# Patient Record
Sex: Male | Born: 1945 | Race: White | Hispanic: No | Smoking: Current every day smoker
Health system: Southern US, Community
[De-identification: ages and names within clinical notes are randomized; demographics above are authoritative.]

## PROBLEM LIST (undated history)

## (undated) DIAGNOSIS — J449 Chronic obstructive pulmonary disease, unspecified: Secondary | ICD-10-CM

## (undated) HISTORY — PX: HAND SURGERY: SHX662

---

## 1999-10-29 ENCOUNTER — Ambulatory Visit (HOSPITAL_BASED_OUTPATIENT_CLINIC_OR_DEPARTMENT_OTHER): Admission: RE | Admit: 1999-10-29 | Discharge: 1999-10-29 | Payer: Self-pay | Admitting: Orthopedic Surgery

## 2001-06-19 ENCOUNTER — Ambulatory Visit (HOSPITAL_COMMUNITY): Admission: RE | Admit: 2001-06-19 | Discharge: 2001-06-19 | Payer: Self-pay | Admitting: Family Medicine

## 2001-06-19 ENCOUNTER — Encounter: Payer: Self-pay | Admitting: Family Medicine

## 2011-11-18 ENCOUNTER — Institutional Professional Consult (permissible substitution): Payer: Self-pay | Admitting: Pulmonary Disease

## 2016-03-18 ENCOUNTER — Encounter (HOSPITAL_COMMUNITY): Payer: Self-pay | Admitting: *Deleted

## 2016-03-18 ENCOUNTER — Emergency Department (HOSPITAL_COMMUNITY)
Admission: EM | Admit: 2016-03-18 | Discharge: 2016-03-18 | Discharge status: Against Medical Advice | Payer: Medicare Other | Attending: Emergency Medicine | Admitting: Emergency Medicine

## 2016-03-18 ENCOUNTER — Emergency Department (HOSPITAL_COMMUNITY): Payer: Medicare Other

## 2016-03-18 DIAGNOSIS — R000 Tachycardia, unspecified: Secondary | ICD-10-CM | POA: Insufficient documentation

## 2016-03-18 DIAGNOSIS — Z5321 Procedure and treatment not carried out due to patient leaving prior to being seen by health care provider: Secondary | ICD-10-CM | POA: Diagnosis not present

## 2016-03-18 DIAGNOSIS — Z5320 Procedure and treatment not carried out because of patient's decision for unspecified reasons: Secondary | ICD-10-CM

## 2016-03-18 DIAGNOSIS — F17200 Nicotine dependence, unspecified, uncomplicated: Secondary | ICD-10-CM | POA: Diagnosis not present

## 2016-03-18 DIAGNOSIS — Z7951 Long term (current) use of inhaled steroids: Secondary | ICD-10-CM | POA: Diagnosis not present

## 2016-03-18 DIAGNOSIS — J441 Chronic obstructive pulmonary disease with (acute) exacerbation: Secondary | ICD-10-CM | POA: Diagnosis not present

## 2016-03-18 DIAGNOSIS — Z5329 Procedure and treatment not carried out because of patient's decision for other reasons: Secondary | ICD-10-CM

## 2016-03-18 DIAGNOSIS — E872 Acidosis, unspecified: Secondary | ICD-10-CM

## 2016-03-18 DIAGNOSIS — R0602 Shortness of breath: Secondary | ICD-10-CM | POA: Diagnosis present

## 2016-03-18 DIAGNOSIS — R0603 Acute respiratory distress: Secondary | ICD-10-CM

## 2016-03-18 DIAGNOSIS — Z79899 Other long term (current) drug therapy: Secondary | ICD-10-CM | POA: Insufficient documentation

## 2016-03-18 DIAGNOSIS — J705 Respiratory conditions due to smoke inhalation: Secondary | ICD-10-CM

## 2016-03-18 DIAGNOSIS — T59811A Toxic effect of smoke, accidental (unintentional), initial encounter: Secondary | ICD-10-CM

## 2016-03-18 HISTORY — DX: Chronic obstructive pulmonary disease, unspecified: J44.9

## 2016-03-18 LAB — PREPARE FRESH FROZEN PLASMA
UNIT DIVISION: 0
Unit division: 0

## 2016-03-18 LAB — COMPREHENSIVE METABOLIC PANEL
ALBUMIN: 4 g/dL (ref 3.5–5.0)
ALK PHOS: 109 U/L (ref 38–126)
ALT: 31 U/L (ref 17–63)
ANION GAP: 13 (ref 5–15)
AST: 24 U/L (ref 15–41)
BUN: 19 mg/dL (ref 6–20)
CALCIUM: 9.1 mg/dL (ref 8.9–10.3)
CO2: 23 mmol/L (ref 22–32)
Chloride: 103 mmol/L (ref 101–111)
Creatinine, Ser: 0.9 mg/dL (ref 0.61–1.24)
GFR calc Af Amer: >60 mL/min (ref >60)
GFR calc non Af Amer: >60 mL/min (ref >60)
GLUCOSE: 127 mg/dL — AB (ref 65–99)
Potassium: 4.4 mmol/L (ref 3.5–5.1)
SODIUM: 139 mmol/L (ref 135–145)
Total Bilirubin: 0.7 mg/dL (ref 0.3–1.2)
Total Protein: 7.7 g/dL (ref 6.5–8.1)

## 2016-03-18 LAB — TYPE AND SCREEN
ABO/RH(D): O NEG
ANTIBODY SCREEN: NEGATIVE
UNIT DIVISION: 0
Unit division: 0

## 2016-03-18 LAB — CBC WITH DIFFERENTIAL/PLATELET
BASOS PCT: 0 %
Basophils Absolute: 0 10*3/uL (ref 0.0–0.1)
EOS ABS: 0.7 10*3/uL (ref 0.0–0.7)
Eosinophils Relative: 7 %
HCT: 51.1 % (ref 39.0–52.0)
HEMOGLOBIN: 16.9 g/dL (ref 13.0–17.0)
Lymphocytes Relative: 24 %
Lymphs Abs: 2.4 10*3/uL (ref 0.7–4.0)
MCH: 33.7 pg (ref 26.0–34.0)
MCHC: 33.1 g/dL (ref 30.0–36.0)
MCV: 101.8 fL — ABNORMAL HIGH (ref 78.0–100.0)
Monocytes Absolute: 1 10*3/uL (ref 0.1–1.0)
Monocytes Relative: 10 %
NEUTROS PCT: 59 %
Neutro Abs: 6.2 10*3/uL (ref 1.7–7.7)
Platelets: 269 10*3/uL (ref 150–400)
RBC: 5.02 MIL/uL (ref 4.22–5.81)
RDW: 14.2 % (ref 11.5–15.5)
WBC: 10.3 10*3/uL (ref 4.0–10.5)

## 2016-03-18 LAB — CARBOXYHEMOGLOBIN
CARBOXYHEMOGLOBIN: 7.1 % — AB (ref 0.5–1.5)
Methemoglobin: 0.9 % (ref 0.0–1.5)
O2 SAT: 90.7 %
Total hemoglobin: 16.8 g/dL (ref 13.5–18.0)

## 2016-03-18 LAB — I-STAT CG4 LACTIC ACID, ED: Lactic Acid, Venous: 2.6 mmol/L (ref 0.5–2.0)

## 2016-03-18 LAB — ABO/RH: ABO/RH(D): O NEG

## 2016-03-18 MED ORDER — DOXYCYCLINE HYCLATE 100 MG PO CAPS: 100.0000 mg | ORAL_CAPSULE | Freq: Two times a day (BID) | ORAL | Status: DC

## 2016-03-18 MED ORDER — ALBUTEROL (5 MG/ML) CONTINUOUS INHALATION SOLN
10.0000 mg/h | INHALATION_SOLUTION | Freq: Once | RESPIRATORY_TRACT | Status: AC
  Administered 2016-03-18: 10 mg/h via RESPIRATORY_TRACT
  Filled 2016-03-18: qty 20

## 2016-03-18 MED ORDER — MAGNESIUM SULFATE 2 GM/50ML IV SOLN
2.0000 g | Freq: Once | INTRAVENOUS | Status: AC
  Administered 2016-03-18: 2 g via INTRAVENOUS
  Filled 2016-03-18: qty 50

## 2016-03-18 MED ORDER — SODIUM CHLORIDE 0.9 % IV BOLUS (SEPSIS)
1000.0000 mL | Freq: Once | INTRAVENOUS | Status: AC
  Administered 2016-03-18: 1000 mL via INTRAVENOUS

## 2016-03-18 MED ORDER — PREDNISONE 20 MG PO TABS: 60.0000 mg | ORAL_TABLET | Freq: Every day | ORAL | Status: DC

## 2016-03-18 MED ORDER — ALBUTEROL SULFATE HFA 108 (90 BASE) MCG/ACT IN AERS
2.0000 | INHALATION_SPRAY | Freq: Once | RESPIRATORY_TRACT | Status: AC
Start: 2016-03-18 — End: 2016-03-18
  Administered 2016-03-18: 2 via RESPIRATORY_TRACT
  Filled 2016-03-18: qty 6.7

## 2016-03-18 MED ORDER — DOXYCYCLINE HYCLATE 100 MG PO TABS
100.0000 mg | ORAL_TABLET | Freq: Once | ORAL | Status: AC
  Administered 2016-03-18: 100 mg via ORAL
  Filled 2016-03-18: qty 1

## 2016-03-18 MED ORDER — IPRATROPIUM BROMIDE 0.02 % IN SOLN
1.5000 mg | Freq: Once | RESPIRATORY_TRACT | Status: AC
  Administered 2016-03-18: 1.5 mg via RESPIRATORY_TRACT
  Filled 2016-03-18: qty 7.5

## 2016-03-18 MED ORDER — METHYLPREDNISOLONE SODIUM SUCC 125 MG IJ SOLR
125.0000 mg | Freq: Once | INTRAMUSCULAR | Status: AC
  Administered 2016-03-18: 125 mg via INTRAVENOUS
  Filled 2016-03-18: qty 2

## 2016-03-18 MED ORDER — ALBUTEROL SULFATE HFA 108 (90 BASE) MCG/ACT IN AERS: 1.0000 | INHALATION_SPRAY | Freq: Four times a day (QID) | RESPIRATORY_TRACT | Status: DC | PRN

## 2016-03-18 MED ORDER — ALBUTEROL SULFATE (2.5 MG/3ML) 0.083% IN NEBU
5.0000 mg | INHALATION_SOLUTION | Freq: Once | RESPIRATORY_TRACT | Status: AC
  Administered 2016-03-18: 5 mg via RESPIRATORY_TRACT
  Filled 2016-03-18: qty 6

## 2016-03-18 NOTE — ED Notes (Signed)
Patient stated he was ready to go.  Dr Bland Span notified and went to talk with the patient   Patient instructed this nurse to call to call D R Tamala Julian to come get him
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
## 2016-03-18 NOTE — Progress Notes (Signed)
Removed patient from BiPAP per Dr. Bland Span.
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
## 2016-03-18 NOTE — ED Notes (Signed)
Dr Ellender Hose aware of critical carboxyhgb. 7.1

## 2017-01-01 ENCOUNTER — Encounter (HOSPITAL_COMMUNITY): Payer: Self-pay | Admitting: Emergency Medicine

## 2017-01-01 ENCOUNTER — Emergency Department (HOSPITAL_COMMUNITY): Payer: Medicare Other

## 2017-01-01 ENCOUNTER — Emergency Department (HOSPITAL_COMMUNITY)
Admission: EM | Admit: 2017-01-01 | Discharge: 2017-01-01 | Disposition: A | Discharge status: Home / Self Care | Payer: Medicare Other | Attending: Emergency Medicine | Admitting: Emergency Medicine

## 2017-01-01 DIAGNOSIS — J440 Chronic obstructive pulmonary disease with acute lower respiratory infection: Secondary | ICD-10-CM | POA: Insufficient documentation

## 2017-01-01 DIAGNOSIS — Z79899 Other long term (current) drug therapy: Secondary | ICD-10-CM | POA: Insufficient documentation

## 2017-01-01 DIAGNOSIS — R0902 Hypoxemia: Secondary | ICD-10-CM | POA: Diagnosis not present

## 2017-01-01 DIAGNOSIS — F17210 Nicotine dependence, cigarettes, uncomplicated: Secondary | ICD-10-CM | POA: Diagnosis not present

## 2017-01-01 DIAGNOSIS — R0602 Shortness of breath: Secondary | ICD-10-CM

## 2017-01-01 DIAGNOSIS — J209 Acute bronchitis, unspecified: Secondary | ICD-10-CM

## 2017-01-01 LAB — BASIC METABOLIC PANEL
ANION GAP: 8 (ref 5–15)
BUN: 19 mg/dL (ref 6–20)
CALCIUM: 9.3 mg/dL (ref 8.9–10.3)
CHLORIDE: 102 mmol/L (ref 101–111)
CO2: 25 mmol/L (ref 22–32)
CREATININE: 0.77 mg/dL (ref 0.61–1.24)
GFR calc non Af Amer: >60 mL/min (ref >60)
Glucose, Bld: 115 mg/dL — ABNORMAL HIGH (ref 65–99)
Potassium: 4.6 mmol/L (ref 3.5–5.1)
SODIUM: 135 mmol/L (ref 135–145)

## 2017-01-01 LAB — CBC WITH DIFFERENTIAL/PLATELET
BASOS ABS: 0 10*3/uL (ref 0.0–0.1)
BASOS PCT: 1 %
EOS ABS: 0.5 10*3/uL (ref 0.0–0.7)
Eosinophils Relative: 7 %
HCT: 51.6 % (ref 39.0–52.0)
HEMOGLOBIN: 17.7 g/dL — AB (ref 13.0–17.0)
Lymphocytes Relative: 15 %
Lymphs Abs: 1 10*3/uL (ref 0.7–4.0)
MCH: 34 pg (ref 26.0–34.0)
MCHC: 34.3 g/dL (ref 30.0–36.0)
MCV: 99.2 fL (ref 78.0–100.0)
Monocytes Absolute: 0.8 10*3/uL (ref 0.1–1.0)
Monocytes Relative: 13 %
NEUTROS ABS: 4.2 10*3/uL (ref 1.7–7.7)
Neutrophils Relative %: 64 %
Platelets: 236 10*3/uL (ref 150–400)
RBC: 5.2 MIL/uL (ref 4.22–5.81)
RDW: 13.2 % (ref 11.5–15.5)
WBC: 6.5 10*3/uL (ref 4.0–10.5)

## 2017-01-01 LAB — BRAIN NATRIURETIC PEPTIDE: B NATRIURETIC PEPTIDE 5: 15 pg/mL (ref 0.0–100.0)

## 2017-01-01 MED ORDER — ALBUTEROL SULFATE (2.5 MG/3ML) 0.083% IN NEBU: 2.5000 mg | INHALATION_SOLUTION | RESPIRATORY_TRACT | 1 refills | Status: DC | PRN

## 2017-01-01 MED ORDER — IPRATROPIUM-ALBUTEROL 0.5-2.5 (3) MG/3ML IN SOLN
3.0000 mL | Freq: Once | RESPIRATORY_TRACT | Status: AC
  Administered 2017-01-01: 3 mL via RESPIRATORY_TRACT
  Filled 2017-01-01: qty 3

## 2017-01-01 MED ORDER — ALBUTEROL (5 MG/ML) CONTINUOUS INHALATION SOLN
10.0000 mg/h | INHALATION_SOLUTION | Freq: Once | RESPIRATORY_TRACT | Status: AC
  Administered 2017-01-01: 10 mg/h via RESPIRATORY_TRACT

## 2017-01-01 MED ORDER — PREDNISONE 20 MG PO TABS: ORAL_TABLET | ORAL | 0 refills | Status: DC

## 2017-01-01 MED ORDER — METHYLPREDNISOLONE SODIUM SUCC 125 MG IJ SOLR
80.0000 mg | Freq: Once | INTRAMUSCULAR | Status: AC
  Administered 2017-01-01: 80 mg via INTRAVENOUS
  Filled 2017-01-01: qty 2

## 2017-01-01 MED ORDER — BUDESONIDE-FORMOTEROL FUMARATE 80-4.5 MCG/ACT IN AERO: 2.0000 | INHALATION_SPRAY | Freq: Two times a day (BID) | RESPIRATORY_TRACT | 3 refills | Status: DC

## 2017-01-01 MED ORDER — IPRATROPIUM BROMIDE 0.02 % IN SOLN
0.5000 mg | Freq: Once | RESPIRATORY_TRACT | Status: AC
  Administered 2017-01-01: 0.5 mg via RESPIRATORY_TRACT
  Filled 2017-01-01: qty 2.5

## 2017-01-01 MED ORDER — ALBUTEROL SULFATE HFA 108 (90 BASE) MCG/ACT IN AERS: 1.0000 | INHALATION_SPRAY | Freq: Four times a day (QID) | RESPIRATORY_TRACT | 0 refills | Status: DC | PRN

## 2017-01-01 MED ORDER — IPRATROPIUM BROMIDE HFA 17 MCG/ACT IN AERS: 2.0000 | INHALATION_SPRAY | Freq: Four times a day (QID) | RESPIRATORY_TRACT | 2 refills | Status: DC

## 2017-01-01 NOTE — ED Provider Notes (Signed)
[facility] DEPT Provider Note   CSN: [id number] Arrival date & time: 01/01/17  [id number]  By signing my name below, I, Jaquelyn Bitter., attest that this documentation has been prepared under the direction and in the presence of Elnora Morrison, MD. Electronically signed: Jaquelyn Bitter., ED Scribe. 01/01/17. 10:50 AM.    History   Chief Complaint Chief Complaint  Patient presents with  . Shortness of Breath    HPI  Corey Griffin is a 71 y.o. male with hx of chronic bronchitis (1976) who presents to the Emergency Department complaining of moderate SOB with sudden onset x4 days. Pt states that he ran out of his bronchitis medication on Thursday and has been SOB since. He states that he has been using his wife's concentrator on 5-6L O2 until he can catch his breath and then he turns it down to 2L. Pt has used nasal saline, nebulizer and albuterol with mild relief. He denies cough, fever, chest pain, leg swelling, hx of blood clot in lungs/legs, hx of underlying heart condition. Of note, pt reports hx of smoking but is not a current smoker. He is seeking a medication refill until he can schedule an appointment with his PCP next week.   The history is provided by the patient. No language interpreter was used.    Past Medical History:  Diagnosis Date  . COPD (chronic obstructive pulmonary disease) (HCC)     There are no active problems to display for this patient.   Past Surgical History:  Procedure Laterality Date  . HAND SURGERY         Home Medications    Prior to Admission medications   Medication Sig Start Date End Date Taking? Authorizing Provider  albuterol (PROVENTIL HFA;VENTOLIN HFA) 108 (90 Base) MCG/ACT inhaler Inhale 1 puff into the lungs every 6 (six) hours as needed for wheezing or shortness of breath.   Yes Historical Provider, MD  albuterol (PROVENTIL) (2.5 MG/3ML) 0.083% nebulizer solution Take 2.5 mg by nebulization every 6 (six) hours as  needed for wheezing or shortness of breath.   Yes Historical Provider, MD  budesonide-formoterol (SYMBICORT) 80-4.5 MCG/ACT inhaler Inhale 2 puffs into the lungs 2 (two) times daily.   Yes Historical Provider, MD  ipratropium (ATROVENT HFA) 17 MCG/ACT inhaler Inhale 2 puffs into the lungs every 6 (six) hours.   Yes Historical Provider, MD  albuterol (PROVENTIL HFA;VENTOLIN HFA) 108 (90 Base) MCG/ACT inhaler Inhale 1-2 puffs into the lungs every 6 (six) hours as needed for wheezing or shortness of breath. 01/01/17   Elnora Morrison, MD  budesonide-formoterol (SYMBICORT) 80-4.5 MCG/ACT inhaler Inhale 2 puffs into the lungs 2 (two) times daily. 01/01/17   Elnora Morrison, MD  predniSONE (DELTASONE) 20 MG tablet 3 tabs po day one, then 2 po daily x 4 days 01/01/17   Elnora Morrison, MD    Family History History reviewed. No pertinent family history.  Social History Social History  Substance Use Topics  . Smoking status: Current Every Day Smoker    Packs/day: 1.00    Years: 50.00    Types: Cigarettes  . Smokeless tobacco: Never Used  . Alcohol use Yes     Allergies   Patient has no known allergies.   Review of Systems Review of Systems  Constitutional: Negative for fever.  Respiratory: Positive for cough and shortness of breath.   Cardiovascular: Negative for chest pain and leg swelling.  All other systems reviewed and are negative.    Physical Exam  Updated Vital Signs BP 139/83   Pulse 78   Temp 97.6 F (36.4 C) (Oral)   Resp 21   Ht 5\' 11"  (1.803 m)   Wt 170 lb (77.1 kg)   SpO2 95%   BMI 23.71 kg/m   Physical Exam  Constitutional: He appears well-developed and well-nourished.  HENT:  Head: Normocephalic and atraumatic.  Mouth/Throat: Mucous membranes are dry.  Eyes: Conjunctivae are normal.  Neck: Neck supple.  Cardiovascular: Normal rate and regular rhythm.   Pulmonary/Chest: No respiratory distress. He has decreased breath sounds. He has wheezes.  Decreased air movement  bilaterally, mild increased work of breathing. Nasal cannula in place on 3L O2.   Abdominal: Soft. There is no tenderness.  Musculoskeletal: He exhibits no edema.       Right lower leg: He exhibits no edema.       Left lower leg: He exhibits no edema.  No lower extremity edema.  Neurological: He is alert.  Skin: Skin is warm and dry.  Psychiatric: He has a normal mood and affect.  Nursing note and vitals reviewed.    ED Treatments / Results   DIAGNOSTIC STUDIES: Oxygen Saturation is 92% on 3L O2 via nasal cannula, inadequate by my interpretation.   COORDINATION OF CARE: 10:50 AM-Discussed next steps with pt. Pt verbalized understanding and is agreeable with the plan.    Labs (all labs ordered are listed, but only abnormal results are displayed) Labs Reviewed  CBC WITH DIFFERENTIAL/PLATELET - Abnormal; Notable for the following:       Result Value   Hemoglobin 17.7 (*)    All other components within normal limits  BASIC METABOLIC PANEL - Abnormal; Notable for the following:    Glucose, Bld 115 (*)    All other components within normal limits  BRAIN NATRIURETIC PEPTIDE    EKG  EKG Interpretation  Date/Time:  Sunday January 01 2017 07:57:05 EST Ventricular Rate:  100 PR Interval:    QRS Duration: 100 QT Interval:  334 QTC Calculation: 431 R Axis:   -82 Text Interpretation:  Sinus tachycardia Right atrial enlargement Incomplete RBBB and LAFB Artifact, no acute ST elevation Confirmed by Reather Converse MD, Amarrah Meinhart [phone]) on 01/01/2017 8:37:51 AM       Radiology Dg Chest Port 1 View  Result Date: 01/01/2017 CLINICAL DATA:  Shortness of breath. EXAM: PORTABLE CHEST 1 VIEW COMPARISON:  None. FINDINGS: Increased symmetric density at the first costochondral junctions is likely degenerative. No pneumothorax. The heart size is normal. Increased interstitial markings in the lungs without overt edema. The hila and mediastinum are normal. No other acute abnormalities. IMPRESSION: Mild  increased interstitial markings in the lungs without cardiomegaly. The findings could represent pulmonary venous congestion/ mild edema. However, the lack of cardiomegaly also raises the possibility of bronchitic change or atypical infection. Recommend clinical correlation. Electronically Signed   By: Dorise Bullion III M.D   On: 01/01/2017 08:18    Procedures Procedures (including critical care time)  Medications Ordered in ED Medications  ipratropium-albuterol (DUONEB) 0.5-2.5 (3) MG/3ML nebulizer solution 3 mL (3 mLs Nebulization Given 01/01/17 0821)  methylPREDNISolone sodium succinate (SOLU-MEDROL) 125 mg/2 mL injection 80 mg (80 mg Intravenous Given 01/01/17 0925)  ipratropium (ATROVENT) nebulizer solution 0.5 mg (0.5 mg Nebulization Given 01/01/17 1002)  albuterol (PROVENTIL,VENTOLIN) solution continuous neb (10 mg/hr Nebulization Given 01/01/17 1002)     Initial Impression / Assessment and Plan / ED Course  I have reviewed the triage vital signs and the nursing notes.  Pertinent  labs & imaging results that were available during my care of the patient were reviewed by me and considered in my medical decision making (see chart for details).   patient presents with clinical concern for acute COPD/bronchitis exacerbation. Patient is requiring 2 L nasal cannula for which he is using left over machine from his wife recently is deceased. Discussed this is likely lung pathology however patient needs further workup such as echocardiogram and evaluation. Patient is very stubborn and does not want to come the hospital his capacity make decisions.  Patient wishes to follow-up outpatient. Plan refill medications steroids and discharge after nebulizer. Patient did improve after nebulizing the ER.  Results and differential diagnosis were discussed with the patient/parent/guardian. Xrays were independently reviewed by myself.  Close follow up outpatient was discussed, comfortable with the plan.    Medications  ipratropium-albuterol (DUONEB) 0.5-2.5 (3) MG/3ML nebulizer solution 3 mL (3 mLs Nebulization Given 01/01/17 0821)  methylPREDNISolone sodium succinate (SOLU-MEDROL) 125 mg/2 mL injection 80 mg (80 mg Intravenous Given 01/01/17 0925)  ipratropium (ATROVENT) nebulizer solution 0.5 mg (0.5 mg Nebulization Given 01/01/17 1002)  albuterol (PROVENTIL,VENTOLIN) solution continuous neb (10 mg/hr Nebulization Given 01/01/17 1002)    Vitals:   01/01/17 0935 01/01/17 1000 01/01/17 1002 01/01/17 1030  BP: 126/70 145/84  139/83  Pulse: 64 86  78  Resp: 25 26  21   Temp:      TempSrc:      SpO2: 93% 95% 95% 95%  Weight:      Height:        Final diagnoses:  Acute bronchitis with chronic obstructive pulmonary disease (COPD) (HCC)  Hypoxia     Final Clinical Impressions(s) / ED Diagnoses   Final diagnoses:  Acute bronchitis with chronic obstructive pulmonary disease (COPD) (HCC)  Hypoxia    New Prescriptions New Prescriptions   ALBUTEROL (PROVENTIL HFA;VENTOLIN HFA) 108 (90 BASE) MCG/ACT INHALER    Inhale 1-2 puffs into the lungs every 6 (six) hours as needed for wheezing or shortness of breath.   BUDESONIDE-FORMOTEROL (SYMBICORT) 80-4.5 MCG/ACT INHALER    Inhale 2 puffs into the lungs 2 (two) times daily.   PREDNISONE (DELTASONE) 20 MG TABLET    3 tabs po day one, then 2 po daily x 4 days      Elnora Morrison, MD 01/01/17 1051

## 2017-01-01 NOTE — Discharge Instructions (Signed)
If you were given medicines take as directed.  If you are on coumadin or contraceptives realize their levels and effectiveness is altered by many different medicines.  If you have any reaction (rash, tongues swelling, other) to the medicines stop taking and see a physician.    If your blood pressure was elevated in the ER make sure you follow up for management with a primary doctor or return for chest pain, shortness of breath or stroke symptoms.  Please follow up as directed and return to the ER or see a physician for new or worsening symptoms.  Thank you. Vitals:   01/01/17 0900 01/01/17 0930 01/01/17 0935 01/01/17 1002  BP: 131/82 129/86 126/70   Pulse: 99 95 64   Resp: 15 26 25    Temp:      TempSrc:      SpO2: (!) 89% 93% 93% 95%  Weight:      Height:

## 2017-01-01 NOTE — ED Triage Notes (Signed)
Patient c/o shortness of breath since Wednesday. Patient states that he has bronchitis and has ran out of inhalers and neb treatments at home. Denies any coughing, fevers, or chest pain.

## 2017-02-02 ENCOUNTER — Ambulatory Visit (HOSPITAL_COMMUNITY)
Admission: RE | Admit: 2017-02-02 | Discharge: 2017-02-02 | Disposition: A | Discharge status: Home / Self Care | Payer: Medicare Other | Source: Ambulatory Visit | Attending: Family Medicine | Admitting: Family Medicine

## 2017-02-02 ENCOUNTER — Other Ambulatory Visit (HOSPITAL_COMMUNITY): Payer: Self-pay | Admitting: Family Medicine

## 2017-02-02 DIAGNOSIS — M8588 Other specified disorders of bone density and structure, other site: Secondary | ICD-10-CM | POA: Insufficient documentation

## 2017-02-02 DIAGNOSIS — M545 Low back pain: Secondary | ICD-10-CM

## 2017-02-02 DIAGNOSIS — M4316 Spondylolisthesis, lumbar region: Secondary | ICD-10-CM | POA: Diagnosis not present

## 2017-03-08 ENCOUNTER — Other Ambulatory Visit (HOSPITAL_COMMUNITY): Payer: Self-pay | Admitting: Family Medicine

## 2017-03-08 ENCOUNTER — Other Ambulatory Visit: Payer: Self-pay | Admitting: Family Medicine

## 2017-03-08 DIAGNOSIS — M545 Low back pain: Secondary | ICD-10-CM

## 2017-03-14 ENCOUNTER — Encounter (HOSPITAL_COMMUNITY): Payer: Self-pay

## 2017-03-14 ENCOUNTER — Ambulatory Visit (HOSPITAL_COMMUNITY): Payer: Medicare Other

## 2017-04-04 ENCOUNTER — Ambulatory Visit (HOSPITAL_COMMUNITY)
Admission: RE | Admit: 2017-04-04 | Discharge: 2017-04-04 | Disposition: A | Discharge status: Home / Self Care | Payer: Medicare Other | Source: Ambulatory Visit | Attending: Family Medicine | Admitting: Family Medicine

## 2017-04-04 DIAGNOSIS — M4856XA Collapsed vertebra, not elsewhere classified, lumbar region, initial encounter for fracture: Secondary | ICD-10-CM | POA: Insufficient documentation

## 2017-04-04 DIAGNOSIS — M545 Low back pain: Secondary | ICD-10-CM

## 2017-04-04 DIAGNOSIS — M5126 Other intervertebral disc displacement, lumbar region: Secondary | ICD-10-CM | POA: Insufficient documentation

## 2017-04-04 DIAGNOSIS — M4316 Spondylolisthesis, lumbar region: Secondary | ICD-10-CM | POA: Diagnosis not present

## 2017-04-20 ENCOUNTER — Ambulatory Visit: Payer: Self-pay | Admitting: General Surgery

## 2017-09-21 ENCOUNTER — Encounter: Payer: Self-pay | Admitting: General Surgery

## 2017-09-21 ENCOUNTER — Ambulatory Visit (INDEPENDENT_AMBULATORY_CARE_PROVIDER_SITE_OTHER): Payer: Medicare Other | Admitting: General Surgery

## 2017-09-21 VITALS — BP 158/87 | HR 109 | Temp 98.4°F | Resp 18 | Ht 68.0 in | Wt 152.0 lb

## 2017-09-21 DIAGNOSIS — K4090 Unilateral inguinal hernia, without obstruction or gangrene, not specified as recurrent: Secondary | ICD-10-CM

## 2017-09-21 NOTE — Progress Notes (Signed)
Corey Griffin; [id number]; 04/09/1946   HPI Patient is a 71 year old white male who was referred to my care by Dr. Lorriane Shire for evaluation treatment of a left inguinal hernia. The patient states he has had a left inguinal hernia for some time now, but is increasing in size and is causing him discomfort. It is made worse with coughing. He states he does have a chronic cough secondary to bronchitis. He also complains about an abdominal wall hernia. He currently has a pain of 2 out of 10. The inguinal hernia resolves when lying down. Past Medical History:  Diagnosis Date  . COPD (chronic obstructive pulmonary disease) ([place])     Past Surgical History:  Procedure Laterality Date  . HAND SURGERY      History reviewed. No pertinent family history.  Current Outpatient Prescriptions on File Prior to Visit  Medication Sig Dispense Refill  . albuterol (PROVENTIL HFA;VENTOLIN HFA) 108 (90 Base) MCG/ACT inhaler Inhale 1 puff into the lungs every 6 (six) hours as needed for wheezing or shortness of breath.    Marland Kitchen albuterol (PROVENTIL HFA;VENTOLIN HFA) 108 (90 Base) MCG/ACT inhaler Inhale 1-2 puffs into the lungs every 6 (six) hours as needed for wheezing or shortness of breath. 1 Inhaler 0  . albuterol (PROVENTIL) (2.5 MG/3ML) 0.083% nebulizer solution Take 3 mLs (2.5 mg total) by nebulization every 4 (four) hours as needed for wheezing or shortness of breath. 75 mL 1  . budesonide-formoterol (SYMBICORT) 80-4.5 MCG/ACT inhaler Inhale 2 puffs into the lungs 2 (two) times daily.    . budesonide-formoterol (SYMBICORT) 80-4.5 MCG/ACT inhaler Inhale 2 puffs into the lungs 2 (two) times daily. 1 Inhaler 3  . ipratropium (ATROVENT HFA) 17 MCG/ACT inhaler Inhale 2 puffs into the lungs every 6 (six) hours. 1 Inhaler 2  . predniSONE (DELTASONE) 20 MG tablet 3 tabs po day one, then 2 po daily x 4 days 11 tablet 0   No current facility-administered medications on file prior to visit.     No Known  Allergies  History  Alcohol Use  . Yes    History  Smoking Status  . Current Every Day Smoker  . Packs/day: 1.00  . Years: 50.00  . Types: Cigarettes  Smokeless Tobacco  . Never Used    Review of Systems  Constitutional: Negative.   HENT: Positive for sinus pain.   Eyes: Negative.   Respiratory: Positive for cough, shortness of breath and wheezing.   Cardiovascular: Negative.   Gastrointestinal: Positive for abdominal pain.  Genitourinary: Negative.   Musculoskeletal: Negative.   Skin: Negative.   Neurological: Negative.   Endo/Heme/Allergies: Negative.   Psychiatric/Behavioral: Negative.     Objective   Vitals:   09/21/17 1119  BP: (!) 158/87  Pulse: (!) 109  Resp: 18  Temp: 98.4 F (36.9 C)    Physical Exam  Constitutional: He is oriented to person, place, and time and well-developed, well-nourished, and in no distress.  HENT:  Head: Normocephalic and atraumatic.  Cardiovascular: Normal rate and regular rhythm.  Exam reveals no gallop and no friction rub.   No murmur heard. Pulmonary/Chest: Effort normal. No respiratory distress. He has wheezes. He has no rales.  Does have upper respiratory wheezing noted.  Abdominal: Soft. Bowel sounds are normal. He exhibits no distension. There is no tenderness. There is no rebound.  A large laxity of the upper abdominal wall is noted. I could not feel a specific hernia defect. A reducible left inguinal hernia is noted. A right inguinal  floor laxity is noted.  Neurological: He is alert and oriented to person, place, and time.  Skin: Skin is warm and dry.  Vitals reviewed.   Assessment  Left inguinal hernia Laxity of abdominal wall. I told the patient that I did not specifically feel a hernia, but he would have to be evaluated at a tertiary care center for possible reconstructive abdominal surgery should he desire. I did tell him that I thought his chronic bronchitis may be a limiting factor in repairing this. Plan    Patient would like to proceed with a left inguinal herniorrhaphy with mesh under spinal anesthesia on 11/17/2017. The risks and benefits of the procedure including bleeding, infection, pulmonary difficulties, and the possibility of recurrence of the hernia were fully explained to the patient, who gave informed consent.

## 2017-09-21 NOTE — Patient Instructions (Signed)

## 2018-05-15 ENCOUNTER — Other Ambulatory Visit (HOSPITAL_COMMUNITY): Payer: Self-pay | Admitting: Family Medicine

## 2018-05-15 DIAGNOSIS — K439 Ventral hernia without obstruction or gangrene: Secondary | ICD-10-CM

## 2018-05-23 ENCOUNTER — Ambulatory Visit (HOSPITAL_COMMUNITY)
Admission: RE | Admit: 2018-05-23 | Discharge: 2018-05-23 | Disposition: A | Discharge status: Home / Self Care | Payer: Medicare Other | Source: Ambulatory Visit | Attending: Family Medicine | Admitting: Family Medicine

## 2018-05-23 DIAGNOSIS — K439 Ventral hernia without obstruction or gangrene: Secondary | ICD-10-CM

## 2018-05-23 DIAGNOSIS — K4090 Unilateral inguinal hernia, without obstruction or gangrene, not specified as recurrent: Secondary | ICD-10-CM | POA: Insufficient documentation

## 2018-05-23 DIAGNOSIS — K8020 Calculus of gallbladder without cholecystitis without obstruction: Secondary | ICD-10-CM | POA: Insufficient documentation

## 2018-05-23 DIAGNOSIS — R109 Unspecified abdominal pain: Secondary | ICD-10-CM | POA: Insufficient documentation

## 2018-05-23 DIAGNOSIS — N400 Enlarged prostate without lower urinary tract symptoms: Secondary | ICD-10-CM | POA: Diagnosis not present

## 2018-05-23 DIAGNOSIS — N200 Calculus of kidney: Secondary | ICD-10-CM | POA: Diagnosis not present

## 2018-05-23 LAB — POCT I-STAT CREATININE: CREATININE: 0.8 mg/dL (ref 0.61–1.24)

## 2018-05-23 MED ORDER — IOPAMIDOL (ISOVUE-300) INJECTION 61%
100.0000 mL | Freq: Once | INTRAVENOUS | Status: AC | PRN
  Administered 2018-05-23: 100 mL via INTRAVENOUS

## 2018-06-08 ENCOUNTER — Encounter: Payer: Self-pay | Admitting: Internal Medicine

## 2018-07-02 ENCOUNTER — Encounter: Payer: Self-pay | Admitting: Gastroenterology

## 2018-08-27 ENCOUNTER — Ambulatory Visit (INDEPENDENT_AMBULATORY_CARE_PROVIDER_SITE_OTHER): Payer: Medicare Other | Admitting: Gastroenterology

## 2018-08-27 VITALS — BP 132/80 | HR 90 | Ht 68.0 in | Wt 164.0 lb

## 2018-08-27 DIAGNOSIS — R140 Abdominal distension (gaseous): Secondary | ICD-10-CM

## 2018-08-27 DIAGNOSIS — Z9981 Dependence on supplemental oxygen: Secondary | ICD-10-CM | POA: Diagnosis not present

## 2018-08-27 DIAGNOSIS — R194 Change in bowel habit: Secondary | ICD-10-CM | POA: Diagnosis not present

## 2018-08-27 MED ORDER — LINACLOTIDE 290 MCG PO CAPS: 290.0000 ug | ORAL_CAPSULE | Freq: Every day | ORAL | 1 refills | Status: DC

## 2018-08-27 MED ORDER — SUPREP BOWEL PREP KIT 17.5-3.13-1.6 GM/177ML PO SOLN: ORAL | 0 refills | Status: DC

## 2018-08-27 NOTE — Progress Notes (Signed)
HPI :  72 year old male with history of COPD, renal stones, and chronic back pain, referred here by Lucia Gaskins, MD for changes in bowel habits.  The patient reports over the past 12-18 months he's experienced a change in his bowel habits. He reported previously having very irregular bowel habits, however over time he has reported progressive constipation. He initially used laxatives intermittently for episodes of constipation, however since May has required high doses of laxatives to allow him to evacuate himself. He is currently taking Linzess 290 mg tablet, he states he takes this upwards of twice a day. He has also been using milk of magnesia to help stimulate stools. He denies any blood in his stools, but does endorse some distention of his abdomen where he suspects he has a hernia of the abdominal wall. He also has a left inguinal hernia which is present. He is eating well, he denies any nausea or vomiting, he denies any weight loss. He is never had a prior colonoscopy. He has an uncle who had colon cancer.  He reports he does have some baseline requirement for oxygen at home he sleeps with. He is using oxycodone 5 mg 4 times a day for low back pain over the past 1-2 years. He reports his bowel habits she does not think is related to narcotic use. He did have CT scan of his abdomen in June which showed some gallstones as well as renal stones and an enlarged prostate. PSA is normal.  CT scan 05/24/18 - sludge / stones in the gallbladder, renal stone left side 1.7cm and right sided renal stones, enlarged prostate, left inguinal hernia, marked thinning of abdominal musculature along anterior abdominal wall  Labs: 02/21/18 BUN 21, Cr 0.7, LFTs normal PSA normal 0.7  Past Medical History:  Diagnosis Date  . COPD (chronic obstructive pulmonary disease) (HCC)   low back pain osteoporosis   Past Surgical History:  Procedure Laterality Date  . HAND SURGERY     No family history on  file. Social History   Tobacco Use  . Smoking status: Current Every Day Smoker    Packs/day: 1.00    Years: 50.00    Pack years: 50.00    Types: Cigarettes  . Smokeless tobacco: Never Used  Substance Use Topics  . Alcohol use: Yes  . Drug use: No   Current Outpatient Medications  Medication Sig Dispense Refill  . albuterol (PROVENTIL HFA;VENTOLIN HFA) 108 (90 Base) MCG/ACT inhaler Inhale 1 puff into the lungs every 6 (six) hours as needed for wheezing or shortness of breath.    Marland Kitchen albuterol (PROVENTIL HFA;VENTOLIN HFA) 108 (90 Base) MCG/ACT inhaler Inhale 1-2 puffs into the lungs every 6 (six) hours as needed for wheezing or shortness of breath. 1 Inhaler 0  . albuterol (PROVENTIL) (2.5 MG/3ML) 0.083% nebulizer solution Take 3 mLs (2.5 mg total) by nebulization every 4 (four) hours as needed for wheezing or shortness of breath. 75 mL 1  . alendronate (FOSAMAX) 70 MG tablet Take 70 mg by mouth once a week. Take with a full glass of water on an empty stomach.    . budesonide-formoterol (SYMBICORT) 80-4.5 MCG/ACT inhaler Inhale 2 puffs into the lungs 2 (two) times daily.    . budesonide-formoterol (SYMBICORT) 80-4.5 MCG/ACT inhaler Inhale 2 puffs into the lungs 2 (two) times daily. 1 Inhaler 3  . ipratropium (ATROVENT HFA) 17 MCG/ACT inhaler Inhale 2 puffs into the lungs every 6 (six) hours. 1 Inhaler 2  . linaclotide (LINZESS) 290 MCG  CAPS capsule Take 290 mcg by mouth daily before breakfast.    . mometasone (NASONEX) 50 MCG/ACT nasal spray Place 2 sprays into the nose 2 (two) times daily.    . naproxen (NAPROSYN) 500 MG tablet Take 500 mg by mouth 2 (two) times daily with a meal.    . oxycodone (OXY-IR) 5 MG capsule Take 5 mg by mouth every 4 (four) hours as needed.    . tamsulosin (FLOMAX) 0.4 MG CAPS capsule Take 0.4 mg by mouth daily.     No current facility-administered medications for this visit.    No Known Allergies   Review of Systems: All systems reviewed and negative except  where noted in HPI.   Lab Results  Component Value Date   WBC 6.5 01/01/2017   HGB 17.7 (H) 01/01/2017   HCT 51.6 01/01/2017   MCV 99.2 01/01/2017   PLT 236 01/01/2017    Lab Results  Component Value Date   CREATININE 0.80 05/23/2018   BUN 19 01/01/2017   NA 135 01/01/2017   K 4.6 01/01/2017   CL 102 01/01/2017   CO2 25 01/01/2017   Lab Results  Component Value Date   ALT 31 03/18/2016   AST 24 03/18/2016   ALKPHOS 109 03/18/2016   BILITOT 0.7 03/18/2016     Physical Exam: BP 132/80   Pulse 90   Ht 5\' 8"  (1.727 m)   Wt 164 lb (74.4 kg)   BMI 24.94 kg/m  Constitutional: Pleasant,well-developed, male in no acute distress. HEENT: Normocephalic and atraumatic. Conjunctivae are normal. No scleral icterus. Neck supple.  Cardiovascular: Normal rate, regular rhythm.  Pulmonary/chest: Effort normal and breath sounds normal.  Abdominal: Soft, distension in the right mid to upper abdomen, nontender. There are no masses palpable. No hepatomegaly. Left inguinal hernia appreciated. Patient declined DRE Extremities: no edema Lymphadenopathy: No cervical adenopathy noted. Neurological: Alert and oriented to person place and time. Skin: Skin is warm and dry. No rashes noted. Psychiatric: Normal mood and affect. Behavior is normal.   ASSESSMENT AND PLAN: 72 year old male here for new patient assessment of the following:  Change in bowel habits / abdominal distention / oxygen dependant COPD - rather new and progressive symptoms over the past year or so. He is requiring a significant amount of laxatives to maintain bowel movements. He has never had a colonoscopy. I'm recommending optical colonoscopy in light of the symptoms and lack of prior screening, rule out polyps and mass lesions. I discussed risks and benefits of colonoscopy with him, given his oxygen dependence his procedure must be done at the hospital with anesthesia assistance. This was coordinated to be done for next week.  In the interim I'm recommending he take his Linzess only once daily, and supplement this with MiraLAX twice a day and can titrate as needed to produce bowel movements. I recommended a rectal exam in clinic today which he declined, preferring to wait until his colonoscopy next week.  He agreed with the plan, all questions answered.  Bloomfield Cellar, MD [facility]  CC: Lucia Gaskins, MD

## 2018-08-27 NOTE — H&P (View-Only) (Signed)
HPI :  72 year old male with history of COPD, renal stones, and chronic back pain, referred here by Lucia Gaskins, MD for changes in bowel habits.  The patient reports over the past 12-18 months he's experienced a change in his bowel habits. He reported previously having very irregular bowel habits, however over time he has reported progressive constipation. He initially used laxatives intermittently for episodes of constipation, however since May has required high doses of laxatives to allow him to evacuate himself. He is currently taking Linzess 290 mg tablet, he states he takes this upwards of twice a day. He has also been using milk of magnesia to help stimulate stools. He denies any blood in his stools, but does endorse some distention of his abdomen where he suspects he has a hernia of the abdominal wall. He also has a left inguinal hernia which is present. He is eating well, he denies any nausea or vomiting, he denies any weight loss. He is never had a prior colonoscopy. He has an uncle who had colon cancer.  He reports he does have some baseline requirement for oxygen at home he sleeps with. He is using oxycodone 5 mg 4 times a day for low back pain over the past 1-2 years. He reports his bowel habits she does not think is related to narcotic use. He did have CT scan of his abdomen in June which showed some gallstones as well as renal stones and an enlarged prostate. PSA is normal.  CT scan 05/24/18 - sludge / stones in the gallbladder, renal stone left side 1.7cm and right sided renal stones, enlarged prostate, left inguinal hernia, marked thinning of abdominal musculature along anterior abdominal wall  Labs: 02/21/18 BUN 21, Cr 0.7, LFTs normal PSA normal 0.7  Past Medical History:  Diagnosis Date  . COPD (chronic obstructive pulmonary disease) (HCC)   low back pain osteoporosis   Past Surgical History:  Procedure Laterality Date  . HAND SURGERY     No family history on  file. Social History   Tobacco Use  . Smoking status: Current Every Day Smoker    Packs/day: 1.00    Years: 50.00    Pack years: 50.00    Types: Cigarettes  . Smokeless tobacco: Never Used  Substance Use Topics  . Alcohol use: Yes  . Drug use: No   Current Outpatient Medications  Medication Sig Dispense Refill  . albuterol (PROVENTIL HFA;VENTOLIN HFA) 108 (90 Base) MCG/ACT inhaler Inhale 1 puff into the lungs every 6 (six) hours as needed for wheezing or shortness of breath.    Marland Kitchen albuterol (PROVENTIL HFA;VENTOLIN HFA) 108 (90 Base) MCG/ACT inhaler Inhale 1-2 puffs into the lungs every 6 (six) hours as needed for wheezing or shortness of breath. 1 Inhaler 0  . albuterol (PROVENTIL) (2.5 MG/3ML) 0.083% nebulizer solution Take 3 mLs (2.5 mg total) by nebulization every 4 (four) hours as needed for wheezing or shortness of breath. 75 mL 1  . alendronate (FOSAMAX) 70 MG tablet Take 70 mg by mouth once a week. Take with a full glass of water on an empty stomach.    . budesonide-formoterol (SYMBICORT) 80-4.5 MCG/ACT inhaler Inhale 2 puffs into the lungs 2 (two) times daily.    . budesonide-formoterol (SYMBICORT) 80-4.5 MCG/ACT inhaler Inhale 2 puffs into the lungs 2 (two) times daily. 1 Inhaler 3  . ipratropium (ATROVENT HFA) 17 MCG/ACT inhaler Inhale 2 puffs into the lungs every 6 (six) hours. 1 Inhaler 2  . linaclotide (LINZESS) 290 MCG  CAPS capsule Take 290 mcg by mouth daily before breakfast.    . mometasone (NASONEX) 50 MCG/ACT nasal spray Place 2 sprays into the nose 2 (two) times daily.    . naproxen (NAPROSYN) 500 MG tablet Take 500 mg by mouth 2 (two) times daily with a meal.    . oxycodone (OXY-IR) 5 MG capsule Take 5 mg by mouth every 4 (four) hours as needed.    . tamsulosin (FLOMAX) 0.4 MG CAPS capsule Take 0.4 mg by mouth daily.     No current facility-administered medications for this visit.    No Known Allergies   Review of Systems: All systems reviewed and negative except  where noted in HPI.   Lab Results  Component Value Date   WBC 6.5 01/01/2017   HGB 17.7 (H) 01/01/2017   HCT 51.6 01/01/2017   MCV 99.2 01/01/2017   PLT 236 01/01/2017    Lab Results  Component Value Date   CREATININE 0.80 05/23/2018   BUN 19 01/01/2017   NA 135 01/01/2017   K 4.6 01/01/2017   CL 102 01/01/2017   CO2 25 01/01/2017   Lab Results  Component Value Date   ALT 31 03/18/2016   AST 24 03/18/2016   ALKPHOS 109 03/18/2016   BILITOT 0.7 03/18/2016     Physical Exam: BP 132/80   Pulse 90   Ht 5\' 8"  (1.727 m)   Wt 164 lb (74.4 kg)   BMI 24.94 kg/m  Constitutional: Pleasant,well-developed, male in no acute distress. HEENT: Normocephalic and atraumatic. Conjunctivae are normal. No scleral icterus. Neck supple.  Cardiovascular: Normal rate, regular rhythm.  Pulmonary/chest: Effort normal and breath sounds normal.  Abdominal: Soft, distension in the right mid to upper abdomen, nontender. There are no masses palpable. No hepatomegaly. Left inguinal hernia appreciated. Patient declined DRE Extremities: no edema Lymphadenopathy: No cervical adenopathy noted. Neurological: Alert and oriented to person place and time. Skin: Skin is warm and dry. No rashes noted. Psychiatric: Normal mood and affect. Behavior is normal.   ASSESSMENT AND PLAN: 72 year old male here for new patient assessment of the following:  Change in bowel habits / abdominal distention / oxygen dependant COPD - rather new and progressive symptoms over the past year or so. He is requiring a significant amount of laxatives to maintain bowel movements. He has never had a colonoscopy. I'm recommending optical colonoscopy in light of the symptoms and lack of prior screening, rule out polyps and mass lesions. I discussed risks and benefits of colonoscopy with him, given his oxygen dependence his procedure must be done at the hospital with anesthesia assistance. This was coordinated to be done for next week.  In the interim I'm recommending he take his Linzess only once daily, and supplement this with MiraLAX twice a day and can titrate as needed to produce bowel movements. I recommended a rectal exam in clinic today which he declined, preferring to wait until his colonoscopy next week.  He agreed with the plan, all questions answered.  [place] Cellar, MD [facility]  CC: Lucia Gaskins, MD

## 2018-08-27 NOTE — Patient Instructions (Addendum)
If you are age 73 or older, your body mass index should be between 23-30. Your Body mass index is 24.94 kg/m. If this is out of the aforementioned range listed, please consider follow up with your Primary Care Provider.  If you are age 42 or younger, your body mass index should be between 19-25. Your Body mass index is 24.94 kg/m. If this is out of the aformentioned range listed, please consider follow up with your Primary Care Provider.   You have been scheduled for a colonoscopy. Please follow written instructions given to you at your visit today.  Please pick up your prep supplies at the pharmacy within the next 1-3 days. If you use inhalers (even only as needed), please bring them with you on the day of your procedure. Your physician has requested that you go to www.startemmi.com and enter the access code given to you at your visit today. This web site gives a general overview about your procedure. However, you should still follow specific instructions given to you by our office regarding your preparation for the procedure.  Please purchase the following medications over the counter and take as directed: Miralax: Take twice a day and adjust as needed  Continue taking Linzess 290 mcg: Take ONCE a day  Thank you for entrusting me with your care and for choosing [facility], Dr. Worthington Cellar

## 2018-08-31 ENCOUNTER — Other Ambulatory Visit: Payer: Self-pay

## 2018-09-03 ENCOUNTER — Ambulatory Visit: Payer: Medicare Other | Admitting: Gastroenterology

## 2018-09-04 ENCOUNTER — Other Ambulatory Visit: Payer: Self-pay

## 2018-09-04 ENCOUNTER — Ambulatory Visit (HOSPITAL_COMMUNITY): Payer: Medicare Other | Admitting: Anesthesiology

## 2018-09-04 ENCOUNTER — Ambulatory Visit (HOSPITAL_COMMUNITY)
Admission: RE | Admit: 2018-09-04 | Discharge: 2018-09-04 | Disposition: A | Discharge status: Home / Self Care | Payer: Medicare Other | Source: Ambulatory Visit | Attending: Gastroenterology | Admitting: Gastroenterology

## 2018-09-04 ENCOUNTER — Encounter (HOSPITAL_COMMUNITY): Payer: Self-pay | Admitting: Emergency Medicine

## 2018-09-04 ENCOUNTER — Encounter (HOSPITAL_COMMUNITY)
Admission: RE | Disposition: A | Discharge status: Home / Self Care | Payer: Self-pay | Source: Ambulatory Visit | Attending: Gastroenterology

## 2018-09-04 DIAGNOSIS — K4090 Unilateral inguinal hernia, without obstruction or gangrene, not specified as recurrent: Secondary | ICD-10-CM | POA: Insufficient documentation

## 2018-09-04 DIAGNOSIS — M549 Dorsalgia, unspecified: Secondary | ICD-10-CM | POA: Insufficient documentation

## 2018-09-04 DIAGNOSIS — K5900 Constipation, unspecified: Secondary | ICD-10-CM | POA: Diagnosis present

## 2018-09-04 DIAGNOSIS — K648 Other hemorrhoids: Secondary | ICD-10-CM | POA: Diagnosis not present

## 2018-09-04 DIAGNOSIS — D120 Benign neoplasm of cecum: Secondary | ICD-10-CM

## 2018-09-04 DIAGNOSIS — Z79899 Other long term (current) drug therapy: Secondary | ICD-10-CM | POA: Diagnosis not present

## 2018-09-04 DIAGNOSIS — D123 Benign neoplasm of transverse colon: Secondary | ICD-10-CM | POA: Diagnosis not present

## 2018-09-04 DIAGNOSIS — K621 Rectal polyp: Secondary | ICD-10-CM

## 2018-09-04 DIAGNOSIS — D122 Benign neoplasm of ascending colon: Secondary | ICD-10-CM | POA: Diagnosis not present

## 2018-09-04 DIAGNOSIS — J449 Chronic obstructive pulmonary disease, unspecified: Secondary | ICD-10-CM | POA: Diagnosis not present

## 2018-09-04 DIAGNOSIS — M810 Age-related osteoporosis without current pathological fracture: Secondary | ICD-10-CM | POA: Diagnosis not present

## 2018-09-04 DIAGNOSIS — F17210 Nicotine dependence, cigarettes, uncomplicated: Secondary | ICD-10-CM | POA: Diagnosis not present

## 2018-09-04 DIAGNOSIS — D124 Benign neoplasm of descending colon: Secondary | ICD-10-CM | POA: Diagnosis not present

## 2018-09-04 DIAGNOSIS — Z87442 Personal history of urinary calculi: Secondary | ICD-10-CM | POA: Diagnosis not present

## 2018-09-04 DIAGNOSIS — R194 Change in bowel habit: Secondary | ICD-10-CM

## 2018-09-04 DIAGNOSIS — Z79891 Long term (current) use of opiate analgesic: Secondary | ICD-10-CM | POA: Insufficient documentation

## 2018-09-04 DIAGNOSIS — Z800 Family history of malignant neoplasm of digestive organs: Secondary | ICD-10-CM | POA: Insufficient documentation

## 2018-09-04 DIAGNOSIS — Z9981 Dependence on supplemental oxygen: Secondary | ICD-10-CM

## 2018-09-04 DIAGNOSIS — D127 Benign neoplasm of rectosigmoid junction: Secondary | ICD-10-CM | POA: Insufficient documentation

## 2018-09-04 DIAGNOSIS — D126 Benign neoplasm of colon, unspecified: Secondary | ICD-10-CM

## 2018-09-04 DIAGNOSIS — R140 Abdominal distension (gaseous): Secondary | ICD-10-CM

## 2018-09-04 DIAGNOSIS — G8929 Other chronic pain: Secondary | ICD-10-CM | POA: Diagnosis not present

## 2018-09-04 HISTORY — PX: POLYPECTOMY: SHX5525

## 2018-09-04 HISTORY — PX: COLONOSCOPY WITH PROPOFOL: SHX5780

## 2018-09-04 SURGERY — COLONOSCOPY WITH PROPOFOL
Anesthesia: Monitor Anesthesia Care

## 2018-09-04 MED ORDER — SODIUM CHLORIDE 0.9 % IV SOLN: INTRAVENOUS | Status: DC

## 2018-09-04 MED ORDER — PROPOFOL 500 MG/50ML IV EMUL
INTRAVENOUS | Status: DC | PRN
  Administered 2018-09-04: 150 ug/kg/min via INTRAVENOUS

## 2018-09-04 MED ORDER — PROPOFOL 500 MG/50ML IV EMUL
INTRAVENOUS | Status: DC | PRN
  Administered 2018-09-04: 60 mg via INTRAVENOUS

## 2018-09-04 MED ORDER — ALBUTEROL SULFATE (2.5 MG/3ML) 0.083% IN NEBU
INHALATION_SOLUTION | RESPIRATORY_TRACT | Status: AC
  Filled 2018-09-04: qty 3

## 2018-09-04 MED ORDER — ALBUTEROL SULFATE (2.5 MG/3ML) 0.083% IN NEBU
2.5000 mg | INHALATION_SOLUTION | Freq: Once | RESPIRATORY_TRACT | Status: AC
  Administered 2018-09-04: 2.5 mg via RESPIRATORY_TRACT

## 2018-09-04 MED ORDER — LACTATED RINGERS IV SOLN
INTRAVENOUS | Status: DC
  Administered 2018-09-04 (×2): via INTRAVENOUS

## 2018-09-04 MED ORDER — PROPOFOL 10 MG/ML IV BOLUS
INTRAVENOUS | Status: AC
  Filled 2018-09-04: qty 40

## 2018-09-04 MED ORDER — PHENYLEPHRINE HCL 10 MG/ML IJ SOLN
INTRAMUSCULAR | Status: DC | PRN
  Administered 2018-09-04: 120 ug via INTRAVENOUS
  Administered 2018-09-04: 80 ug via INTRAVENOUS
  Administered 2018-09-04: 120 ug via INTRAVENOUS

## 2018-09-04 SURGICAL SUPPLY — 22 items

## 2018-09-04 NOTE — Anesthesia Preprocedure Evaluation (Signed)
Anesthesia Evaluation  Patient identified by MRN, date of birth, ID band Patient awake    Reviewed: Allergy & Precautions, NPO status , Patient's Chart, lab work & pertinent test results  Airway Mallampati: II  TM Distance: >3 FB Neck ROM: Full    Dental no notable dental hx.    Pulmonary COPD, Current Smoker,    Pulmonary exam normal breath sounds clear to auscultation       Cardiovascular negative cardio ROS Normal cardiovascular exam Rhythm:Regular Rate:Normal     Neuro/Psych negative neurological ROS  negative psych ROS   GI/Hepatic negative GI ROS, Neg liver ROS,   Endo/Other  negative endocrine ROS  Renal/GU negative Renal ROS  negative genitourinary   Musculoskeletal negative musculoskeletal ROS (+)   Abdominal   Peds negative pediatric ROS (+)  Hematology negative hematology ROS (+)   Anesthesia Other Findings   Reproductive/Obstetrics negative OB ROS                             Anesthesia Physical Anesthesia Plan  ASA: III  Anesthesia Plan: MAC   Post-op Pain Management:    Induction: Intravenous  PONV Risk Score and Plan:   Airway Management Planned: Simple Face Mask  Additional Equipment:   Intra-op Plan:   Post-operative Plan:   Informed Consent: I have reviewed the patients History and Physical, chart, labs and discussed the procedure including the risks, benefits and alternatives for the proposed anesthesia with the patient or authorized representative who has indicated his/her understanding and acceptance.   Dental advisory given  Plan Discussed with: CRNA  Anesthesia Plan Comments:        Anesthesia Quick Evaluation

## 2018-09-04 NOTE — Interval H&P Note (Signed)
History and Physical Interval Note:  09/04/2018 11:48 AM  Corey Griffin  has presented today for surgery, with the diagnosis of change in bowel habits/abdominal distention/O2 dependent/jmh  The various methods of treatment have been discussed with the patient and family. After consideration of risks, benefits and other options for treatment, the patient has consented to  Procedure(s): COLONOSCOPY WITH PROPOFOL (N/A) as a surgical intervention .  The patient's history has been reviewed, patient examined, no change in status, stable for surgery.  I have reviewed the patient's chart and labs.  Questions were answered to the patient's satisfaction.     [place]

## 2018-09-04 NOTE — Anesthesia Postprocedure Evaluation (Signed)
Anesthesia Post Note  Patient: Corey Griffin  Procedure(s) Performed: COLONOSCOPY WITH PROPOFOL (N/A ) POLYPECTOMY     Patient location during evaluation: Endoscopy Anesthesia Type: MAC Level of consciousness: awake and alert Pain management: pain level controlled Vital Signs Assessment: post-procedure vital signs reviewed and stable Respiratory status: spontaneous breathing, nonlabored ventilation, respiratory function stable and patient connected to nasal cannula oxygen Cardiovascular status: stable and blood pressure returned to baseline Postop Assessment: no apparent nausea or vomiting Anesthetic complications: no    Last Vitals:  Vitals:   09/04/18 1310 09/04/18 1320  BP: 113/65 139/71  Pulse: 96 95  Resp: (!) 22 (!) 25  Temp:    SpO2: 99% 99%    Last Pain:  Vitals:   09/04/18 1310  TempSrc:   PainSc: 0-No pain                 Montez Hageman

## 2018-09-04 NOTE — Discharge Instructions (Signed)

## 2018-09-04 NOTE — Transfer of Care (Signed)
Immediate Anesthesia Transfer of Care Note  Patient: Corey Griffin  Procedure(s) Performed: COLONOSCOPY WITH PROPOFOL (N/A ) POLYPECTOMY  Patient Location: PACU  Anesthesia Type:MAC  Level of Consciousness: sedated, patient cooperative and responds to stimulation  Airway & Oxygen Therapy: Patient Spontanous Breathing and Patient connected to face mask oxygen  Post-op Assessment: Report given to RN and Post -op Vital signs reviewed and stable  Post vital signs: Reviewed and stable  Last Vitals:  Vitals Value Taken Time  BP 106/51 09/04/2018 12:58 PM  Temp    Pulse 89 09/04/2018 12:59 PM  Resp 16 09/04/2018 12:59 PM  SpO2 100 % 09/04/2018 12:59 PM  Vitals shown include unvalidated device data.  Last Pain:  Vitals:   09/04/18 1258  TempSrc:   PainSc: 0-No pain         Complications: No apparent anesthesia complications

## 2018-09-04 NOTE — Op Note (Signed)
[facility] Patient Name: Corey Griffin Procedure Date: 09/04/2018 MRN: [id number] Attending MD: Carlota Raspberry. Havery Moros , MD Date of Birth: [birth year] CSN: [id number] Age: 72 Admit Type: Inpatient Procedure:                Colonoscopy Indications:              This is the patient's first colonoscopy, Change in                            bowel habits (constipation) Providers:                Carlota Raspberry. Havery Moros, MD, Elmer Ramp. Tilden Dome, RN,                            William Dalton, Technician, Herbie Drape, CRNA Referring MD:              Medicines:                Monitored Anesthesia Care Complications:            No immediate complications. Estimated blood loss:                            Minimal. Estimated Blood Loss:     Estimated blood loss was minimal. Procedure:                Pre-Anesthesia Assessment:                           - Prior to the procedure, a History and Physical                            was performed, and patient medications and                            allergies were reviewed. The patient's tolerance of                            previous anesthesia was also reviewed. The risks                            and benefits of the procedure and the sedation                            options and risks were discussed with the patient.                            All questions were answered, and informed consent                            was obtained. Prior Anticoagulants: The patient has                            taken no previous anticoagulant or antiplatelet  agents. ASA Grade Assessment: III - A patient with                            severe systemic disease. After reviewing the risks                            and benefits, the patient was deemed in                            satisfactory condition to undergo the procedure.                           After obtaining informed consent, the colonoscope   was passed under direct vision. Throughout the                            procedure, the patient's blood pressure, pulse, and                            oxygen saturations were monitored continuously. The                            [id number]-H190DL ([id number] Olympus peds colonoscope was                            introduced through the anus and advanced to the the                            cecum, identified by appendiceal orifice and                            ileocecal valve. The colonoscopy was technically                            difficult and complex due to unsatisfactory bowel                            prep. The patient tolerated the procedure well. The                            quality of the bowel preparation was                            unsatisfactory. The ileocecal valve, appendiceal                            orifice, and rectum were photographed. Scope In: 12:01:22 PM Scope Out: 12:50:45 PM Scope Withdrawal Time: 0 hours 40 minutes 3 seconds  Total Procedure Duration: 0 hours 49 minutes 23 seconds  Findings:      Hemorrhoids were found on perianal exam.      A moderate amount of semi-liquid stool was found in the entire colon,       making visualization difficult, worst affected area was cecum and right       colon. Lavage  of the area was performed using copious amounts of sterile       water, resulting in incomplete clearance with fair visualization. Small       or flat polyps may not have been appreciated in light of the bowel prep       in some areas.      A 7 to 8 mm polyp was found in the cecum. The polyp was sessile. The       polyp was removed with a cold snare. Resection and retrieval were       complete.      A 7 mm polyp was found in the ileocecal valve. The polyp was sessile.       The polyp was removed with a cold snare. Resection and retrieval were       complete.      A 7 mm polyp was found in the ascending colon. The polyp was sessile.       The polyp was  removed with a cold snare. Resection and retrieval were       complete.      Two sessile polyps were found in the transverse colon. The polyps were 3       to 4 mm in size. These polyps were removed with a cold snare. Resection       and retrieval were complete.      Four sessile polyps were found in the descending colon. The polyps were       3 to 5 mm in size. These polyps were removed with a cold snare.       Resection and retrieval were complete.      A 6 mm polyp was found in the recto-sigmoid colon. The polyp was       sessile. The polyp was removed with a cold snare. Resection and       retrieval were complete.      A 7 mm polyp was found in the recto-sigmoid colon. The polyp was       semi-pedunculated. The polyp was removed with a hot snare. Resection and       retrieval were complete.      A 8 mm polyp was found in the rectum. The polyp was pedunculated. The       polyp was removed with a hot snare. Resection and retrieval were       complete.      Internal hemorrhoids were found during retroflexion.      The exam was otherwise without abnormality - no obvious mass lesions or       luminal stenosis. Impression:               - Preparation of the colon was unsatisfactory -                            small or flat polyps may not have been appreciated.                           - Hemorrhoids found on perianal exam.                           - One 7 to 8 mm polyp in the cecum, removed with a  cold snare. Resected and retrieved.                           - One 7 mm polyp at the ileocecal valve, removed                            with a cold snare. Resected and retrieved.                           - One 7 mm polyp in the ascending colon, removed                            with a cold snare. Resected and retrieved.                           - Two 3 to 4 mm polyps in the transverse colon,                            removed with a cold snare. Resected and  retrieved.                           - Four 3 to 5 mm polyps in the descending colon,                            removed with a cold snare. Resected and retrieved.                           - One 6 mm polyp at the recto-sigmoid colon,                            removed with a cold snare. Resected and retrieved.                           - One 7 mm polyp at the recto-sigmoid colon,                            removed with a hot snare. Resected and retrieved.                           - One 8 mm polyp in the rectum, removed with a hot                            snare. Resected and retrieved.                           - Internal hemorrhoids.                           - The examination was otherwise normal. Moderate Sedation:      No moderate sedation, case performed with MAC Recommendation:           - Patient has a contact number available for  emergencies. The signs and symptoms of potential                            delayed complications were discussed with the                            patient. Return to normal activities tomorrow.                            Written discharge instructions were provided to the                            patient.                           - Resume previous diet.                           - Continue present medications.                           - Await pathology results.                           - Repeat colonoscopy at some point in time because                            the bowel preparation was suboptimal and numerous                            polyps removed today                           - No ibuprofen, naproxen, or other non-steroidal                            anti-inflammatory drugs for 2 weeks after polyp                            removal.                           - Will discuss other options for constipation                            regimen with the patient (Linzess and Miralax have                             not worked too well) Procedure Code(s):        --- Professional ---                           [phone], Colonoscopy, flexible; with removal of                            tumor(s), polyp(s), or other lesion(s) by snare  technique Diagnosis Code(s):        --- Professional ---                           K64.8, Other hemorrhoids                           D12.0, Benign neoplasm of cecum                           D12.2, Benign neoplasm of ascending colon                           D12.7, Benign neoplasm of rectosigmoid junction                           K62.1, Rectal polyp                           D12.3, Benign neoplasm of transverse colon (hepatic                            flexure or splenic flexure)                           D12.4, Benign neoplasm of descending colon                           R19.4, Change in bowel habit CPT copyright 2018 [employer]. All rights reserved. The codes documented in this report are preliminary and upon coder review may  be revised to meet current compliance requirements. Remo Lipps P. Armbruster, MD 09/04/2018 1:02:33 PM This report has been signed electronically. Number of Addenda: 0

## 2018-09-07 ENCOUNTER — Telehealth: Payer: Self-pay | Admitting: Gastroenterology

## 2018-09-07 NOTE — Telephone Encounter (Signed)
Pt inquiring about path results.

## 2018-09-07 NOTE — Telephone Encounter (Signed)
Let patient know that Dr. Havery Moros has not had a chance to review the results yet, but when he does will contact him.

## 2019-01-07 ENCOUNTER — Encounter: Payer: Self-pay | Admitting: Gastroenterology

## 2020-10-16 ENCOUNTER — Other Ambulatory Visit (HOSPITAL_COMMUNITY): Payer: Self-pay | Admitting: Family Medicine

## 2020-10-16 DIAGNOSIS — M545 Low back pain, unspecified: Secondary | ICD-10-CM

## 2020-10-20 ENCOUNTER — Other Ambulatory Visit: Payer: Self-pay

## 2020-10-20 ENCOUNTER — Ambulatory Visit (HOSPITAL_COMMUNITY)
Admission: RE | Admit: 2020-10-20 | Discharge: 2020-10-20 | Disposition: A | Discharge status: Home / Self Care | Payer: Medicare Other | Source: Ambulatory Visit | Attending: Family Medicine | Admitting: Family Medicine

## 2020-10-20 DIAGNOSIS — M545 Low back pain, unspecified: Secondary | ICD-10-CM

## 2020-10-28 ENCOUNTER — Other Ambulatory Visit: Payer: Self-pay | Admitting: Family Medicine

## 2020-10-28 DIAGNOSIS — M545 Low back pain, unspecified: Secondary | ICD-10-CM

## 2020-11-09 ENCOUNTER — Encounter (HOSPITAL_COMMUNITY): Payer: Self-pay

## 2020-11-09 ENCOUNTER — Ambulatory Visit (HOSPITAL_COMMUNITY): Payer: Medicare Other

## 2020-11-11 ENCOUNTER — Ambulatory Visit (HOSPITAL_COMMUNITY)
Admission: RE | Admit: 2020-11-11 | Discharge: 2020-11-11 | Disposition: A | Discharge status: Home / Self Care | Payer: Medicare Other | Source: Ambulatory Visit | Attending: Family Medicine | Admitting: Family Medicine

## 2020-11-11 ENCOUNTER — Ambulatory Visit: Payer: Medicare Other

## 2020-11-11 ENCOUNTER — Other Ambulatory Visit: Payer: Self-pay

## 2020-11-11 DIAGNOSIS — M545 Low back pain, unspecified: Secondary | ICD-10-CM | POA: Diagnosis present

## 2020-12-21 IMAGING — MR MR LUMBAR SPINE W/O CM
5 series · 31 of 48 positions shown · non-contrast
Comparison: Radiography 10/20/2020.  MRI 04/04/2017.

CLINICAL DATA: Back pain over the last 3 months

EXAM:
MRI LUMBAR SPINE WITHOUT CONTRAST
TECHNIQUE: Multiplanar, multisequence MR imaging of the lumbar spine was
performed. No intravenous contrast was administered.

[Series 5: T2 · sagittal · 4.0mm · 0.68mm/px · 6 of 17 slices shown (1 of 2)]
[im 1/17]
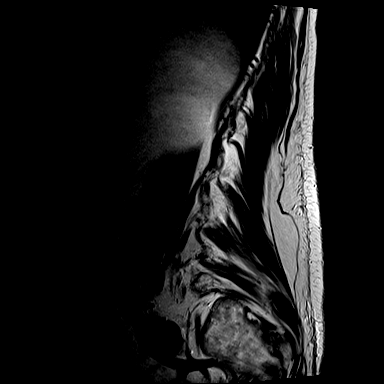
[im 4/17]
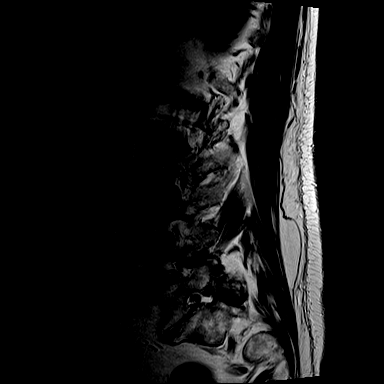
[im 7/17]
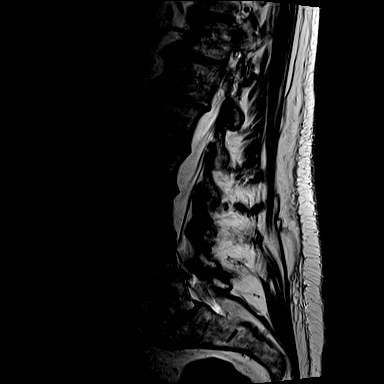
[im 10/17]
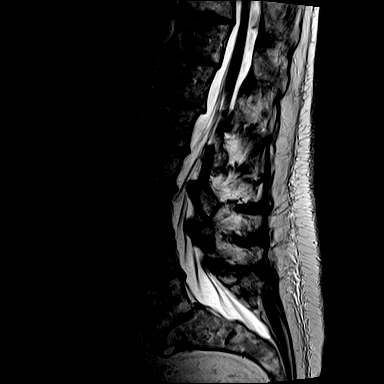
[im 13/17]
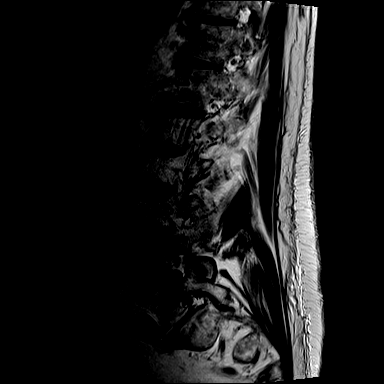
[im 17/17]
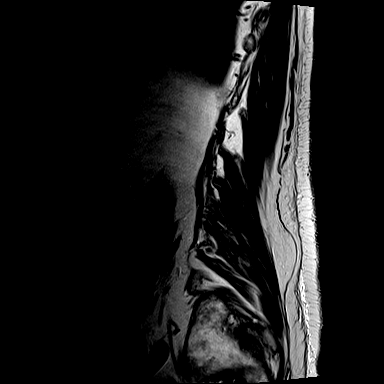

[Series 6: T1 · sagittal · 4.0mm · 0.81mm/px · 7 of 17 slices shown (1 of 2)]
[im 1/17]
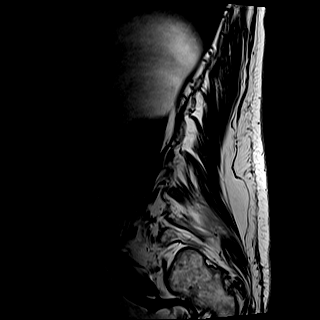
[im 3/17]
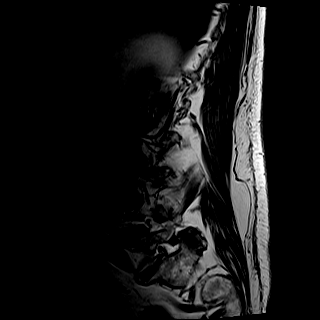
[im 6/17]
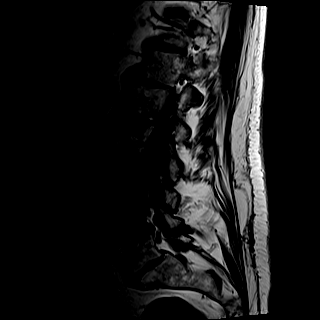
[im 9/17]
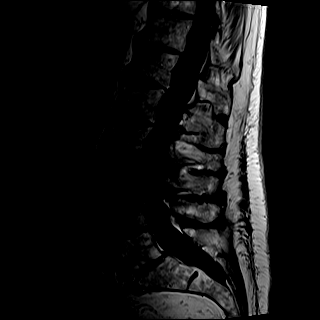
[im 11/17]
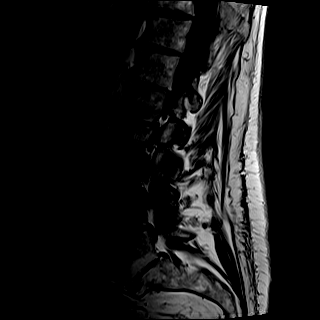
[im 14/17]
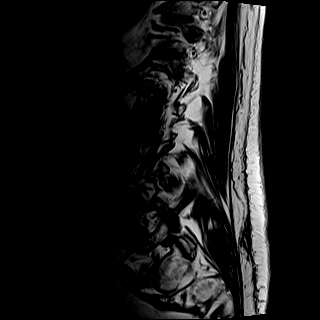
[im 17/17]
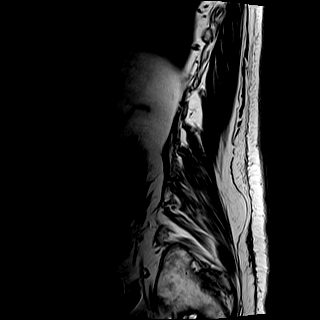

[Series 7: STIR · sagittal · 4.0mm · 0.51mm/px · 2 of 17 slices shown]
[im 1/17]
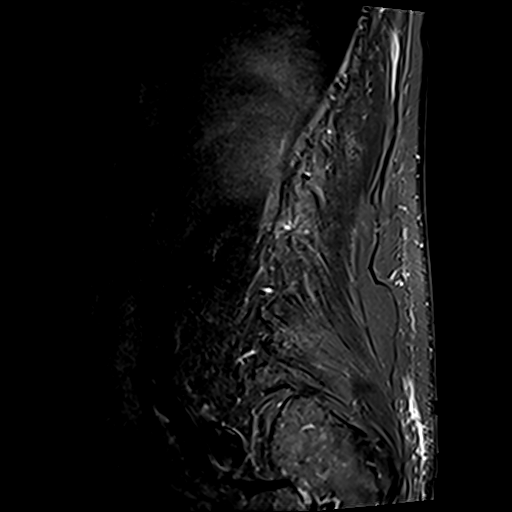
[im 3/17]
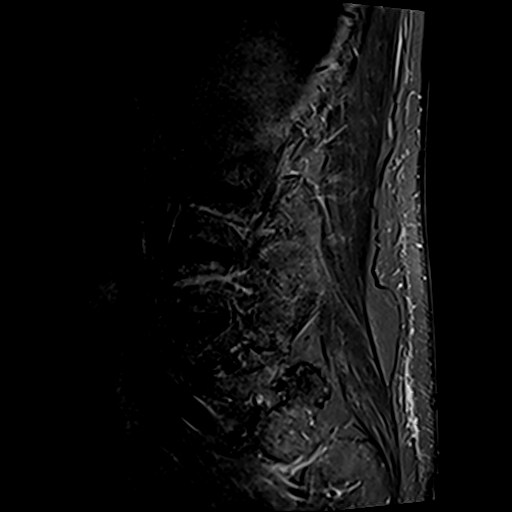

[Series 8: T2 · axial · 4.0mm · 0.70mm/px · z∈[-157,+105]mm · 8 of 37 slices shown (2 of 2)]
[im 1/37]
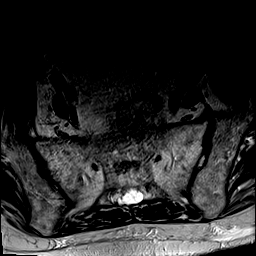
[im 6/37]
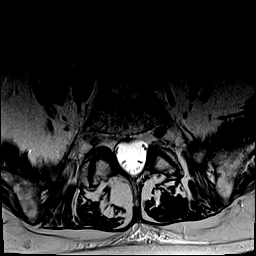
[im 12/37]
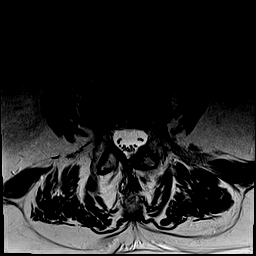
[im 17/37]
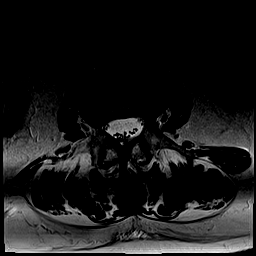
[im 20/37]
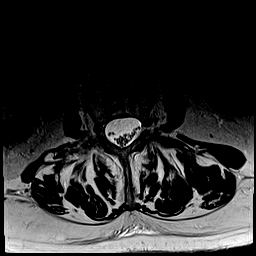
[im 25/37]
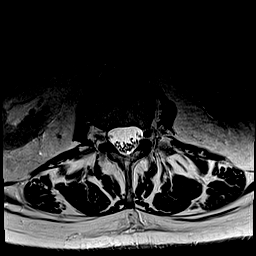
[im 31/37]
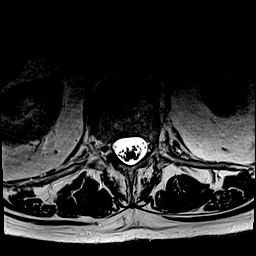
[im 37/37]
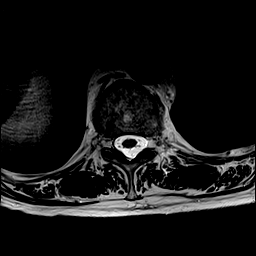

[Series 9: T1 · axial · 4.0mm · 0.35mm/px · z∈[-157,+105]mm · 8 of 37 slices shown (2 of 2)]
[im 1/37]
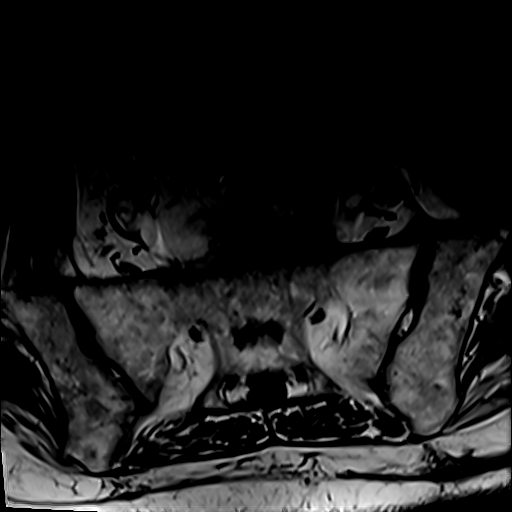
[im 6/37]
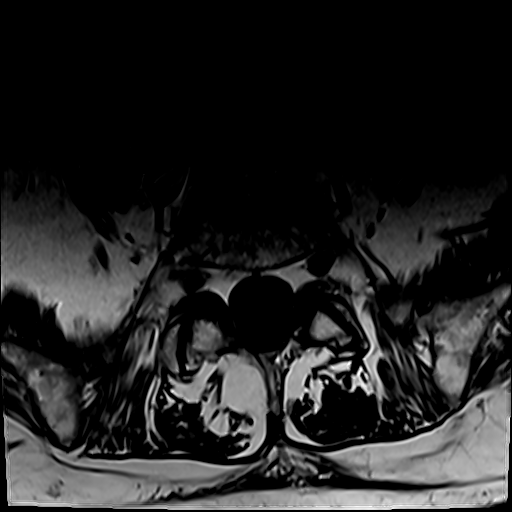
[im 12/37]
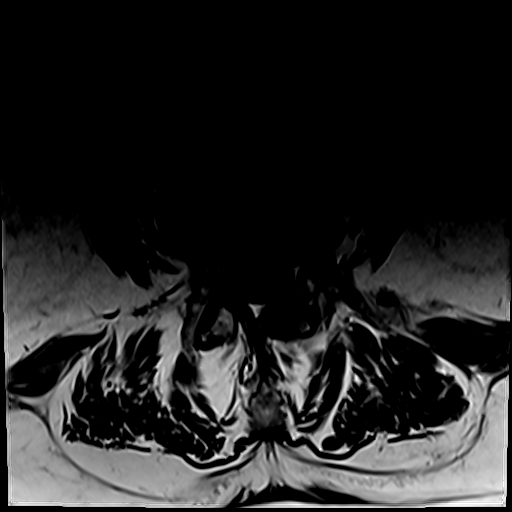
[im 17/37]
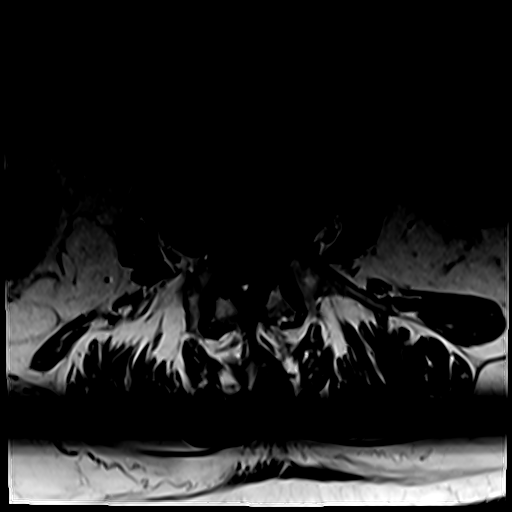
[im 20/37]
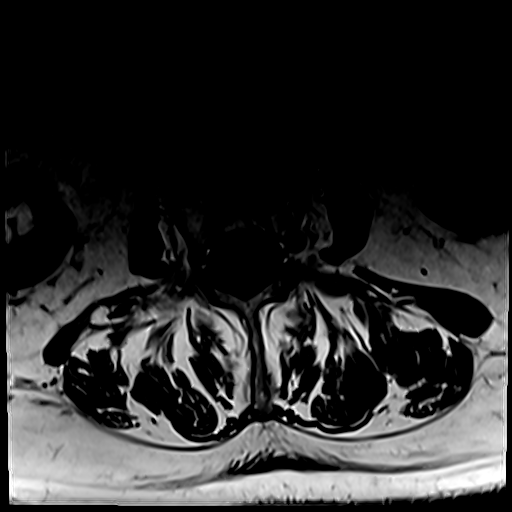
[im 25/37]
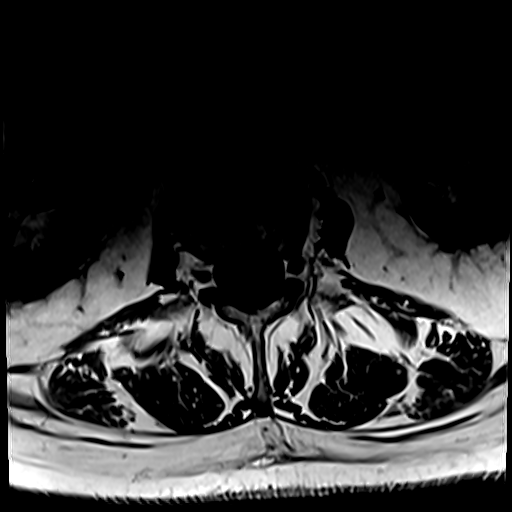
[im 31/37]
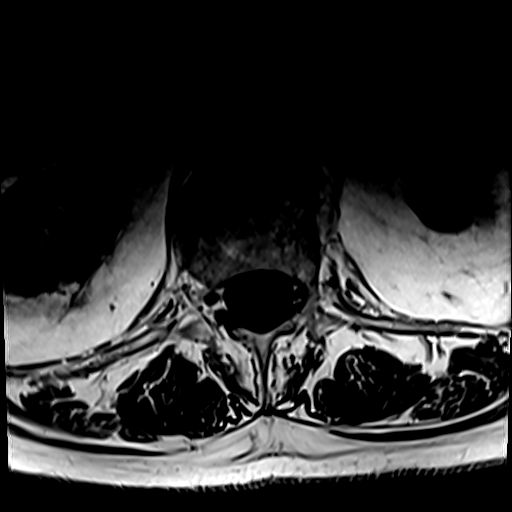
[im 37/37]
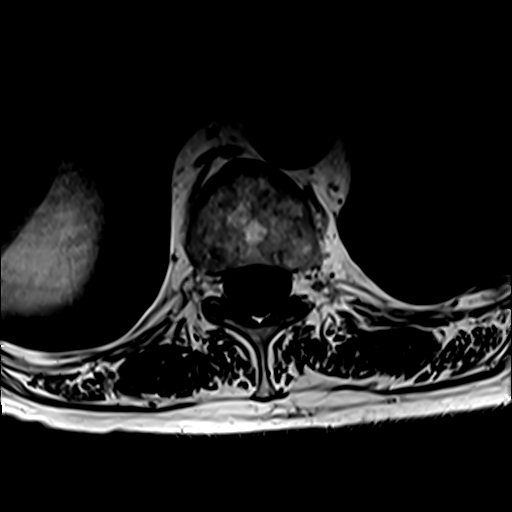

[31 of 48 positions shown; findings below may reference images not displayed]

FINDINGS: Segmentation: 5 lumbar type vertebral bodies as numbered previously.

Alignment:  Normal

Vertebrae: Old healed superior endplate fracture at L2 with loss of
height of 25%. No retropulsed bone. Recent minor superior endplate
fracture at L3 to the right of midline with minimal loss of height
and no retropulsion. Mild edema associated with this. Recent
superior endplate fracture at L4 with loss of height of 25%. No
retropulsed bone. Mild edema associated with this. Slight
progression since the radiograph of 10/20/2020.

Conus medullaris and cauda equina: Conus extends to the L1 level.
Conus and cauda equina appear normal.

Paraspinal and other soft tissues: Negative

Disc levels:

Mild, non-compressive disc bulges throughout the lumbar region. No
disc herniation. No compressive stenosis of the canal or foramina.
Ordinary mild lower lumbar facet osteoarthritis without edema.
IMPRESSION: 1. Recent superior endplate fractures at L3 and L4 with loss of
height of 25% at L4 and minimal loss height the right-side of center
at L3. No retropulsed bone. These look like benign osteoporotic
fractures.
2. Old healed superior endplate fracture at L2.
3. Mild, non-compressive degenerative changes.

## 2021-04-02 ENCOUNTER — Other Ambulatory Visit: Payer: Self-pay

## 2021-04-02 ENCOUNTER — Other Ambulatory Visit (HOSPITAL_COMMUNITY): Payer: Self-pay | Admitting: Family Medicine

## 2021-04-02 ENCOUNTER — Ambulatory Visit (HOSPITAL_COMMUNITY)
Admission: RE | Admit: 2021-04-02 | Discharge: 2021-04-02 | Disposition: A | Discharge status: Home / Self Care | Payer: Medicare Other | Source: Ambulatory Visit | Attending: Family Medicine | Admitting: Family Medicine

## 2021-04-02 DIAGNOSIS — I509 Heart failure, unspecified: Secondary | ICD-10-CM

## 2022-02-07 ENCOUNTER — Other Ambulatory Visit: Payer: Self-pay

## 2022-02-07 ENCOUNTER — Ambulatory Visit (INDEPENDENT_AMBULATORY_CARE_PROVIDER_SITE_OTHER): Payer: Medicare Other | Admitting: Internal Medicine

## 2022-02-07 ENCOUNTER — Encounter: Payer: Self-pay | Admitting: Internal Medicine

## 2022-02-07 DIAGNOSIS — J9611 Chronic respiratory failure with hypoxia: Secondary | ICD-10-CM

## 2022-02-07 DIAGNOSIS — J449 Chronic obstructive pulmonary disease, unspecified: Secondary | ICD-10-CM | POA: Diagnosis not present

## 2022-02-07 NOTE — Patient Instructions (Addendum)
Plan A = Automatic = Always=    Symbicort  160 Take 2 puffs first thing in am and then another 2 puffs about 12 hours later.  ?  ?Work on inhaler technique:  relax and gently blow all the way out then take a nice smooth full deep breath back in, triggering the inhaler at same time you start breathing in.  Hold for up to 5 seconds if you can. Blow out thru nose. Rinse and gargle with water when done.  If mouth or throat bother you at all,  try brushing teeth/gums/tongue with arm and hammer toothpaste/ make a slurry and gargle and spit out.  ? ?If you substitue Breztri for symbicort then Plan B is just albuterol, not with ipatropium anoro or spiriva as these will interact adversely  ?   ? ?Plan B = Backup (to supplement plan A, not to replace it) ?Only use your albuterol-ipatropium nebulizer as a rescue medication to be used if you can't catch your breath by resting or doing a relaxed purse lip breathing pattern.  ?- The less you use it, the better it will work when you need it. ?- Ok to use the inhaler up to  every 4 hours if you must but call for appointment if use goes up over your usual need ?- Don't leave home without it !!  (think of it like the spare tire for your car)  ?  ? ? Make sure you check your oxygen saturation  AT  your highest level of activity (not after you stop)   to be sure it stays over 90% and adjust  02 flow upward to maintain this level if needed but remember to turn it back to previous settings when you stop (to conserve your supply).  ? ?Please remember to go to the lab department   for your tests - we will call you with the results when they are available. ?    ? ?Please remember to go to the  x-ray department  @  [facility] for your tests - we will call you with the results when they are available    ? ? ?Please schedule a follow up visit in 3 months but call sooner if needed  - bring all active meds  ?

## 2022-02-07 NOTE — Assessment & Plan Note (Signed)
Placed himself on his wife's 02 at her death ? 02/16/2017   As of 14-Feb-2022 ok to use 2lpm and titrate daytime to sats > 90%

## 2022-02-07 NOTE — Progress Notes (Unsigned)
[place]y [place], male    DOB: 29-Aug-1946,    MRN: [id number]   Brief patient profile:  10  yowm  quit smoking ? 2016 referred to pulmonary clinic in [place]  02/07/2022 by Dr Delphina Cahill  for copd/02 dep dx around 2012       History of Present Illness  02/07/2022  Pulmonary/ 1st office eval/ [place] [facility] [place] [facility]  Chief Complaint  Patient presents with   Consult    Consult for COPD and O2 dependence   Dyspnea:  40 ft and catch breath on 02 2.5lpm sats 88% though adjusts up to 4lpm does not really titrate Cough: very hoarse /no sputum  Sleep: in recliner x 45 degrees x 2021  around 2lpm 24/7 not checking sats  SABA use: duoneb bid  very disorganized with maint vs prn / says no better on pred  No obvious day to day or daytime variability or assoc excess/ purulent sputum or mucus plugs or hemoptysis or cp or chest tightness, subjective wheeze or overt sinus or hb symptoms.    . Also denies any obvious fluctuation of symptoms with weather or environmental changes or other aggravating or alleviating factors except as outlined above   No unusual exposure hx or h/o childhood pna/ asthma or knowledge of premature birth.  Current Allergies, Complete Past Medical History, Past Surgical History, Family History, and Social History were reviewed in [employer] record.  ROS  The following are not active complaints unless bolded Hoarseness, sore throat, dysphagia, dental problems, itching, sneezing,  nasal congestion or discharge of excess mucus or purulent secretions, ear ache,   fever, chills, sweats, unintended wt loss or wt gain, classically pleuritic or exertional cp,  orthopnea pnd or arm/hand swelling  or leg swelling, presyncope, palpitations, abdominal pain, anorexia, nausea, vomiting, diarrhea  or change in bowel habits or change in bladder habits, change in stools or change in urine, dysuria, hematuria,  rash, arthralgias, visual complaints, headache, numbness,  weakness or ataxia or problems with walking or coordination,  change in mood or  memory.           Past Medical History:  Diagnosis Date   COPD (chronic obstructive pulmonary disease) ([place])     Outpatient Medications Prior to Visit - - NOTE:   Unable to verify as accurately reflecting what pt takes  e  Medication Sig Dispense Refill   albuterol (PROVENTIL HFA;VENTOLIN HFA) 108 (90 Base) MCG/ACT inhaler Inhale 1-2 puffs into the lungs every 6 (six) hours as needed for wheezing or shortness of breath. 1 Inhaler 0   albuterol (PROVENTIL) (2.5 MG/3ML) 0.083% nebulizer solution Take 3 mLs (2.5 mg total) by nebulization every 4 (four) hours as needed for wheezing or shortness of breath. 75 mL 1          budesonide-formoterol (SYMBICORT) 160-4.5 MCG/ACT inhaler Inhale 2 puffs into the lungs 2 (two) times daily.     diazepam (VALIUM) 5 MG tablet Take 5 mg by mouth daily as needed for anxiety.     ipratropium (ATROVENT HFA) 17 MCG/ACT inhaler Inhale 2 puffs into the lungs every 6 (six) hours. 1 Inhaler 2   linaclotide (LINZESS) 290 MCG CAPS capsule Take 1 capsule (290 mcg total) by mouth daily before breakfast. 90 capsule 1   magnesium hydroxide (MILK OF MAGNESIA) 400 MG/5ML suspension Take 10 mLs by mouth daily as needed for mild constipation.     mometasone (NASONEX) 50 MCG/ACT nasal spray Place 1 spray into the nose  2 (two) times daily.      naproxen (NAPROSYN) 500 MG tablet Take 500 mg by mouth 2 (two) times daily as needed for moderate pain.      oxycodone (OXY-IR) 5 MG capsule Take 5 mg by mouth every 6 (six) hours as needed for pain.      oxymetazoline (AFRIN) 0.05 % nasal spray Place 1 spray into both nostrils daily as needed for congestion.     polyethylene glycol (MIRALAX / GLYCOLAX) packet Take 17 g by mouth 2 (two) times daily.     predniSONE (DELTASONE) 10 MG tablet Take 10 mg by mouth every other day.     SUPREP BOWEL PREP KIT 17.5-3.13-1.6 GM/177ML SOLN Suprep-Use as directed 354 mL 0    tamsulosin (FLOMAX) 0.4 MG CAPS capsule Take 0.4 mg by mouth daily.     tiotropium (SPIRIVA) 18 MCG inhalation capsule Place 18 mcg into inhaler and inhale at bedtime. Late at night     traZODone (DESYREL) 50 MG tablet Take 50 mg by mouth at bedtime.     umeclidinium-vilanterol (ANORO ELLIPTA) 62.5-25 MCG/INH AEPB Inhale 1 puff into the lungs daily.     No facility-administered medications prior to visit.     Objective:     BP (!) 144/84 (BP Location: Left Arm, Patient Position: Sitting)    Pulse (!) 112    Temp 98 F (36.7 C) (Temporal)    SpO2 94% Comment: 4LO2 pulse  SpO2: 94 % (4LO2 pulse)  Very debilitated looking disheveled ederly w/c bound hoarse wm nad at rest  HEENT : pt wearing mask not removed for exam due to covid -19 concerns.    NECK :  without JVD/Nodes/TM/ nl carotid upstrokes bilaterally   LUNGS: no acc muscle use,  Mod barrel  contour chest wall with bilateral  Distant bs s audible wheeze and  without cough on insp or exp maneuvers and mod  Hyperresonant  to  percussion bilaterally     CV:  RRR  no s3 or murmur or increase in P2, and no edema   ABD:  soft and nontender with pos mid insp Hoover's  in the supine position. No bruits or organomegaly appreciated, bowel sounds nl  MS:     ext warm without deformities, calf tenderness, cyanosis or clubbing No obvious joint restrictions   SKIN: warm and dry without lesions    NEURO:  alert, approp, nl sensorium with  no motor or cerebellar deficits apparent.       CXR PA and Lateral:   02/07/2022 :    I personally reviewed images and agree with radiology impression as follows:     Labs ordered 02/07/2022  :  allergy profile   alpha one AT phenotype      Assessment   No problem-specific Assessment & Plan notes found for this encounter.     Corey Gully, MD 02/07/2022

## 2022-02-07 NOTE — Assessment & Plan Note (Signed)
Reports quit smoking around 2018 (wife's death)  and started on his wife's 02 and multple redundant inhalers - 02/07/2022  After extensive coaching inhaler device,  effectiveness =    75% from a baseline of < 25%  DDX of  difficult airways management almost all start with A and  include Adherence, Ace Inhibitors, Acid Reflux, Active Sinus Disease, Alpha 1 Antitripsin deficiency, Anxiety masquerading as Airways dz,  ABPA,  Allergy(esp in young), Aspiration (esp in elderly), Adverse effects of meds,  Active smoking or vaping, A bunch of PE's (a small clot burden can't cause this syndrome unless there is already severe underlying pulm or vascular dz with poor reserve) plus two Bs  = Bronchiectasis and Beta blocker use..and one C= CHF   Adherence is always the initial "prime suspect" and is a multilayered concern that requires a "trust but verify" approach in every patient - starting with knowing how to use medications, especially inhalers, correctly, keeping up with refills and understanding the fundamental difference between maintenance and prns vs those medications only taken for a very short course and then stopped and not refilled.  - see hfa teaching/ badly confusing maint and prns> see AB action plan on avs  - return with all meds in hand using a trust but verify approach to confirm accurate Medication  Reconciliation The principal here is that until we are certain that the  patients are doing what we've asked, it makes no sense to ask them to do more.   Active smoking (chart says he is but he says he quit 2018)   ? Allergy/ asthma > reports no better from prednisone but for now continue either symb 160 or bretri 2bid  And check eos/ IgE    ? ABPA  Check IgE   Anxiety / depression >  usually at the bottom of this list of usual suspects but should be much higher on this pt's based on H and P and note  Seeking  Psychotropics at initial ov  > paradoxically if lung dz is as bad as appears may need to  start palliative are with benzo's but I deferred this back to PCP ? Needs hospice referral too.    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 CHF > check bnp

## 2022-02-08 LAB — BASIC METABOLIC PANEL
BUN/Creatinine Ratio: 26 — ABNORMAL HIGH (ref 10–24)
BUN: 19 mg/dL (ref 8–27)
CO2: 29 mmol/L (ref 20–29)
Calcium: 10.2 mg/dL (ref 8.6–10.2)
Chloride: 100 mmol/L (ref 96–106)
Creatinine, Ser: 0.73 mg/dL — ABNORMAL LOW (ref 0.76–1.27)
Glucose: 111 mg/dL — ABNORMAL HIGH (ref 70–99)
Potassium: 4.4 mmol/L (ref 3.5–5.2)
Sodium: 149 mmol/L — ABNORMAL HIGH (ref 134–144)
eGFR: 94 mL/min/{1.73_m2} (ref >59)

## 2022-02-08 LAB — BRAIN NATRIURETIC PEPTIDE: BNP: 11.6 pg/mL (ref 0.0–100.0)

## 2022-02-09 ENCOUNTER — Encounter: Payer: Self-pay | Admitting: Internal Medicine

## 2022-02-10 LAB — CBC WITH DIFFERENTIAL/PLATELET
Basophils Absolute: 0 10*3/uL (ref 0.0–0.2)
Basos: 1 %
EOS (ABSOLUTE): 0.4 10*3/uL (ref 0.0–0.4)
Eos: 5 %
Hematocrit: 43.9 % (ref 37.5–51.0)
Hemoglobin: 14.9 g/dL (ref 13.0–17.7)
Immature Grans (Abs): 0 10*3/uL (ref 0.0–0.1)
Immature Granulocytes: 1 %
Lymphocytes Absolute: 0.7 10*3/uL (ref 0.7–3.1)
Lymphs: 9 %
MCH: 31.6 pg (ref 26.6–33.0)
MCHC: 33.9 g/dL (ref 31.5–35.7)
MCV: 93 fL (ref 79–97)
Monocytes Absolute: 0.7 10*3/uL (ref 0.1–0.9)
Monocytes: 8 %
Neutrophils Absolute: 6.1 10*3/uL (ref 1.4–7.0)
Neutrophils: 76 %
Platelets: 290 10*3/uL (ref 150–450)
RBC: 4.72 x10E6/uL (ref 4.14–5.80)
RDW: 12.2 % (ref 11.6–15.4)
WBC: 7.9 10*3/uL (ref 3.4–10.8)

## 2022-02-10 LAB — IGE: IgE (Immunoglobulin E), Serum: 145 IU/mL (ref 6–495)

## 2022-02-10 LAB — ALPHA-1-ANTITRYPSIN PHENOTYP: A-1 Antitrypsin: 98 mg/dL — ABNORMAL LOW (ref 101–187)

## 2022-02-15 ENCOUNTER — Telehealth: Payer: Self-pay | Admitting: Internal Medicine

## 2022-02-15 NOTE — Telephone Encounter (Signed)
Returned patients call and went over lab results with him. He voiced understanding and had no further questions. Nothing further needed.  ?

## 2022-03-24 ENCOUNTER — Telehealth: Payer: Self-pay | Admitting: Internal Medicine

## 2022-03-24 DIAGNOSIS — J449 Chronic obstructive pulmonary disease, unspecified: Secondary | ICD-10-CM

## 2022-03-24 NOTE — Telephone Encounter (Signed)
Called and spoke with patient who is calling because Dr. Melvyn Novas wanted him to have a CXR done and needs the order to be placed so that he can go get it done tomorrow. Order has been placed per note from AVS. Patient expressed understanding. Nothing further needed at this time. ?

## 2022-03-28 ENCOUNTER — Telehealth: Payer: Self-pay | Admitting: Internal Medicine

## 2022-03-28 MED ORDER — DOXYCYCLINE HYCLATE 100 MG PO TABS: 100.0000 mg | ORAL_TABLET | Freq: Two times a day (BID) | ORAL | 0 refills | Status: AC

## 2022-03-28 NOTE — Telephone Encounter (Signed)
Called and notified patient of medication and instructions. He voiced understanding. Nothing further needed at this time.  ?

## 2022-03-28 NOTE — Telephone Encounter (Signed)
Pt has a little more congestion.  States for him, it takes a stronger and for longers days than normal due to COPD.  Requesting an antibiotic called in.  [facility].  Please advise ? ?Primary Pulmonologist: Dr. Melvyn Novas  ?Last office visit and with whom: Dr. Melvyn Novas 02/07/2022 ?What do we see them for (pulmonary problems): COPD  ?Last OV assessment/plan: see below  ? ?Was appointment offered to patient (explain)?  No. None available at this time  ? ? ?Reason for call:  ?Patient has a lot of congestion going on for a few days  ?No fever.  ?No wheezing. ?Coughing up clear sputum  with green streaks ?No covid or flu tests  ?He states he has not had to increase his O2. Sats have remained above 90% on oxygen  ? ?Patient would like to know if he can have na antibiotic prescribed.  ?Dr. Melvyn Novas please advise.  ? ?No Known Allergies ? ? ?There is no immunization history on file for this patient.  ? ?Assessment  ?  ?COPD GOLD ?  ?Reports quit smoking around 02/26/2017 (wife's death)  and started on his wife's 02 and multple redundant inhalers ?- 02/07/2022  After extensive coaching inhaler device,  effectiveness =    75% from a baseline of < 25% ?  ?DDX of  difficult airways management almost all start with A and  include Adherence, Ace Inhibitors, Acid Reflux, Active Sinus Disease, Alpha 1 Antitripsin deficiency, Anxiety masquerading as Airways dz,  ABPA,  Allergy(esp in young), Aspiration (esp in elderly), Adverse effects of meds,  Active smoking or vaping, A bunch of PE's (a small clot burden can't cause this syndrome unless there is already severe underlying pulm or vascular dz with poor reserve) plus two Bs  = Bronchiectasis and Beta blocker use..and one C= CHF  ?  ?Adherence is always the initial "prime suspect" and is a multilayered concern that requires a "trust but verify" approach in every patient - starting with knowing how to use medications, especially inhalers, correctly, keeping up with refills and understanding the  fundamental difference between maintenance and prns vs those medications only taken for a very short course and then stopped and not refilled.  ?- see hfa teaching/ badly confusing maint and prns> see AB action plan on avs  ?- return with all meds in hand using a trust but verify approach to confirm accurate Medication  Reconciliation The principal here is that until we are certain that the  patients are doing what we've asked, it makes no sense to ask them to do more.  ?  ?Active smoking (chart says he is but he says he quit 02/26/17)  ?  ?? Allergy/ asthma > reports no better from prednisone but for now continue either symb 160 or bretri 2bid  And check eos/ IgE  ?  ? ? ABPA  Check IgE pending  ?  ?Anxiety / depression >  usually at the bottom of this list of usual suspects but should be much higher on this pt's based on H and P and note  Seeking  Psychotropics at initial ov  > paradoxically if lung dz is as bad as appears may need to start palliative are with benzo's but I deferred this back to PCP ? Needs hospice referral too.   ?  ?Active sinus dz > advised to exhale symb/breztri thru nose > ent eval next  ?  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  ?? CHF > note bnp very  low so unlikely  ?  ?Chronic respiratory failure with hypoxia ([facility]) ?Placed himself on his wife's 02 at her death ? 2017-02-16  ?  ?As of 2022/02/14 ok to use 2lpm and titrate daytime to sats > 90%  ?  ?  ?F/u in 3 m with all meds in hand using a trust but verify approach to confirm accurate Medication  Reconciliation The principal here is that until we are certain that the  patients are doing what we've asked, it makes no sense to ask them to do more.  ?  ?    ?Outpatient Encounter Medications as of February 14, 2022  ?Medication Sig  ? albuterol (PROVENTIL HFA;VENTOLIN HFA) 108 (90 Base) MCG/ACT inhaler Inhale 1-2 puffs into the lungs every 6 (six) hours as needed for wheezing or shortness of breath.  ? albuterol (PROVENTIL) (2.5 MG/3ML) 0.083% nebulizer solution  Take 3 mLs (2.5 mg total) by nebulization every 4 (four) hours as needed for wheezing or shortness of breath.  ? budesonide-formoterol (SYMBICORT) 160-4.5 MCG/ACT inhaler Inhale 2 puffs into the lungs 2 (two) times daily.  ? ipratropium (ATROVENT HFA) 17 MCG/ACT inhaler Inhale 2 puffs into the lungs every 6 (six) hours.  ? mometasone (NASONEX) 50 MCG/ACT nasal spray Place 1 spray into the nose 2 (two) times daily.   ? tamsulosin (FLOMAX) 0.4 MG CAPS capsule Take 0.4 mg by mouth daily.  ?      ?      ?      ? furosemide (LASIX) 40 MG tablet Take 40 mg by mouth daily.  ? levofloxacin (LEVAQUIN) 750 MG tablet Take 750 mg by mouth daily.  ? oxycodone (OXY-IR) 5 MG capsule Take 5 mg by mouth every 6 (six) hours as needed for pain.  (Patient not taking: Reported on 02/14/22)  ? oxymetazoline (AFRIN) 0.05 % nasal spray Place 1 spray into both nostrils daily as needed for congestion. (Patient not taking: Reported on 02-14-2022)  ? traZODone (DESYREL) 50 MG tablet Take 50 mg by mouth at bedtime. (Patient not taking: Reported on 02-14-2022)  ?  ?  ?  ?Each maintenance medication was reviewed in detail including emphasizing most importantly the difference between maintenance and prns and under what circumstances the prns are to be triggered using an action plan format where appropriate. ?  ?Total time for H and P, chart review, counseling, reviewing hfa  device(s) and generating customized AVS unique to this office visit / same day charting >  45 min ?     ?

## 2022-03-28 NOTE — Telephone Encounter (Signed)
Doxy 100 mg bid x 10 days  ?

## 2022-05-03 ENCOUNTER — Telehealth: Payer: Self-pay | Admitting: Internal Medicine

## 2022-05-03 ENCOUNTER — Other Ambulatory Visit: Payer: Self-pay

## 2022-05-03 MED ORDER — FUROSEMIDE 40 MG PO TABS: 40.0000 mg | ORAL_TABLET | Freq: Every day | ORAL | 0 refills | Status: DC

## 2022-05-03 NOTE — Telephone Encounter (Signed)
Called and notified patient and he Is aware refill is for this time only. Refill sent to [facility]. Nothing further needed.

## 2022-05-03 NOTE — Telephone Encounter (Signed)
Caleld and went over med list with patient. Fixed corrections he had and updated med list.   Patient asked if Dr. Melvyn Novas would refill his lasix for him. Suggested to patient that he contact PCP for refill since they prescribed but patient states he does not want PCP to handle his refill of lasix because he does not want to make another appointment with them and he really needs the refill for the lasix. Patient states he wants Dr. Melvyn Novas to handle his lasix refill. Patient wants 40 mg tablets with 90 day supply.  Dr. Melvyn Novas please advise, is this something you're okay with [place] refilling?

## 2022-05-03 NOTE — Telephone Encounter (Signed)
Ok x 1 refill further refills per pcp who needs to check electrolytes/ renal functin

## 2022-05-18 ENCOUNTER — Ambulatory Visit: Payer: Medicare Other | Admitting: Internal Medicine

## 2022-05-23 ENCOUNTER — Other Ambulatory Visit: Payer: Self-pay

## 2022-05-23 ENCOUNTER — Telehealth: Payer: Self-pay | Admitting: Internal Medicine

## 2022-05-23 MED ORDER — BUDESONIDE-FORMOTEROL FUMARATE 160-4.5 MCG/ACT IN AERO: 2.0000 | INHALATION_SPRAY | Freq: Two times a day (BID) | RESPIRATORY_TRACT | 6 refills | Status: DC

## 2022-06-16 ENCOUNTER — Ambulatory Visit: Payer: Medicare Other | Admitting: Internal Medicine

## 2022-06-23 ENCOUNTER — Telehealth: Payer: Self-pay | Admitting: Internal Medicine

## 2022-06-23 MED ORDER — FUROSEMIDE 40 MG PO TABS: 40.0000 mg | ORAL_TABLET | Freq: Every day | ORAL | 0 refills | Status: DC

## 2022-06-23 NOTE — Telephone Encounter (Signed)
Patient voiced understanding. Patient is asking if we can refill his medication one more time before his ov.

## 2022-06-23 NOTE — Telephone Encounter (Addendum)
Called patient and notified that refill was sent in to [facility]. Patient asked if there were any companies that could come to his home to draw his blood if he couldn't make it to his next appt. I advised patient that I was not aware of any companies affiliated with [facility] that go to patients homes to draw blood. Nothing further needed.

## 2022-06-23 NOTE — Telephone Encounter (Signed)
Called and spoke to patient and he states that he is not getting enough fluid off with the lasix at 80 mg/day. He is taking 40 mg twice a day. Verified other  medications listed with patient while on phone call.  Patient is very concerned and states his legs are hurting because of the fluid. He would like to increase his lasix dose to 100 mg/ day or higher if Dr. Melvyn Novas recommends.   patient does not want PCP to handle his lasix and wants Dr. Melvyn Novas to handle dosage    Please advise.

## 2022-06-23 NOTE — Telephone Encounter (Signed)
Called patient and gave him response from Dr. Melvyn Novas. Patient states he is unable to get out of the house  to do labs because he cannot walk because of the pain from the fluid. He refuses to go to the ER. He states he has an appt on 8/31 in [place] and will have to have assistance to come to appt. He wants to know if Dr. Melvyn Novas will authorize higher dose until upcoming appt and wants to get labs at that time.  Please advise.   (Offered patient sooner appt and he declines stating he cannot get transportation/help getting her before 8/31.)

## 2022-06-23 NOTE — Telephone Encounter (Signed)
Will need to have bmet monitored  before adjusting and I don't see labs since 02/07/22 so  recm check cmet bnp and tsh today and I'll try to help him adjust meds before weekend or can go to er and get this done if in that much pain

## 2022-06-23 NOTE — Telephone Encounter (Signed)
yes

## 2022-06-23 NOTE — Telephone Encounter (Signed)
He has to take it at his  own risk then one extra prn and to er in meantime if any worsening of leg swelling or problems breathing

## 2022-06-29 ENCOUNTER — Ambulatory Visit: Payer: Medicare Other | Admitting: Internal Medicine

## 2022-07-20 ENCOUNTER — Telehealth: Payer: Self-pay | Admitting: Internal Medicine

## 2022-07-20 MED ORDER — CEFDINIR 300 MG PO CAPS: 300.0000 mg | ORAL_CAPSULE | Freq: Two times a day (BID) | ORAL | 0 refills | Status: AC

## 2022-07-20 MED ORDER — PREDNISONE 10 MG PO TABS: ORAL_TABLET | ORAL | 0 refills | Status: AC

## 2022-07-20 NOTE — Telephone Encounter (Signed)
Omnicef 300 mg bid x 10 days   Prednisone 10 mg take  4 each am x 2 days,   2 each am x 2 days,  1 each am x 2 days and stop

## 2022-07-20 NOTE — Telephone Encounter (Signed)
Primary Pulmonologist: Dr. Melvyn Novas  Last office visit and with whom: 2022-03-03 Dr. Melvyn Novas  What do we see them for (pulmonary problems): COPD GOLD  Last OV assessment/plan: see below   Was appointment offered to patient (explain)?  pt has an appt next week and cannot come in sooner than that due to transportation    Reason for call: Patient called stating for the last few days he has has been coughing up yellow and light brown sputum. He has been wheezing and more SOB than usual. No fevers. Patient is asking if we can send in an antibiotic for him. He is asking for a "strong antibiotic that is at least 10 days long" Dr. Melvyn Novas please advise   No Known Allergies   There is no immunization history on file for this patient.   Assessment    COPD GOLD ?  Reports quit smoking around 03-04-17 (wife's death)  and started on his wife's 02 and multple redundant inhalers - 2022/03/03  After extensive coaching inhaler device,  effectiveness =    75% from a baseline of < 25%   DDX of  difficult airways management almost all start with A and  include Adherence, Ace Inhibitors, Acid Reflux, Active Sinus Disease, Alpha 1 Antitripsin deficiency, Anxiety masquerading as Airways dz,  ABPA,  Allergy(esp in young), Aspiration (esp in elderly), Adverse effects of meds,  Active smoking or vaping, A bunch of PE's (a small clot burden can't cause this syndrome unless there is already severe underlying pulm or vascular dz with poor reserve) plus two Bs  = Bronchiectasis and Beta blocker use..and one C= CHF    Adherence is always the initial "prime suspect" and is a multilayered concern that requires a "trust but verify" approach in every patient - starting with knowing how to use medications, especially inhalers, correctly, keeping up with refills and understanding the fundamental difference between maintenance and prns vs those medications only taken for a very short course and then stopped and not refilled.  - see hfa  teaching/ badly confusing maint and prns> see AB action plan on avs  - return with all meds in hand using a trust but verify approach to confirm accurate Medication  Reconciliation The principal here is that until we are certain that the  patients are doing what we've asked, it makes no sense to ask them to do more.    Active smoking (chart says he is but he says he quit 03/04/2017)    ? Allergy/ asthma > reports no better from prednisone but for now continue either symb 160 or bretri 2bid  And check eos/ IgE     ? ABPA  Check IgE pending    Anxiety / depression >  usually at the bottom of this list of usual suspects but should be much higher on this pt's based on H and P and note  Seeking  Psychotropics at initial ov  > paradoxically if lung dz is as bad as appears may need to start palliative are with benzo's but I deferred this back to PCP ? Needs hospice referral too.     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 CHF > note bnp very low so unlikely    Chronic respiratory failure with hypoxia [facility]) Placed himself on his wife's 02 at her death ? Mar 04, 2017    As of March 03, 2022 ok to use 2lpm  and titrate daytime to sats > 90%      F/u in 3 m with all meds in hand using a trust but verify approach to confirm accurate Medication  Reconciliation The principal here is that until we are certain that the  patients are doing what we've asked, it makes no sense to ask them to do more.        Outpatient Encounter Medications as of 02/07/2022  Medication Sig   albuterol (PROVENTIL HFA;VENTOLIN HFA) 108 (90 Base) MCG/ACT inhaler Inhale 1-2 puffs into the lungs every 6 (six) hours as needed for wheezing or shortness of breath.   albuterol (PROVENTIL) (2.5 MG/3ML) 0.083% nebulizer solution Take 3 mLs (2.5 mg total) by nebulization every 4 (four) hours as needed for wheezing or shortness of breath.   budesonide-formoterol (SYMBICORT) 160-4.5  MCG/ACT inhaler Inhale 2 puffs into the lungs 2 (two) times daily.   ipratropium (ATROVENT HFA) 17 MCG/ACT inhaler Inhale 2 puffs into the lungs every 6 (six) hours.   mometasone (NASONEX) 50 MCG/ACT nasal spray Place 1 spray into the nose 2 (two) times daily.    tamsulosin (FLOMAX) 0.4 MG CAPS capsule Take 0.4 mg by mouth daily.                     furosemide (LASIX) 40 MG tablet Take 40 mg by mouth daily.   levofloxacin (LEVAQUIN) 750 MG tablet Take 750 mg by mouth daily.   oxycodone (OXY-IR) 5 MG capsule Take 5 mg by mouth every 6 (six) hours as needed for pain.  (Patient not taking: Reported on 02/07/2022)   oxymetazoline (AFRIN) 0.05 % nasal spray Place 1 spray into both nostrils daily as needed for congestion. (Patient not taking: Reported on 02/07/2022)   traZODone (DESYREL) 50 MG tablet Take 50 mg by mouth at bedtime. (Patient not taking: Reported on 02/07/2022)        Each maintenance medication was reviewed in detail including emphasizing most importantly the difference between maintenance and prns and under what circumstances the prns are to be triggered using an action plan format where appropriate.   Total time for H and P, chart review, counseling, reviewing hfa  device(s) and generating customized AVS unique to this office visit / same day charting >  45 min

## 2022-07-20 NOTE — Telephone Encounter (Signed)
Called and went over recs with patient and he voiced understanding.  Reminded him of appt on 8/31. Nothing further needed

## 2022-07-28 ENCOUNTER — Telehealth: Payer: Self-pay | Admitting: Internal Medicine

## 2022-07-28 ENCOUNTER — Ambulatory Visit: Payer: Medicare Other | Admitting: Internal Medicine

## 2022-07-28 NOTE — Progress Notes (Deleted)
Corey Griffin, male    DOB: 06-Apr-1946,    MRN: [id number]   Brief patient profile:  43  yowm  quit smoking ? 2016/MZ   referred to pulmonary clinic in [place]  02/07/2022 by Dr Delphina Cahill  for copd/02 dep dx around 2012       History of Present Illness  02/07/2022  Pulmonary/ 1st office eval/ Corey Griffin / [place] [facility]  Chief Complaint  Patient presents with   Consult    Consult for COPD and O2 dependence   Dyspnea:  40 ft and catch breath on 02 2.5lpm sats 88% though adjusts up to 4lpm does not really titrate Cough: very hoarse /no sputum  Sleep: in recliner x 45 degrees x 2021  around 2lpm 24/7 not checking sats  SABA use: duoneb bid  very disorganized with maint vs prn / says no better on pred Rec Plan A = Automatic = Always=    Symbicort  160 Take 2 puffs first thing in am and then another 2 puffs about 12 hours later.  Work on inhaler technique:  If you substitue Breztri for symbicort then Plan B is just albuterol, not with ipatropium anoro or spiriva as these will interact adversely  Plan B = Backup (to supplement plan A, not to replace it) Only use your albuterol-ipatropium nebulizer as a rescue medication Make sure you check your oxygen saturation  AT  your highest level of activity (not after you stop)   to be sure it stays over 90%   Please remember to go to the  x-ray department> did not go for cxr      Please schedule a follow up visit in 3 months but call sooner if needed  - bring all active meds  - Allergy screen 02/07/22 >  Eos 0.4 /  IgE  145 - Alpha one AT phenotype 02/07/22  =  MM  Level 98    07/28/2022  f/u ov/[place] office/Amyiah Gaba re: *** maint on ***   needs to go for cxr  No chief complaint on file.   Dyspnea:  *** Cough: *** Sleeping: *** SABA use: *** 02: *** Covid status: *** Lung cancer screening: ***   No obvious day to day or daytime variability or assoc excess/ purulent sputum or mucus plugs or hemoptysis or cp or chest tightness, subjective  wheeze or overt sinus or hb symptoms.   *** without nocturnal  or early am exacerbation  of respiratory  c/o's or need for noct saba. Also denies any obvious fluctuation of symptoms with weather or environmental changes or other aggravating or alleviating factors except as outlined above   No unusual exposure hx or h/o childhood pna/ asthma or knowledge of premature birth.  Current Allergies, Complete Past Medical History, Past Surgical History, Family History, and Social History were reviewed in [employer] record.  ROS  The following are not active complaints unless bolded Hoarseness, sore throat, dysphagia, dental problems, itching, sneezing,  nasal congestion or discharge of excess mucus or purulent secretions, ear ache,   fever, chills, sweats, unintended wt loss or wt gain, classically pleuritic or exertional cp,  orthopnea pnd or arm/hand swelling  or leg swelling, presyncope, palpitations, abdominal pain, anorexia, nausea, vomiting, diarrhea  or change in bowel habits or change in bladder habits, change in stools or change in urine, dysuria, hematuria,  rash, arthralgias, visual complaints, headache, numbness, weakness or ataxia or problems with walking or coordination,  change in mood or  memory.  No outpatient medications have been marked as taking for the 07/28/22 encounter (Appointment) with Tanda Rockers, MD.                   Past Medical History:  Diagnosis Date   COPD (chronic obstructive pulmonary disease) ([place])        Objective:     07/28/2022        ***   09/04/18 165 lb (74.8 kg)  01/01/17 170 lb (77.1 kg)  03/18/16 180 lb (81.6 kg)      Vital signs reviewed  07/28/2022  - Note at rest 02 sats  ***% on ***   General appearance:    ***       Mod bar ***            Labs ordered 02/07/2022  :  allergy profile   alpha one AT phenotype              Assessment

## 2022-07-28 NOTE — Telephone Encounter (Signed)
Pt asking about oxygen supplies and a wheelchair being sent to [facility].  Please advise.  Stated any forms that need to be signed to have mailed to him.   Dr. Melvyn Novas please advise is this okay to place order for both?

## 2022-08-02 NOTE — Telephone Encounter (Signed)
Missed last appt and I can't do any justifcation/documentation s seeing him   His PCP is Dr Nevada Crane and might could help  with the wheelchair   but I would need to see pt first for sure for the 02

## 2022-08-03 NOTE — Telephone Encounter (Signed)
Called and notified patient of response and he voiced understanding. Patient was inquiring about a refill on lasix and advised him that per Dr. Morrison Old last message, he would not refill until he saw the patient. Patient states there is a form that he needs to fill out to have Dr. Melvyn Novas to sign so patient can get his refill on lasix since he is a terminally ill patient. I offered to send a message to Dr. Melvyn Novas to inquire about what form patient is referring to and patient declines for now. Nothing further needed at this time.

## 2022-08-11 ENCOUNTER — Telehealth: Payer: Self-pay | Admitting: Internal Medicine

## 2022-08-15 NOTE — Telephone Encounter (Signed)
Called and spoke to patient and he states he is not on hospice care. Patient states he is not able to make it into the office, last time he had an appt scheduled with Dr. Melvyn Novas, he fell on his way to the office and is unable to come in for an appointment anytime soon.   Patient states he is taking  80 mg-100 mg  lasix a day and wants a refill. He said he "needs this medication or he will die"  He also states that Dr. Melvyn Novas told him about the terminal patients form that he could sign so that Dr. Melvyn Novas would give him endless refills on lasix. I did advise patient that I am not familiar with what form he is talking about and that we do not have that form in our office to my knowledge. He states Dr. Melvyn Novas was going to give him this form to sign at his next appt.   Patient would like a call from Dr. Melvyn Novas or a nurse regarding both of these. Dr. Melvyn Novas please advise.

## 2022-08-15 NOTE — Telephone Encounter (Signed)
Could not get him on the number he left with [place] - if he'll give another number or a window during the day I will try again but here's what I wanted to tell him  Let him know I can do one refill on his lasix but will defer all care back to his PCP as we can't take care of him thru this clinic if he cannot physically come to the office and would recommend he consider asking to be referred to hospice but I would defer that decision to his PCP

## 2022-08-15 NOTE — Telephone Encounter (Signed)
I understood he was on hospice I have not participated in that part of his care so whoever is his hospice attending can decide what medicines to refill and how to use them

## 2022-08-15 NOTE — Telephone Encounter (Signed)
"  Pt needs refill on Lasix.  Also needing Dr. Melvyn Novas fill out "Terminal patient's right to choose form" and sign and send to pt.  See previous encounter for any information.  [facility]. "  Dr. Melvyn Novas please advise, I have informed patient multiple times that you do not want to refill lasix until he is seen and am unaware of what form he is referring to. He states that you have told him about this form and wants to fill it out so you can refill his lasix? Patient is persistent on both of these issues.

## 2022-08-15 NOTE — Telephone Encounter (Signed)
Discussed with pt - refill lasix x one he will f/u with Dr Nevada Crane  Copy to Dr Nevada Crane

## 2022-08-16 ENCOUNTER — Other Ambulatory Visit: Payer: Self-pay

## 2022-08-16 MED ORDER — FUROSEMIDE 40 MG PO TABS: 40.0000 mg | ORAL_TABLET | Freq: Every day | ORAL | 0 refills | Status: AC

## 2022-08-16 NOTE — Telephone Encounter (Signed)
Refill sent in to [facility] and note sent to Dr. Nevada Crane. Nothing further needed

## 2023-03-10 ENCOUNTER — Telehealth: Payer: Self-pay | Admitting: *Deleted

## 2023-03-10 MED ORDER — AZITHROMYCIN 250 MG PO TABS: ORAL_TABLET | ORAL | 0 refills | Status: AC

## 2023-03-10 MED ORDER — PREDNISONE 10 MG PO TABS: ORAL_TABLET | ORAL | 0 refills | Status: AC

## 2023-03-10 NOTE — Telephone Encounter (Signed)
Patient last seen OV 01/2022 by MW and has multiple cancellations for follow up care since. Patient has been back to his PCP and PCP sent over a referral for our office on 02/01/23.   Spoke with patient on 02/09/23 after he returned call to get scheduled, but patient refused due to wanting to speak with PCP to discuss treatment options. Patient verbalized understanding that he could call and schedule an appointment at any time.  Patient called today 03/10/23 stating that he has a chest infection and would like an antibiotic. Offered an appointment for Monday but patient refused. Offered other appointments for next week, but patient also refused those as well. Patient states that he is unable to get in and out of the house.   Please call and advise patient. [phone]

## 2023-03-10 NOTE — Telephone Encounter (Signed)
Called and spoke w/ pt prescriptions sent to [facility] as requested. He verbalized understanding that he would need to come in for OV for further care per MW.   Closing encounter

## 2023-03-10 NOTE — Telephone Encounter (Signed)
Not seen in over a year- since it is late on a Friday pm rec Ok to try zpak and Prednisone 10 mg take  4 each am x 2 days,   2 each am x 2 days,  1 each am x 2 days and stop but no further phone care feasible from this office s ov at least yearly

## 2023-03-10 NOTE — Telephone Encounter (Signed)
Called and spoke w/ he verbalized that for 2 weeks symps are coughing, wheezing, worsening SOB, tight feeling in his chest. He is currently using 3L O2 w/ sats @ 95% HR 104  He reports that he is w/c bound and is unable to come to the office for visits.   He denies going to the emergency room or calling 911.   Dr.Wert please advise?

## 2023-03-16 ENCOUNTER — Telehealth: Payer: Self-pay | Admitting: Internal Medicine

## 2023-03-16 DIAGNOSIS — J441 Chronic obstructive pulmonary disease with (acute) exacerbation: Secondary | ICD-10-CM

## 2023-03-16 NOTE — Telephone Encounter (Signed)
Left message for patient to call back  

## 2023-03-16 NOTE — Telephone Encounter (Signed)
PT asking for Dr. Thurston Hole nurse about medication. When asked to elaborate he was too short of breath to do so. Sending back [facility] [employer] as he did not sound well at all. TY.

## 2023-03-24 MED ORDER — CEFDINIR 300 MG PO CAPS: 300.0000 mg | ORAL_CAPSULE | Freq: Two times a day (BID) | ORAL | 0 refills | Status: DC

## 2023-03-24 NOTE — Telephone Encounter (Signed)
Omnicef 300mg bid x 10 days

## 2023-03-24 NOTE — Telephone Encounter (Signed)
Called and spoke w/ pt verbalized understanding that I am calling in medicine per MW.   He verbalized understanding and someone is going to pick it up for him now. NFN at time of call.

## 2023-03-24 NOTE — Telephone Encounter (Signed)
Patient calling back to discuss medications that Dr. Sherene Sires prescribed. Patient states he has not heard from anyone   Please call and advise patient [phone]

## 2023-03-24 NOTE — Telephone Encounter (Signed)
Called and spoke w/ pt he verbalized that the z-pak or prednisone did not help him at all. He is still having drops in his sats (lowest was 82%) - he is requesting that Dr.Wert prescribe him [place] because it helps him clear up completely.   Dr.Wert please advise pt states that he will come in for OV after round. He verbalized that he understands he has not been seen but is currently w/c bound and has  difficulty finding transportation.

## 2023-04-11 ENCOUNTER — Other Ambulatory Visit: Payer: Self-pay | Admitting: Internal Medicine

## 2023-05-29 ENCOUNTER — Ambulatory Visit: Payer: 59 | Admitting: Internal Medicine

## 2023-06-22 ENCOUNTER — Ambulatory Visit: Payer: 59 | Admitting: Internal Medicine

## 2023-09-10 ENCOUNTER — Encounter (HOSPITAL_COMMUNITY): Payer: Self-pay | Admitting: Emergency Medicine

## 2023-09-10 ENCOUNTER — Other Ambulatory Visit: Payer: Self-pay

## 2023-09-10 ENCOUNTER — Inpatient Hospital Stay (HOSPITAL_COMMUNITY)
Admission: EM | Admit: 2023-09-10 | Discharge: 2023-09-29 | DRG: 853 | Disposition: E | Payer: 59 | Attending: Family Medicine | Admitting: Family Medicine

## 2023-09-10 ENCOUNTER — Emergency Department (HOSPITAL_COMMUNITY): Payer: 59 | Admitting: Anesthesiology

## 2023-09-10 ENCOUNTER — Encounter (HOSPITAL_COMMUNITY): Admission: EM | Disposition: E | Payer: Self-pay | Source: Home / Self Care | Attending: Family Medicine

## 2023-09-10 ENCOUNTER — Emergency Department (HOSPITAL_COMMUNITY): Payer: 59

## 2023-09-10 ENCOUNTER — Inpatient Hospital Stay (HOSPITAL_COMMUNITY): Payer: 59

## 2023-09-10 DIAGNOSIS — M549 Dorsalgia, unspecified: Secondary | ICD-10-CM | POA: Diagnosis not present

## 2023-09-10 DIAGNOSIS — J95822 Acute and chronic postprocedural respiratory failure: Secondary | ICD-10-CM | POA: Diagnosis not present

## 2023-09-10 DIAGNOSIS — R64 Cachexia: Secondary | ICD-10-CM | POA: Diagnosis present

## 2023-09-10 DIAGNOSIS — Z66 Do not resuscitate: Secondary | ICD-10-CM | POA: Diagnosis not present

## 2023-09-10 DIAGNOSIS — L89153 Pressure ulcer of sacral region, stage 3: Secondary | ICD-10-CM | POA: Diagnosis present

## 2023-09-10 DIAGNOSIS — A419 Sepsis, unspecified organism: Principal | ICD-10-CM | POA: Diagnosis present

## 2023-09-10 DIAGNOSIS — Y95 Nosocomial condition: Secondary | ICD-10-CM | POA: Diagnosis not present

## 2023-09-10 DIAGNOSIS — K567 Ileus, unspecified: Secondary | ICD-10-CM | POA: Diagnosis not present

## 2023-09-10 DIAGNOSIS — E8770 Fluid overload, unspecified: Secondary | ICD-10-CM | POA: Diagnosis not present

## 2023-09-10 DIAGNOSIS — E43 Unspecified severe protein-calorie malnutrition: Secondary | ICD-10-CM | POA: Diagnosis present

## 2023-09-10 DIAGNOSIS — G9341 Metabolic encephalopathy: Secondary | ICD-10-CM | POA: Diagnosis not present

## 2023-09-10 DIAGNOSIS — J9600 Acute respiratory failure, unspecified whether with hypoxia or hypercapnia: Secondary | ICD-10-CM

## 2023-09-10 DIAGNOSIS — Z1152 Encounter for screening for COVID-19: Secondary | ICD-10-CM | POA: Diagnosis not present

## 2023-09-10 DIAGNOSIS — K659 Peritonitis, unspecified: Secondary | ICD-10-CM | POA: Diagnosis present

## 2023-09-10 DIAGNOSIS — Z789 Other specified health status: Secondary | ICD-10-CM

## 2023-09-10 DIAGNOSIS — Z7189 Other specified counseling: Secondary | ICD-10-CM | POA: Diagnosis not present

## 2023-09-10 DIAGNOSIS — K5909 Other constipation: Secondary | ICD-10-CM | POA: Diagnosis present

## 2023-09-10 DIAGNOSIS — J90 Pleural effusion, not elsewhere classified: Secondary | ICD-10-CM | POA: Diagnosis not present

## 2023-09-10 DIAGNOSIS — F17210 Nicotine dependence, cigarettes, uncomplicated: Secondary | ICD-10-CM | POA: Diagnosis present

## 2023-09-10 DIAGNOSIS — G894 Chronic pain syndrome: Secondary | ICD-10-CM | POA: Diagnosis present

## 2023-09-10 DIAGNOSIS — R6521 Severe sepsis with septic shock: Secondary | ICD-10-CM | POA: Diagnosis present

## 2023-09-10 DIAGNOSIS — K631 Perforation of intestine (nontraumatic): Secondary | ICD-10-CM | POA: Diagnosis present

## 2023-09-10 DIAGNOSIS — J9601 Acute respiratory failure with hypoxia: Secondary | ICD-10-CM | POA: Diagnosis not present

## 2023-09-10 DIAGNOSIS — K4090 Unilateral inguinal hernia, without obstruction or gangrene, not specified as recurrent: Secondary | ICD-10-CM | POA: Diagnosis present

## 2023-09-10 DIAGNOSIS — K9189 Other postprocedural complications and disorders of digestive system: Secondary | ICD-10-CM | POA: Diagnosis not present

## 2023-09-10 DIAGNOSIS — Z515 Encounter for palliative care: Secondary | ICD-10-CM | POA: Diagnosis not present

## 2023-09-10 DIAGNOSIS — E876 Hypokalemia: Secondary | ICD-10-CM | POA: Diagnosis not present

## 2023-09-10 DIAGNOSIS — J9602 Acute respiratory failure with hypercapnia: Secondary | ICD-10-CM | POA: Diagnosis not present

## 2023-09-10 DIAGNOSIS — J9811 Atelectasis: Secondary | ICD-10-CM | POA: Diagnosis present

## 2023-09-10 DIAGNOSIS — K255 Chronic or unspecified gastric ulcer with perforation: Secondary | ICD-10-CM | POA: Diagnosis present

## 2023-09-10 DIAGNOSIS — J440 Chronic obstructive pulmonary disease with acute lower respiratory infection: Secondary | ICD-10-CM | POA: Diagnosis not present

## 2023-09-10 DIAGNOSIS — Z79899 Other long term (current) drug therapy: Secondary | ICD-10-CM

## 2023-09-10 DIAGNOSIS — R54 Age-related physical debility: Secondary | ICD-10-CM | POA: Diagnosis present

## 2023-09-10 DIAGNOSIS — J918 Pleural effusion in other conditions classified elsewhere: Secondary | ICD-10-CM | POA: Diagnosis present

## 2023-09-10 DIAGNOSIS — Z6821 Body mass index (BMI) 21.0-21.9, adult: Secondary | ICD-10-CM

## 2023-09-10 DIAGNOSIS — N400 Enlarged prostate without lower urinary tract symptoms: Secondary | ICD-10-CM | POA: Diagnosis present

## 2023-09-10 DIAGNOSIS — J810 Acute pulmonary edema: Secondary | ICD-10-CM | POA: Diagnosis not present

## 2023-09-10 DIAGNOSIS — J811 Chronic pulmonary edema: Secondary | ICD-10-CM | POA: Diagnosis present

## 2023-09-10 DIAGNOSIS — Z9981 Dependence on supplemental oxygen: Secondary | ICD-10-CM

## 2023-09-10 DIAGNOSIS — K251 Acute gastric ulcer with perforation: Secondary | ICD-10-CM | POA: Diagnosis not present

## 2023-09-10 DIAGNOSIS — Z7951 Long term (current) use of inhaled steroids: Secondary | ICD-10-CM

## 2023-09-10 DIAGNOSIS — J690 Pneumonitis due to inhalation of food and vomit: Secondary | ICD-10-CM | POA: Diagnosis not present

## 2023-09-10 HISTORY — PX: LAPAROTOMY: SHX154

## 2023-09-10 LAB — URINALYSIS, ROUTINE W REFLEX MICROSCOPIC
Bacteria, UA: NONE SEEN
Bilirubin Urine: NEGATIVE
Glucose, UA: NEGATIVE mg/dL
Ketones, ur: NEGATIVE mg/dL
Leukocytes,Ua: NEGATIVE
Nitrite: NEGATIVE
Protein, ur: NEGATIVE mg/dL
Specific Gravity, Urine: 1.019 (ref 1.005–1.030)
pH: 5 (ref 5.0–8.0)

## 2023-09-10 LAB — CBC WITH DIFFERENTIAL/PLATELET
Abs Immature Granulocytes: 0.2 10*3/uL — ABNORMAL HIGH (ref 0.00–0.07)
Basophils Absolute: 0.1 10*3/uL (ref 0.0–0.1)
Basophils Relative: 0 %
Eosinophils Absolute: 0.1 10*3/uL (ref 0.0–0.5)
Eosinophils Relative: 1 %
HCT: 39.3 % (ref 39.0–52.0)
Hemoglobin: 12.2 g/dL — ABNORMAL LOW (ref 13.0–17.0)
Immature Granulocytes: 1 %
Lymphocytes Relative: 3 %
Lymphs Abs: 0.5 10*3/uL — ABNORMAL LOW (ref 0.7–4.0)
MCH: 31.1 pg (ref 26.0–34.0)
MCHC: 31 g/dL (ref 30.0–36.0)
MCV: 100.3 fL — ABNORMAL HIGH (ref 80.0–100.0)
Monocytes Absolute: 1.2 10*3/uL — ABNORMAL HIGH (ref 0.1–1.0)
Monocytes Relative: 7 %
Neutro Abs: 14 10*3/uL — ABNORMAL HIGH (ref 1.7–7.7)
Neutrophils Relative %: 88 %
Platelets: 617 10*3/uL — ABNORMAL HIGH (ref 150–400)
RBC: 3.92 MIL/uL — ABNORMAL LOW (ref 4.22–5.81)
RDW: 15.8 % — ABNORMAL HIGH (ref 11.5–15.5)
WBC: 16.1 10*3/uL — ABNORMAL HIGH (ref 4.0–10.5)
nRBC: 0 % (ref 0.0–0.2)

## 2023-09-10 LAB — COMPREHENSIVE METABOLIC PANEL WITH GFR
ALT: 18 U/L (ref 0–44)
AST: 22 U/L (ref 15–41)
Albumin: 2.8 g/dL — ABNORMAL LOW (ref 3.5–5.0)
Alkaline Phosphatase: 185 U/L — ABNORMAL HIGH (ref 38–126)
Anion gap: 13 (ref 5–15)
BUN: 39 mg/dL — ABNORMAL HIGH (ref 8–23)
CO2: 32 mmol/L (ref 22–32)
Calcium: 9.3 mg/dL (ref 8.9–10.3)
Chloride: 92 mmol/L — ABNORMAL LOW (ref 98–111)
Creatinine, Ser: 0.81 mg/dL (ref 0.61–1.24)
GFR, Estimated: >60 mL/min
Glucose, Bld: 108 mg/dL — ABNORMAL HIGH (ref 70–99)
Potassium: 5 mmol/L (ref 3.5–5.1)
Sodium: 137 mmol/L (ref 135–145)
Total Bilirubin: 0.9 mg/dL (ref 0.3–1.2)
Total Protein: 8 g/dL (ref 6.5–8.1)

## 2023-09-10 LAB — LIPASE, BLOOD: Lipase: 20 U/L (ref 11–51)

## 2023-09-10 LAB — POCT I-STAT 7, (LYTES, BLD GAS, ICA,H+H)
Acid-Base Excess: 7 mmol/L — ABNORMAL HIGH (ref 0.0–2.0)
Bicarbonate: 33 mmol/L — ABNORMAL HIGH (ref 20.0–28.0)
Calcium, Ion: 1.16 mmol/L (ref 1.15–1.40)
HCT: 35 % — ABNORMAL LOW (ref 39.0–52.0)
Hemoglobin: 11.9 g/dL — ABNORMAL LOW (ref 13.0–17.0)
O2 Saturation: 89 %
Potassium: 3.8 mmol/L (ref 3.5–5.1)
Sodium: 134 mmol/L — ABNORMAL LOW (ref 135–145)
TCO2: 35 mmol/L — ABNORMAL HIGH (ref 22–32)
pCO2 arterial: 52.8 mm[Hg] — ABNORMAL HIGH (ref 32–48)
pH, Arterial: 7.404 (ref 7.35–7.45)
pO2, Arterial: 58 mm[Hg] — ABNORMAL LOW (ref 83–108)

## 2023-09-10 LAB — PROCALCITONIN: Procalcitonin: 0.21 ng/mL

## 2023-09-10 LAB — GLUCOSE, CAPILLARY: Glucose-Capillary: 92 mg/dL (ref 70–99)

## 2023-09-10 LAB — SARS CORONAVIRUS 2 BY RT PCR: SARS Coronavirus 2 by RT PCR: NEGATIVE

## 2023-09-10 LAB — LACTIC ACID, PLASMA: Lactic Acid, Venous: 1.2 mmol/L (ref 0.5–1.9)

## 2023-09-10 LAB — TYPE AND SCREEN
ABO/RH(D): O NEG
Antibody Screen: NEGATIVE

## 2023-09-10 SURGERY — LAPAROTOMY, EXPLORATORY
Anesthesia: General

## 2023-09-10 MED ORDER — LACTATED RINGERS IV SOLN
INTRAVENOUS | Status: DC | PRN
Start: 2023-09-10 — End: 2023-09-10

## 2023-09-10 MED ORDER — FLUCONAZOLE IN SODIUM CHLORIDE 400-0.9 MG/200ML-% IV SOLN
400.0000 mg | INTRAVENOUS | Status: DC
  Administered 2023-09-11 – 2023-09-18 (×7): 400 mg via INTRAVENOUS
  Filled 2023-09-10 (×9): qty 200

## 2023-09-10 MED ORDER — MIDAZOLAM HCL 5 MG/5ML IJ SOLN
INTRAMUSCULAR | Status: DC | PRN
  Administered 2023-09-10: 2 mg via INTRAVENOUS

## 2023-09-10 MED ORDER — SODIUM CHLORIDE 0.9 % IV SOLN
2.0000 g | INTRAVENOUS | Status: AC
  Administered 2023-09-10: 2 g via INTRAVENOUS
  Filled 2023-09-10: qty 2

## 2023-09-10 MED ORDER — DOCUSATE SODIUM 50 MG/5ML PO LIQD: 100.0000 mg | Freq: Two times a day (BID) | ORAL | Status: DC

## 2023-09-10 MED ORDER — FLUCONAZOLE IN SODIUM CHLORIDE 400-0.9 MG/200ML-% IV SOLN
800.0000 mg | Freq: Once | INTRAVENOUS | Status: AC
  Administered 2023-09-11: 800 mg via INTRAVENOUS
  Filled 2023-09-10: qty 400

## 2023-09-10 MED ORDER — METRONIDAZOLE 500 MG/100ML IV SOLN
500.0000 mg | Freq: Once | INTRAVENOUS | Status: AC
  Administered 2023-09-10: 500 mg via INTRAVENOUS
  Filled 2023-09-10: qty 100

## 2023-09-10 MED ORDER — SUCCINYLCHOLINE CHLORIDE 20 MG/ML IJ SOLN
INTRAMUSCULAR | Status: DC | PRN
  Administered 2023-09-10: 100 mg via INTRAVENOUS

## 2023-09-10 MED ORDER — FENTANYL CITRATE PF 50 MCG/ML IJ SOSY: 50.0000 ug | PREFILLED_SYRINGE | INTRAMUSCULAR | Status: DC | PRN

## 2023-09-10 MED ORDER — BUPIVACAINE HCL (PF) 0.5 % IJ SOLN
INTRAMUSCULAR | Status: AC
  Filled 2023-09-10: qty 30

## 2023-09-10 MED ORDER — CHLORHEXIDINE GLUCONATE CLOTH 2 % EX PADS
6.0000 | MEDICATED_PAD | Freq: Once | CUTANEOUS | Status: DC
  Administered 2023-09-10: 6 via TOPICAL

## 2023-09-10 MED ORDER — PANTOPRAZOLE SODIUM 40 MG IV SOLR
40.0000 mg | Freq: Two times a day (BID) | INTRAVENOUS | Status: DC
  Administered 2023-09-11 – 2023-09-17 (×15): 40 mg via INTRAVENOUS
  Filled 2023-09-10 (×15): qty 10

## 2023-09-10 MED ORDER — IPRATROPIUM-ALBUTEROL 0.5-2.5 (3) MG/3ML IN SOLN
3.0000 mL | RESPIRATORY_TRACT | Status: DC | PRN
  Administered 2023-09-12 – 2023-09-13 (×2): 3 mL via RESPIRATORY_TRACT
  Filled 2023-09-10 (×2): qty 3

## 2023-09-10 MED ORDER — PANTOPRAZOLE SODIUM 40 MG IV SOLR
40.0000 mg | Freq: Once | INTRAVENOUS | Status: AC
  Administered 2023-09-10: 40 mg via INTRAVENOUS
  Filled 2023-09-10: qty 10

## 2023-09-10 MED ORDER — PROPOFOL 1000 MG/100ML IV EMUL
INTRAVENOUS | Status: AC
  Administered 2023-09-10: 10 ug/kg/min via INTRAVENOUS
  Filled 2023-09-10: qty 100

## 2023-09-10 MED ORDER — ACETAMINOPHEN 650 MG RE SUPP
650.0000 mg | Freq: Four times a day (QID) | RECTAL | Status: DC | PRN
  Administered 2023-09-13: 650 mg via RECTAL
  Filled 2023-09-10: qty 1

## 2023-09-10 MED ORDER — POLYETHYLENE GLYCOL 3350 17 G PO PACK: 17.0000 g | PACK | Freq: Every day | ORAL | Status: DC

## 2023-09-10 MED ORDER — PROPOFOL 10 MG/ML IV BOLUS
INTRAVENOUS | Status: AC
  Filled 2023-09-10: qty 20

## 2023-09-10 MED ORDER — IPRATROPIUM-ALBUTEROL 0.5-2.5 (3) MG/3ML IN SOLN
3.0000 mL | Freq: Once | RESPIRATORY_TRACT | Status: DC
  Filled 2023-09-10 (×2): qty 3

## 2023-09-10 MED ORDER — ROCURONIUM BROMIDE 100 MG/10ML IV SOLN
INTRAVENOUS | Status: DC | PRN
  Administered 2023-09-10: 100 mg via INTRAVENOUS

## 2023-09-10 MED ORDER — FENTANYL CITRATE PF 50 MCG/ML IJ SOSY
25.0000 ug | PREFILLED_SYRINGE | Freq: Once | INTRAMUSCULAR | Status: AC
  Administered 2023-09-11: 25 ug via INTRAVENOUS

## 2023-09-10 MED ORDER — FENTANYL BOLUS VIA INFUSION: 25.0000 ug | INTRAVENOUS | Status: DC | PRN

## 2023-09-10 MED ORDER — CHLORHEXIDINE GLUCONATE CLOTH 2 % EX PADS
6.0000 | MEDICATED_PAD | Freq: Every day | CUTANEOUS | Status: DC
  Administered 2023-09-10 – 2023-09-17 (×7): 6 via TOPICAL

## 2023-09-10 MED ORDER — POLYETHYLENE GLYCOL 3350 17 G PO PACK: 17.0000 g | PACK | Freq: Every day | ORAL | Status: DC | PRN

## 2023-09-10 MED ORDER — FENTANYL CITRATE (PF) 100 MCG/2ML IJ SOLN
INTRAMUSCULAR | Status: AC
  Filled 2023-09-10: qty 2

## 2023-09-10 MED ORDER — SODIUM CHLORIDE 0.9 % IV SOLN: 25.0000 ug/h | INTRAVENOUS | Status: DC

## 2023-09-10 MED ORDER — IOHEXOL 300 MG/ML  SOLN
100.0000 mL | Freq: Once | INTRAMUSCULAR | Status: AC | PRN
  Administered 2023-09-10: 100 mL via INTRAVENOUS

## 2023-09-10 MED ORDER — REVEFENACIN 175 MCG/3ML IN SOLN
175.0000 ug | Freq: Every day | RESPIRATORY_TRACT | Status: DC
  Filled 2023-09-10: qty 3

## 2023-09-10 MED ORDER — MIDAZOLAM HCL 2 MG/2ML IJ SOLN
INTRAMUSCULAR | Status: AC
  Filled 2023-09-10: qty 2

## 2023-09-10 MED ORDER — FENTANYL 2500MCG IN NS 250ML (10MCG/ML) PREMIX INFUSION
INTRAVENOUS | Status: AC
  Administered 2023-09-10: 25 ug/h via INTRAVENOUS
  Filled 2023-09-10: qty 250

## 2023-09-10 MED ORDER — FENTANYL CITRATE PF 50 MCG/ML IJ SOSY
25.0000 ug | PREFILLED_SYRINGE | Freq: Once | INTRAMUSCULAR | Status: DC
  Administered 2023-09-10: 25 ug via INTRAVENOUS

## 2023-09-10 MED ORDER — PIPERACILLIN-TAZOBACTAM 3.375 G IVPB 30 MIN: 3.3750 g | Freq: Once | INTRAVENOUS | Status: DC

## 2023-09-10 MED ORDER — FAMOTIDINE 20 MG PO TABS: 20.0000 mg | ORAL_TABLET | Freq: Two times a day (BID) | ORAL | Status: DC

## 2023-09-10 MED ORDER — PROPOFOL 1000 MG/100ML IV EMUL: 0.0000 ug/kg/min | INTRAVENOUS | Status: DC

## 2023-09-10 MED ORDER — ARFORMOTEROL TARTRATE 15 MCG/2ML IN NEBU
15.0000 ug | INHALATION_SOLUTION | Freq: Two times a day (BID) | RESPIRATORY_TRACT | Status: DC
  Administered 2023-09-11 – 2023-09-18 (×15): 15 ug via RESPIRATORY_TRACT
  Filled 2023-09-10 (×15): qty 2

## 2023-09-10 MED ORDER — BUPIVACAINE HCL (PF) 0.5 % IJ SOLN
INTRAMUSCULAR | Status: DC | PRN
  Administered 2023-09-10: 30 mL

## 2023-09-10 MED ORDER — MIDAZOLAM HCL 2 MG/2ML IJ SOLN: 1.0000 mg | INTRAMUSCULAR | Status: DC | PRN

## 2023-09-10 MED ORDER — BUDESONIDE 0.5 MG/2ML IN SUSP
0.5000 mg | Freq: Two times a day (BID) | RESPIRATORY_TRACT | Status: DC
  Administered 2023-09-11 – 2023-09-18 (×15): 0.5 mg via RESPIRATORY_TRACT
  Filled 2023-09-10 (×16): qty 2

## 2023-09-10 MED ORDER — SODIUM CHLORIDE 0.9 % IV SOLN
2.0000 g | Freq: Three times a day (TID) | INTRAVENOUS | Status: DC
  Administered 2023-09-10 – 2023-09-11 (×2): 2 g via INTRAVENOUS
  Filled 2023-09-10: qty 12.5

## 2023-09-10 MED ORDER — SODIUM CHLORIDE 0.9 % IV SOLN
INTRAVENOUS | Status: AC
  Filled 2023-09-10: qty 2

## 2023-09-10 MED ORDER — REVEFENACIN 175 MCG/3ML IN SOLN
175.0000 ug | Freq: Every day | RESPIRATORY_TRACT | Status: DC
  Administered 2023-09-11 – 2023-09-18 (×8): 175 ug via RESPIRATORY_TRACT
  Filled 2023-09-10 (×9): qty 3

## 2023-09-10 MED ORDER — DOCUSATE SODIUM 100 MG PO CAPS: 100.0000 mg | ORAL_CAPSULE | Freq: Two times a day (BID) | ORAL | Status: DC | PRN

## 2023-09-10 MED ORDER — LACTATED RINGERS IV SOLN: INTRAVENOUS | Status: DC

## 2023-09-10 MED ORDER — SODIUM CHLORIDE 0.9 % IV SOLN
2.0000 g | Freq: Once | INTRAVENOUS | Status: DC
  Filled 2023-09-10: qty 12.5

## 2023-09-10 MED ORDER — FENTANYL CITRATE PF 50 MCG/ML IJ SOSY
25.0000 ug | PREFILLED_SYRINGE | Freq: Once | INTRAMUSCULAR | Status: DC
  Filled 2023-09-10: qty 1

## 2023-09-10 MED ORDER — SODIUM CHLORIDE 0.9 % IR SOLN
Status: DC | PRN
  Administered 2023-09-10 (×3): 1000 mL

## 2023-09-10 MED ORDER — FENTANYL CITRATE (PF) 100 MCG/2ML IJ SOLN
INTRAMUSCULAR | Status: DC | PRN
  Administered 2023-09-10: 100 ug via INTRAVENOUS

## 2023-09-10 MED ORDER — SODIUM CHLORIDE 0.9 % IV SOLN
500.0000 mg | INTRAVENOUS | Status: DC
  Administered 2023-09-11: 500 mg via INTRAVENOUS
  Filled 2023-09-10: qty 5

## 2023-09-10 MED ORDER — PHENYLEPHRINE HCL (PRESSORS) 10 MG/ML IV SOLN
INTRAVENOUS | Status: DC | PRN
Start: 2023-09-10 — End: 2023-09-10
  Administered 2023-09-10 (×2): 160 ug via INTRAVENOUS

## 2023-09-10 MED ORDER — LACTATED RINGERS IV BOLUS
500.0000 mL | Freq: Once | INTRAVENOUS | Status: AC
  Administered 2023-09-10: 500 mL via INTRAVENOUS

## 2023-09-10 MED ORDER — FENTANYL 2500MCG IN NS 250ML (10MCG/ML) PREMIX INFUSION: 25.0000 ug/h | INTRAVENOUS | Status: DC

## 2023-09-10 SURGICAL SUPPLY — 67 items
APL PRP STRL LF DISP 70% ISPRP (MISCELLANEOUS) ×1
APPLIER CLIP 11 MED OPEN (CLIP)
APPLIER CLIP 13 LRG OPEN (CLIP)
APR CLP LRG 13 20 CLIP (CLIP)
APR CLP MED 11 20 MLT OPN (CLIP)
BARRIER SKIN 2 3/4 (OSTOMY) IMPLANT
BARRIER SKIN OD2.25 2 3/4 FLNG (OSTOMY) IMPLANT
BRR SKN FLT 2.75X2.25 2 PC (OSTOMY)
CHLORAPREP W/TINT 26 (MISCELLANEOUS) ×2 IMPLANT
CLAMP POUCH DRAINAGE QUIET (OSTOMY) IMPLANT
CLIP APPLIE 11 MED OPEN (CLIP) IMPLANT
CLIP APPLIE 13 LRG OPEN (CLIP) IMPLANT
CLOTH BEACON ORANGE TIMEOUT ST (SAFETY) ×2 IMPLANT
COVER LIGHT HANDLE STERIS (MISCELLANEOUS) ×4 IMPLANT
DRAPE WARM FLUID 44X44 (DRAPES) ×2 IMPLANT
DRSG OPSITE POSTOP 4X10 (GAUZE/BANDAGES/DRESSINGS) IMPLANT
DRSG OPSITE POSTOP 4X8 (GAUZE/BANDAGES/DRESSINGS) IMPLANT
ELECT BLADE 6 FLAT ULTRCLN (ELECTRODE) IMPLANT
ELECT REM PT RETURN 9FT ADLT (ELECTROSURGICAL) ×1
ELECTRODE REM PT RTRN 9FT ADLT (ELECTROSURGICAL) ×2 IMPLANT
GLOVE BIO SURGEON STRL SZ 6.5 (GLOVE) ×2 IMPLANT
GLOVE BIOGEL PI IND STRL 6.5 (GLOVE) ×2 IMPLANT
GLOVE BIOGEL PI IND STRL 7.0 (GLOVE) ×4 IMPLANT
GOWN STRL REUS W/TWL LRG LVL3 (GOWN DISPOSABLE) ×6 IMPLANT
HANDLE SUCTION POOLE (INSTRUMENTS) ×2 IMPLANT
INST SET MAJOR GENERAL (KITS) ×2 IMPLANT
KIT REMOVER STAPLE SKIN (MISCELLANEOUS) IMPLANT
KIT TURNOVER KIT A (KITS) ×2 IMPLANT
LIGASURE IMPACT 36 18CM CVD LR (INSTRUMENTS) IMPLANT
MANIFOLD NEPTUNE II (INSTRUMENTS) ×2 IMPLANT
NDL HYPO 18GX1.5 BLUNT FILL (NEEDLE) ×2 IMPLANT
NDL HYPO 21X1.5 SAFETY (NEEDLE) ×2 IMPLANT
NEEDLE HYPO 18GX1.5 BLUNT FILL (NEEDLE) ×1 IMPLANT
NEEDLE HYPO 21X1.5 SAFETY (NEEDLE) ×1 IMPLANT
NS IRRIG 1000ML POUR BTL (IV SOLUTION) ×4 IMPLANT
PACK MAJOR ABDOMINAL (CUSTOM PROCEDURE TRAY) ×2 IMPLANT
PAD ARMBOARD 7.5X6 YLW CONV (MISCELLANEOUS) ×2 IMPLANT
PENCIL SMOKE EVACUATOR COATED (MISCELLANEOUS) ×2 IMPLANT
POSITIONER HEAD 8X9X4 ADT (SOFTGOODS) ×2 IMPLANT
POUCH OSTOMY 2 3/4 H 3804 (WOUND CARE)
POUCH OSTOMY 2 PC DRNBL 2 3/4 (WOUND CARE)
POUCH OSTOMY 2 PC DRNBL 2.75 (WOUND CARE) IMPLANT
RELOAD LINEAR CUT PROX 55 BLUE (ENDOMECHANICALS) IMPLANT
RELOAD PROXIMATE 75MM BLUE (ENDOMECHANICALS) IMPLANT
RELOAD STAPLE 55 3.8 BLU REG (ENDOMECHANICALS) IMPLANT
RELOAD STAPLE 75 3.8 BLU REG (ENDOMECHANICALS) IMPLANT
RETRACTOR WND ALEXIS-O 25 LRG (MISCELLANEOUS) IMPLANT
RETRACTOR WOUND ALXS 18CM MED (MISCELLANEOUS) IMPLANT
RTRCTR WOUND ALEXIS O 18CM MED (MISCELLANEOUS)
RTRCTR WOUND ALEXIS O 25CM LRG (MISCELLANEOUS)
SET BASIN LINEN APH (SET/KITS/TRAYS/PACK) ×2 IMPLANT
SPONGE DRAIN TRACH 4X4 STRL 2S (GAUZE/BANDAGES/DRESSINGS) IMPLANT
SPONGE T-LAP 18X18 ~~LOC~~+RFID (SPONGE) ×2 IMPLANT
STAPLER GUN LINEAR PROX 60 (STAPLE) IMPLANT
STAPLER PROXIMATE 55 BLUE (STAPLE) IMPLANT
STAPLER PROXIMATE 75MM BLUE (STAPLE) IMPLANT
STAPLER VISISTAT (STAPLE) ×2 IMPLANT
SUCTION POOLE HANDLE (INSTRUMENTS) ×1
SUT CHROMIC 0 SH (SUTURE) IMPLANT
SUT CHROMIC 2 0 SH (SUTURE) IMPLANT
SUT CHROMIC 3 0 SH 27 (SUTURE) IMPLANT
SUT PDS AB CT VIOLET #0 27IN (SUTURE) ×4 IMPLANT
SUT PROLENE 2 0 SH 30 (SUTURE) IMPLANT
SUT SILK 3 0 SH CR/8 (SUTURE) ×2 IMPLANT
SYR 30ML LL (SYRINGE) ×4 IMPLANT
TAPE HYPAFIX 6X30 (GAUZE/BANDAGES/DRESSINGS) IMPLANT
TRAY FOLEY MTR SLVR 16FR STAT (SET/KITS/TRAYS/PACK) ×2 IMPLANT

## 2023-09-10 NOTE — Consult Note (Signed)
Pharmacy Antibiotic Note  Corey Griffin is a 77 y.o. male admitted on 09/10/2023 with  intra-abdominal infection . PMH significant for COPD, colon cancer. CT abdomen revealed possible perforated gastric or duodenal ulcer and possible acute cholecystitis. Pharmacy has been consulted for cefepime dosing.  Plan: Day 1 of antibiotics Start cefepime 2 g IV Q8H Continue to monitor renal function and follow culture results   Height: 5\' 9"  (175.3 cm) Weight: 68 kg (150 lb) IBW/kg (Calculated) : 70.7  Temp (24hrs), Avg:97.6 F (36.4 C), Min:97.6 F (36.4 C), Max:97.6 F (36.4 C)  Recent Labs  Lab 09/10/23 1640  WBC 16.1*  CREATININE 0.81    Estimated Creatinine Clearance: 72.3 mL/min (by C-G formula based on SCr of 0.81 mg/dL).    No Known Allergies  Antimicrobials this admission: 10/13 Metronidazole x1 10/13 Cefepime >>  Dose adjustments this admission: N/A  Microbiology results: 10/13 BCx: IP  Thank you for allowing pharmacy to be a part of this patient's care.  Celene Squibb, PharmD Clinical Pharmacist 09/10/2023 6:41 PM

## 2023-09-10 NOTE — Progress Notes (Signed)
eLink Physician-Brief Progress Note Patient Name: Corey Griffin DOB: 1946-06-06 MRN: [id number]   Date of Service  09/10/2023  HPI/Events of Note  21 male admitted earlier for perf bowel, now back in ICU post ex lap, gastric ulcer perf-omental patch  on Ventilator.   Camera: Discussed with RN. Ordered labs, fenta, propofol drip sedation.  560( Vt < 8 ml/ibw)/5/40%.  MAP > 65. Sats 97%   Data reviewed 7.40/52/58/33  Hg stable at 11.9 CxR reviewed: ET in place. Left upper zone chronic interstitial thickening, atelectasis. Left > right.  KUB: OG/NG in place.    eICU Interventions  - keep MAP > 65 - SCD ordered.  - CBG goals < 180, SSI - nebs      Intervention Category Major Interventions: Other: Evaluation Type: New Patient Evaluation  Ranee Gosselin 09/10/2023, 11:54 PM  00:38   Discussed with RN BP soft 83/67 (74), sedation minimal Fent 25, Prop 5. - LR bolus once. Keep MAP > 65, Hg still > 11. Also needs bil soft wrist restrains to avoid self extubation-ordered -to go for US guided PIV for low dose levophed, avoid extravasation, per protocol.   03:10 OGT had to be advanced, KUB done. BSRN is it ok to use or LIWS KUB/CxR reviewed, OG in place.  Ok to use it for [employer].

## 2023-09-10 NOTE — Progress Notes (Signed)
Elink following for sepsis protocol. 

## 2023-09-10 NOTE — Anesthesia Procedure Notes (Signed)
Procedure Name: Intubation Date/Time: 09/10/2023 8:51 PM  Performed by: Windell Norfolk, MDPre-anesthesia Checklist: Patient identified, Emergency Drugs available, Suction available, Patient being monitored and Timeout performed Patient Re-evaluated:Patient Re-evaluated prior to induction Oxygen Delivery Method: Circle system utilized Preoxygenation: Pre-oxygenation with 100% oxygen Induction Type: IV induction Laryngoscope Size: Glidescope and 3 Grade View: Grade I Tube type: Oral Tube size: 8.0 mm Number of attempts: 1 Airway Equipment and Method: Video-laryngoscopy and Stylet Placement Confirmation: ETT inserted through vocal cords under direct vision, positive ETCO2 and breath sounds checked- equal and bilateral Secured at: 22 cm Tube secured with: Tape Dental Injury: Teeth and Oropharynx as per pre-operative assessment

## 2023-09-10 NOTE — H&P (Signed)
[facility] History and Physical  Reason for Referral: Free Air/ Perforated ulcer  Referring Physician: Carmel Sacramento, PA   Chief Complaint   bowe blockage     Corey Griffin is a 77 y.o. male.  HPI: Corey Griffin is a 48 who has a history of COPD on home O2, chronic constipation on linzess who came to the ED today with distention and reported 10/10 pain. He has been distended and had pain for several days and has not been able to eat. He came to the Ed thinking he had a blockage.  He was seen by surgery in the past for "thinning abdominal wall" and was told the muscle on the right was atrophic .    He was worked up in the Ed and Ct shows free air and thickening of the duodenum and stomach concerning for perforated ulcer. He is tachycardic and getting tachypneic.   He denies NSAID use or recent prednisone. He does use tylenol.  He does not walk and live at home with his grandson Corey Griffin. He does not have Corey Griffin's number.   Past Medical History:  Diagnosis Date   COPD (chronic obstructive pulmonary disease) (HCC)     Past Surgical History:  Procedure Laterality Date   COLONOSCOPY WITH PROPOFOL N/A 09/04/2018   Procedure: COLONOSCOPY WITH PROPOFOL;  Surgeon: Benancio Deeds, MD;  Location: [facility];  Service: Gastroenterology;  Laterality: N/A;   HAND SURGERY     POLYPECTOMY  09/04/2018   Procedure: POLYPECTOMY;  Surgeon: Benancio Deeds, MD;  Location: [facility];  Service: Gastroenterology;;    History reviewed. No pertinent family history.  Social History   Tobacco Use   Smoking status: Every Day    Current packs/day: 1.00    Average packs/day: 1 pack/day for 50.0 years (50.0 ttl pk-yrs)    Types: Cigarettes   Smokeless tobacco: Never  Substance Use Topics   Alcohol use: Yes   Drug use: No    Medications: I have reviewed the patient's current medications. Current Facility-Administered Medications  Medication Dose Route Frequency Provider  Last Rate Last Admin   arformoterol (BROVANA) nebulizer solution 15 mcg  15 mcg Nebulization Q12H Pia Mau D, PA-C       budesonide (PULMICORT) nebulizer solution 0.5 mg  0.5 mg Nebulization BID Pia Mau D, PA-C       ceFEPIme (MAXIPIME) 2 g in sodium chloride 0.9 % 100 mL IVPB  2 g Intravenous Q8H Coulter, Eber Jones, RPH   Stopped at 09/10/23 1939   [START ON 09/11/2023] cefoTEtan (CEFOTAN) 2 g in sodium chloride 0.9 % 100 mL IVPB  2 g Intravenous On Call to OR Lucretia Roers, MD       Chlorhexidine Gluconate Cloth 2 % PADS 6 each  6 each Topical Once Lucretia Roers, MD       fentaNYL (SUBLIMAZE) injection 25 mcg  25 mcg Intravenous Once Beatty, Celeste A, PA-C       ipratropium-albuterol (DUONEB) 0.5-2.5 (3) MG/3ML nebulizer solution 3 mL  3 mL Nebulization Q4H PRN Lidia Collum, PA-C       ipratropium-albuterol (DUONEB) 0.5-2.5 (3) MG/3ML nebulizer solution 3 mL  3 mL Nebulization Once Pia Mau D, PA-C       lactated ringers infusion   Intravenous Continuous Carmel Sacramento A, PA-C 150 mL/hr at 09/10/23 1846 New Bag at 09/10/23 1846   [START ON 09/11/2023] revefenacin (YUPELRI) nebulizer solution 175 mcg  175 mcg Nebulization Daily Cathren Laine, MD  Current Outpatient Medications  Medication Sig Dispense Refill Last Dose   albuterol (PROVENTIL HFA;VENTOLIN HFA) 108 (90 Base) MCG/ACT inhaler Inhale 1-2 puffs into the lungs every 6 (six) hours as needed for wheezing or shortness of breath. 1 Inhaler 0    albuterol (PROVENTIL) (2.5 MG/3ML) 0.083% nebulizer solution Take 3 mLs (2.5 mg total) by nebulization every 4 (four) hours as needed for wheezing or shortness of breath. 75 mL 1    budesonide-formoterol (SYMBICORT) 160-4.5 MCG/ACT inhaler Inhale 2 puffs into the lungs 2 (two) times daily. 1 each 6    cefdinir (OMNICEF) 300 MG capsule Take 1 capsule (300 mg total) by mouth 2 (two) times daily. 20 capsule 0    furosemide (LASIX) 40 MG tablet Take 1 tablet (40 mg total) by mouth  daily for 90 doses. 90 tablet 0    ipratropium (ATROVENT HFA) 17 MCG/ACT inhaler Inhale 2 puffs into the lungs every 6 (six) hours. 1 Inhaler 2    mometasone (NASONEX) 50 MCG/ACT nasal spray Place 1 spray into the nose 2 (two) times daily.       oxycodone (OXY-IR) 5 MG capsule Take 5 mg by mouth every 6 (six) hours as needed for pain.  (Patient not taking: Reported on 02/07/2022)      oxymetazoline (AFRIN) 0.05 % nasal spray Place 1 spray into both nostrils daily as needed for congestion. (Patient not taking: Reported on 02/07/2022)      tamsulosin (FLOMAX) 0.4 MG CAPS capsule Take 0.4 mg by mouth daily.       No Known Allergies    ROS:  A comprehensive review of systems was negative except for: Respiratory: positive for COPD Gastrointestinal: positive for abdominal pain and constipation  Blood pressure 102/69, pulse (!) 137, temperature 97.6 F (36.4 C), temperature source Axillary, resp. rate (!) 25, height 5\' 9"  (1.753 m), weight 68 kg, SpO2 97%. Physical Exam HENT:     Head: Normocephalic.     Nose: Nose normal.  Eyes:     Extraocular Movements: Extraocular movements intact.  Cardiovascular:     Rate and Rhythm: Tachycardia present.  Pulmonary:     Effort: Tachypnea present.  Abdominal:     General: There is distension.     Palpations: Abdomen is soft.     Tenderness: There is abdominal tenderness.     Comments: Almost loss of domain like appearance of abdomen, weakening toward the right   Musculoskeletal:        General: No swelling.  Skin:    General: Skin is warm.  Neurological:     General: No focal deficit present.     Mental Status: He is alert.  Psychiatric:        Mood and Affect: Mood normal.        Behavior: Behavior normal.     Results: Results for orders placed or performed during the hospital encounter of 09/10/23 (from the past 48 hour(s))  CBC with Differential     Status: Abnormal   Collection Time: 09/10/23  4:40 PM  Result Value Ref Griffin   WBC  16.1 (H) 4.0 - 10.5 K/uL   RBC 3.92 (L) 4.22 - 5.81 MIL/uL   Hemoglobin 12.2 (L) 13.0 - 17.0 g/dL   HCT 16.1 09.6 - 04.5 %   MCV 100.3 (H) 80.0 - 100.0 fL   MCH 31.1 26.0 - 34.0 pg   MCHC 31.0 30.0 - 36.0 g/dL   RDW 40.9 (H) 81.1 - 91.4 %   Platelets 617 (H)  150 - 400 K/uL   nRBC 0.0 0.0 - 0.2 %   Neutrophils Relative % 88 %   Neutro Abs 14.0 (H) 1.7 - 7.7 K/uL   Lymphocytes Relative 3 %   Lymphs Abs 0.5 (L) 0.7 - 4.0 K/uL   Monocytes Relative 7 %   Monocytes Absolute 1.2 (H) 0.1 - 1.0 K/uL   Eosinophils Relative 1 %   Eosinophils Absolute 0.1 0.0 - 0.5 K/uL   Basophils Relative 0 %   Basophils Absolute 0.1 0.0 - 0.1 K/uL   Immature Granulocytes 1 %   Abs Immature Granulocytes 0.20 (H) 0.00 - 0.07 K/uL    Comment: Performed at [facility], [address], [place], [place] [address]  Comprehensive metabolic panel     Status: Abnormal   Collection Time: 09/10/23  4:40 PM  Result Value Ref Griffin   Sodium 137 135 - 145 mmol/L   Potassium 5.0 3.5 - 5.1 mmol/L   Chloride 92 (L) 98 - 111 mmol/L   CO2 32 22 - 32 mmol/L   Glucose, Bld 108 (H) 70 - 99 mg/dL    Comment: Glucose reference Griffin applies only to samples taken after fasting for at least 8 hours.   BUN 39 (H) 8 - 23 mg/dL   Creatinine, Ser 1.91 0.61 - 1.24 mg/dL   Calcium 9.3 8.9 - 47.8 mg/dL   Total Protein 8.0 6.5 - 8.1 g/dL   Albumin 2.8 (L) 3.5 - 5.0 g/dL   AST 22 15 - 41 U/L   ALT 18 0 - 44 U/L   Alkaline Phosphatase 185 (H) 38 - 126 U/L   Total Bilirubin 0.9 0.3 - 1.2 mg/dL   GFR, Estimated >29 >56 mL/min    Comment: (NOTE) Calculated using the CKD-EPI Creatinine Equation (2021)    Anion gap 13 5 - 15    Comment: Performed at [facility], [address] [place], [place] [address]  Lipase, blood     Status: None   Collection Time: 09/10/23  4:40 PM  Result Value Ref Griffin   Lipase 20 11 - 51 U/L    Comment: Performed at [facility], [address] [place], [place] [address]  Urinalysis, Routine w reflex  microscopic -Urine, Clean Catch     Status: Abnormal   Collection Time: 09/10/23  5:30 PM  Result Value Ref Griffin   Color, Urine YELLOW YELLOW   APPearance CLEAR CLEAR   Specific Gravity, Urine 1.019 1.005 - 1.030   pH 5.0 5.0 - 8.0   Glucose, UA NEGATIVE NEGATIVE mg/dL   Hgb urine dipstick SMALL (A) NEGATIVE   Bilirubin Urine NEGATIVE NEGATIVE   Ketones, ur NEGATIVE NEGATIVE mg/dL   Protein, ur NEGATIVE NEGATIVE mg/dL   Nitrite NEGATIVE NEGATIVE   Leukocytes,Ua NEGATIVE NEGATIVE   RBC / HPF 21-50 0 - 5 RBC/hpf   WBC, UA 0-5 0 - 5 WBC/hpf   Bacteria, UA NONE SEEN NONE SEEN   Squamous Epithelial / HPF 0-5 0 - 5 /HPF   Hyaline Casts, UA PRESENT     Comment: Performed at [facility], [address], [place], [place] [address]  Lactic acid, plasma     Status: None   Collection Time: 09/10/23  6:09 PM  Result Value Ref Griffin   Lactic Acid, Venous 1.2 0.5 - 1.9 mmol/L    Comment: Performed at [facility], [address], [place], [place] [address]  Blood culture (routine x 2)     Status: None (Preliminary result)  Collection Time: 09/10/23  6:09 PM   Specimen: Left Antecubital; Blood  Result Value Ref Griffin   Specimen Description LEFT ANTECUBITAL BLOOD    Special Requests      BOTTLES DRAWN AEROBIC AND ANAEROBIC Blood Culture results may not be optimal due to an excessive volume of blood received in culture bottles Performed at [facility], [address], [place], [place] [address]    Culture PENDING    Report Status PENDING   Blood culture (routine x 2)     Status: None (Preliminary result)   Collection Time: 09/10/23  6:22 PM   Specimen: BLOOD LEFT HAND  Result Value Ref Griffin   Specimen Description BLOOD LEFT HAND BLOOD    Special Requests      BOTTLES DRAWN AEROBIC AND ANAEROBIC Blood Culture results may not be optimal due to an excessive volume of blood received in culture bottles Performed at [facility], [address], [place], [place] [address]    Culture PENDING     Report Status PENDING   Type and screen [facility]     Status: None   Collection Time: 09/10/23  6:43 PM  Result Value Ref Griffin   ABO/RH(D) O NEG    Antibody Screen NEG    Sample Expiration      09/13/2023,2359 Performed at [facility], [address] [place], [place] [address]    Personally reviewed  and showed patient - free air and fluid around the area of the stomach/ duodenum  CT ABDOMEN PELVIS W CONTRAST  Result Date: 09/10/2023 CLINICAL DATA:  Acute abdominal pain EXAM: CT ABDOMEN AND PELVIS WITH CONTRAST TECHNIQUE: Multidetector CT imaging of the abdomen and pelvis was performed using the standard protocol following bolus administration of intravenous contrast. RADIATION DOSE REDUCTION: This exam was performed according to the departmental dose-optimization program which includes automated exposure control, adjustment of the mA and/or kV according to patient size and/or use of iterative reconstruction technique. CONTRAST:  OMNIPAQUE IOHEXOL 300 MG/ML  SOLN COMPARISON:  CT abdomen and pelvis 05/23/2018. MRI lumbar spine 11/11/2020. FINDINGS: Lower chest: No acute abnormality. Hepatobiliary: Gallstones are present. There is mild stranding and fluid surrounding the gallbladder. There is no biliary ductal dilatation. The liver is within normal limits. Pancreas: Unremarkable. No pancreatic ductal dilatation or surrounding inflammatory changes. Spleen: Normal in size without focal abnormality. Adrenals/Urinary Tract: The bladder is decompressed by Foley catheter. There is no hydronephrosis or perinephric fluid. There is a 1.4 x 1.0 by 1.8 cm calculus in the superior pole of the left kidney. There is a cyst in the inferior left kidney measuring 18 mm. Adrenal glands are within normal limits. Stomach/Bowel: There is some wall thickening of the duodenal the gastric antrum with a small amount of surrounding free air. There is a additional minimal free air in the right upper quadrant.  The stomach is nondilated. There is no evidence for bowel obstruction. The cecum is high riding. The appendix is within normal limits. Vascular/Lymphatic: Aortic atherosclerosis. No enlarged abdominal or pelvic lymph nodes. Reproductive: Prostate gland is enlarged. Other: There is a large left inguinal hernia containing nondilated sigmoid colon. There is a small amount of free fluid in the right upper quadrant. Musculoskeletal: The bones are diffusely osteopenic. There is a new compression fracture of the superior endplate of T12, mild, which appears acute or subacute. L4 and L2 compression deformities are stable and chronic. IMPRESSION: 1. Wall thickening of the duodenum and gastric antrum with surrounding inflammation and free air  worrisome for perforated gastric or duodenal ulcer. 2. Cholelithiasis with mild stranding and fluid surrounding the gallbladder. Findings are worrisome for acute cholecystitis. Please correlate clinically. 3. Large left inguinal hernia containing nondilated sigmoid colon. 4. Nonobstructing left renal calculus. 5. Acute or subacute compression fracture of the superior endplate of T12. 6. Left Bosniak I benign renal cyst measuring 1.8 cm. No follow-up imaging is recommended. JACR 2018 Feb; 264-273, Management of the Incidental Renal Mass on CT, [employer] 2021; [id number], Bosniak Classification of Cystic Renal Masses, Version 2019. Aortic Atherosclerosis (ICD10-I70.0). Electronically Signed   By: Darliss Cheney M.D.   On: 09/10/2023 18:09   DG Chest Portable 1 View  Result Date: 09/10/2023 CLINICAL DATA:  Shortness of breath EXAM: PORTABLE CHEST 1 VIEW COMPARISON:  Chest x-ray 04/02/2021. FINDINGS: There are new patchy airspace opacities in the left upper lobe. 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 There some scattered interstitial opacities in both lungs which may be related to mild edema or chronic changes. The costophrenic angles are clear. There is no pneumothorax or acute  fracture. The cardiomediastinal silhouette is within normal limits. No acute fractures are seen. IMPRESSION: 1. New patchy airspace opacities in the left upper lobe, concerning for pneumonia. Follow-up PA and lateral chest x-ray in 4-6 weeks recommended to confirm complete resolution and to exclude underlying pathology. 2. COPD. Electronically Signed   By: Darliss Cheney M.D.   On: 09/10/2023 17:42     Assessment & Plan:  Corey Griffin is a 77 y.o. male with free air and what looks like perforated ulcer.   Discussed with Ed team. Given history, COPD, home use, I expect he will stay intubated post op. No critical care at [facility] and will need transfer after. They are getting in touch with Critical Care, which is much appreciated.   Discussed with patient surgery, risk of bleeding, infection, need for drain placement, issues with hernia due to his abdominal wall, staying intubated, ICU care, transfer. Discussed normal post operative course of NPO and not eating until we prove ulcer is sealed up.   Tried to call his daughter, his friend DR, and Lexi- his grandson's girlfriend to let them know what is going on and about surgery, transfer after surgery, intubation, but they did not answer after multiple attempts.   All questions were answered to the satisfaction of the patient.   Lucretia Roers 09/10/2023, 8:16 PM

## 2023-09-10 NOTE — Progress Notes (Signed)
Call from care link who are on  way to pick up patient. PRN Sedation ordes given    SIGNATURE    Dr. Kalman Shan, M.D., F.C.C.P,  Pulmonary and Critical Care Medicine Staff Physician, [facility] Center Director - Interstitial Lung Disease  Program  Pulmonary Fibrosis [facility] at [facility] [place], [place], [address]   Pager: [phone], If no answer  -> Check AMION or Try [phone] Telephone (clinical office): [phone] Telephone (research): [phone]  10:01 PM 09/10/2023

## 2023-09-10 NOTE — ED Provider Notes (Signed)
[place] EMERGENCY DEPARTMENT AT [facility] Provider Note   CSN: [id number] Arrival date & time: 09/10/23  1533     History  Chief Complaint  Patient presents with   bowe blockage    Corey Griffin is a 77 y.o. male.  PMH of COPD on chronic oxygen, chronic constipation and thinning of the abdominal wall with abdominal distention.  Comes in the ER today for abdominal pain and worsening distention of the past several days no vomiting but states he has not been able to eat and has not been drinking small sips of water just do not keep his mouth wet due to his discomfort.  He takes Linzess and over-the-counter laxatives at baseline for bowel movements but states he feels like he may have a blockage.  Has something similar about 5 years ago had colonoscopy at that time.  Was also seen by surgery and told he does not have a hernia but has severe thinning of the abdominal wall.  HPI     Home Medications Prior to Admission medications   Medication Sig Start Date End Date Taking? Authorizing Provider  albuterol (PROVENTIL HFA;VENTOLIN HFA) 108 (90 Base) MCG/ACT inhaler Inhale 1-2 puffs into the lungs every 6 (six) hours as needed for wheezing or shortness of breath. 01/01/17   Blane Ohara, MD  albuterol (PROVENTIL) (2.5 MG/3ML) 0.083% nebulizer solution Take 3 mLs (2.5 mg total) by nebulization every 4 (four) hours as needed for wheezing or shortness of breath. 01/01/17   Blane Ohara, MD  budesonide-formoterol [facility]) 160-4.5 MCG/ACT inhaler Inhale 2 puffs into the lungs 2 (two) times daily. 05/23/22   Nyoka Cowden, MD  cefdinir (OMNICEF) 300 MG capsule Take 1 capsule (300 mg total) by mouth 2 (two) times daily. 03/24/23   Nyoka Cowden, MD  furosemide (LASIX) 40 MG tablet Take 1 tablet (40 mg total) by mouth daily for 90 doses. 08/16/22 11/14/22  Nyoka Cowden, MD  ipratropium (ATROVENT HFA) 17 MCG/ACT inhaler Inhale 2 puffs into the lungs every 6 (six) hours. 01/01/17    Blane Ohara, MD  mometasone (NASONEX) 50 MCG/ACT nasal spray Place 1 spray into the nose 2 (two) times daily.     [provider]  oxycodone (OXY-IR) 5 MG capsule Take 5 mg by mouth every 6 (six) hours as needed for pain.  Patient not taking: Reported on 02/07/2022    [provider]  oxymetazoline (AFRIN) 0.05 % nasal spray Place 1 spray into both nostrils daily as needed for congestion. Patient not taking: Reported on 02/07/2022    [provider]  tamsulosin (FLOMAX) 0.4 MG CAPS capsule Take 0.4 mg by mouth daily.    [provider]      Allergies    Patient has no known allergies.    Review of Systems   Review of Systems  Physical Exam Updated Vital Signs BP (!) 119/91   Pulse (!) 119   Temp 97.6 F (36.4 C) (Axillary)   Resp (!) 22   Ht 5\' 9"  (1.753 m)   Wt 68 kg   SpO2 (!) 86%   BMI 22.15 kg/m  Physical Exam Vitals and nursing note reviewed.  Constitutional:      General: He is not in acute distress.    Appearance: He is well-developed.  HENT:     Head: Normocephalic and atraumatic.  Eyes:     Conjunctiva/sclera: Conjunctivae normal.  Cardiovascular:     Rate and Rhythm: Regular rhythm. Tachycardia present.  Heart sounds: No murmur heard. Pulmonary:     Breath sounds: Normal breath sounds.     Comments: Has not increased work of breathing, speaking about 4-5 word sentences.  No wheezing rales or rhonchi Abdominal:     General: There is distension.     Palpations: Abdomen is soft.     Tenderness: There is abdominal tenderness. There is no guarding or rebound.     Comments: Diffuse tenderness of abdomen with no rebound guarding rigidity  Musculoskeletal:        General: No swelling.     Cervical back: Neck supple.  Skin:    General: Skin is warm and dry.     Capillary Refill: Capillary refill takes less than 2 seconds.  Neurological:     General: No focal deficit present.     Mental Status: He is alert and oriented to  person, place, and time.  Psychiatric:        Mood and Affect: Mood normal.     ED Results / Procedures / Treatments   Labs (all labs ordered are listed, but only abnormal results are displayed) Labs Reviewed  CBC WITH DIFFERENTIAL/PLATELET - Abnormal; Notable for the following components:      Result Value   WBC 16.1 (*)    RBC 3.92 (*)    Hemoglobin 12.2 (*)    MCV 100.3 (*)    RDW 15.8 (*)    Platelets 617 (*)    Neutro Abs 14.0 (*)    Lymphs Abs 0.5 (*)    Monocytes Absolute 1.2 (*)    Abs Immature Granulocytes 0.20 (*)    All other components within normal limits  COMPREHENSIVE METABOLIC PANEL - Abnormal; Notable for the following components:   Chloride 92 (*)    Glucose, Bld 108 (*)    BUN 39 (*)    Albumin 2.8 (*)    Alkaline Phosphatase 185 (*)    All other components within normal limits  URINALYSIS, ROUTINE W REFLEX MICROSCOPIC - Abnormal; Notable for the following components:   Hgb urine dipstick SMALL (*)    All other components within normal limits  CULTURE, BLOOD (ROUTINE X 2)  CULTURE, BLOOD (ROUTINE X 2)  LIPASE, BLOOD  LACTIC ACID, PLASMA  LACTIC ACID, PLASMA  TYPE AND SCREEN    EKG EKG Interpretation Date/Time:  Sunday September 10 2023 17:04:05 EDT Ventricular Rate:  113 PR Interval:  36 QRS Duration:  104 QT Interval:  346 QTC Calculation: 468 R Axis:   -71  Text Interpretation: Sinus tachycardia Ventricular premature complex Left anterior fasicular block Non-specific ST-t changes No significant change since last tracing Artifact Confirmed by Cathren Laine ([id number]) on 09/10/2023 5:17:48 PM  Radiology CT ABDOMEN PELVIS W CONTRAST  Result Date: 09/10/2023 CLINICAL DATA:  Acute abdominal pain EXAM: CT ABDOMEN AND PELVIS WITH CONTRAST TECHNIQUE: Multidetector CT imaging of the abdomen and pelvis was performed using the standard protocol following bolus administration of intravenous contrast. RADIATION DOSE REDUCTION: This exam was performed  according to the departmental dose-optimization program which includes automated exposure control, adjustment of the mA and/or kV according to patient size and/or use of iterative reconstruction technique. CONTRAST:  OMNIPAQUE IOHEXOL 300 MG/ML  SOLN COMPARISON:  CT abdomen and pelvis 05/23/2018. MRI lumbar spine 11/11/2020. FINDINGS: Lower chest: No acute abnormality. Hepatobiliary: Gallstones are present. There is mild stranding and fluid surrounding the gallbladder. There is no biliary ductal dilatation. The liver is within normal limits. Pancreas: Unremarkable. No pancreatic ductal dilatation or  surrounding inflammatory changes. Spleen: Normal in size without focal abnormality. Adrenals/Urinary Tract: The bladder is decompressed by Foley catheter. There is no hydronephrosis or perinephric fluid. There is a 1.4 x 1.0 by 1.8 cm calculus in the superior pole of the left kidney. There is a cyst in the inferior left kidney measuring 18 mm. Adrenal glands are within normal limits. Stomach/Bowel: There is some wall thickening of the duodenal the gastric antrum with a small amount of surrounding free air. There is a additional minimal free air in the right upper quadrant. The stomach is nondilated. There is no evidence for bowel obstruction. The cecum is high riding. The appendix is within normal limits. Vascular/Lymphatic: Aortic atherosclerosis. No enlarged abdominal or pelvic lymph nodes. Reproductive: Prostate gland is enlarged. Other: There is a large left inguinal hernia containing nondilated sigmoid colon. There is a small amount of free fluid in the right upper quadrant. Musculoskeletal: The bones are diffusely osteopenic. There is a new compression fracture of the superior endplate of T12, mild, which appears acute or subacute. L4 and L2 compression deformities are stable and chronic. IMPRESSION: 1. Wall thickening of the duodenum and gastric antrum with surrounding inflammation and free air worrisome for  perforated gastric or duodenal ulcer. 2. Cholelithiasis with mild stranding and fluid surrounding the gallbladder. Findings are worrisome for acute cholecystitis. Please correlate clinically. 3. Large left inguinal hernia containing nondilated sigmoid colon. 4. Nonobstructing left renal calculus. 5. Acute or subacute compression fracture of the superior endplate of T12. 6. Left Bosniak I benign renal cyst measuring 1.8 cm. No follow-up imaging is recommended. JACR 2018 Feb; 264-273, Management of the Incidental Renal Mass on CT, [employer] 2021; 814-848, Bosniak Classification of Cystic Renal Masses, Version 2019. Aortic Atherosclerosis (ICD10-I70.0). Electronically Signed   By: Darliss Cheney M.D.   On: 09/10/2023 18:09   DG Chest Portable 1 View  Result Date: 09/10/2023 CLINICAL DATA:  Shortness of breath EXAM: PORTABLE CHEST 1 VIEW COMPARISON:  Chest x-ray 04/02/2021. FINDINGS: There are new patchy airspace opacities in the left upper lobe. 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 There some scattered interstitial opacities in both lungs which may be related to mild edema or chronic changes. The costophrenic angles are clear. There is no pneumothorax or acute fracture. The cardiomediastinal silhouette is within normal limits. No acute fractures are seen. IMPRESSION: 1. New patchy airspace opacities in the left upper lobe, concerning for pneumonia. Follow-up PA and lateral chest x-ray in 4-6 weeks recommended to confirm complete resolution and to exclude underlying pathology. 2. COPD. Electronically Signed   By: Darliss Cheney M.D.   On: 09/10/2023 17:42    Procedures .Critical Care  Performed by: Ma Rings, PA-C Authorized by: Ma Rings, PA-C   Critical care provider statement:    Critical care time (minutes):  30   Critical care was necessary to treat or prevent imminent or life-threatening deterioration of the following conditions:  Sepsis   Critical care was time spent personally  by me on the following activities:  Development of treatment plan with patient or surrogate, discussions with consultants, evaluation of patient's response to treatment, examination of patient, ordering and review of laboratory studies, ordering and review of radiographic studies, ordering and performing treatments and interventions, pulse oximetry, re-evaluation of patient's condition, review of old charts and obtaining history from patient or surrogate   Care discussed with comment:  Dr. Henreitta Leber, Dr.Olalere     Medications Ordered in ED Medications  fentaNYL (SUBLIMAZE) injection 25 mcg (25 mcg  Intravenous Patient Refused/Not Given 09/10/23 1655)  lactated ringers infusion ( Intravenous New Bag/Given 09/10/23 1846)  metroNIDAZOLE (FLAGYL) IVPB 500 mg (500 mg Intravenous New Bag/Given 09/10/23 1851)  ceFEPIme (MAXIPIME) 2 g in sodium chloride 0.9 % 100 mL IVPB (2 g Intravenous New Bag/Given 09/10/23 1847)  lactated ringers bolus 500 mL (500 mLs Intravenous New Bag/Given 09/10/23 1655)  iohexol (OMNIPAQUE) 300 MG/ML solution 100 mL (100 mLs Intravenous Contrast Given 09/10/23 1739)  pantoprazole (PROTONIX) injection 40 mg (40 mg Intravenous Given 09/10/23 1850)    ED Course/ Medical Decision Making/ A&P                                 Medical Decision Making This patient presents to the ED for concern of abdominal pain and constipation for the past several days, this involves an extensive number of treatment options, and is a complaint that carries with it a high risk of complications and morbidity.  The differential diagnosis includes constipation, bowel obstruction, gastritis, peptic ulcer disease, cholecystitis, pancreatitis, perforated bowel, other   Co morbidities that complicate the patient evaluation :   COPD with chronic respiratory failure baseline 2 to 3 L of oxygen   Additional history obtained:  Additional history obtained from EMR External records from outside source  obtained and reviewed including notes, labs   Lab Tests:  I Ordered, and personally interpreted labs.  The pertinent results include: Patient noted to have leukocytosis with white blood cell count of 16, hemoglobin is 12.2 down from about 14 a year ago, urinalysis is significant for microscopic hematuria no signs of infection,   Imaging Studies ordered:  I ordered imaging studies including CT abdomen pelvis which shows free air in the stomach excision for perforated ulcer, stranding around gallbladder I independently visualized and interpreted imaging within scope of identifying emergent findings  I agree with the radiologist interpretation   Cardiac Monitoring: / EKG:  The patient was maintained on a cardiac monitor.  I personally viewed and interpreted the cardiac monitored which showed an underlying rhythm of: Sinus tachycardia   Consultations Obtained:  I requested consultation with the on-call general surgeon Dr. Henreitta Leber,  and discussed lab and imaging findings as well as pertinent plan - they recommend: She plans to take the patient to the OR for repair of his perforated gastric ulcer but due to patient's baseline COPD and chronic respiratory failure she plans to send patient to ICU at [facility] for management after he goes to the OR.  She requested I speak with critical care to confirm, I spoke with Dr. Wynona Neat who is willing to accept patient, he will be going off service so Dr. Henreitta Leber will contact the on-call critical care doctor after surgery   Problem List / ED Course / Critical interventions / Medication management  Patient having several days of abdominal pain, not able to have a bowel movement, he is ill-appearing and tachycardic with a leukocytosis, ordered blood cultures and lactate due to patient meeting SIRS criteria.  Lactate is 1.2, patient is not hypotensive, does not need the full fluid bolus but given small bolus and infusion of lactated Ringer's.  CT abdomen  pelvis as above shows most notably likely perforated gastric ulcer, also concerns for some stranding around the gallbladder.  Given cefepime and Flagyl, consult with surgery as above as well as critical care for management after he goes to the OR.  Patient was informed of the  findings and is agreeable.  He has been n.p.o. all day today, side from a sip of water with his oxycodone this morning he refused any pain medications the ED.  I have reviewed the patients home medicines and have made adjustments as needed      Amount and/or Complexity of Data Reviewed Labs: ordered. Radiology: ordered.  Risk Prescription drug management.           Final Clinical Impression(s) / ED Diagnoses Final diagnoses:  Acute gastric ulcer with perforation (HCC)  Sepsis, due to unspecified organism, unspecified whether acute organ dysfunction present [facility])    Rx / DC Orders ED Discharge Orders     None         Ma Rings, PA-C 09/10/23 1859    Cathren Laine, MD 09/11/23 1421

## 2023-09-10 NOTE — Anesthesia Preprocedure Evaluation (Signed)
Anesthesia Evaluation  Patient identified by MRN, date of birth, ID band Patient awake    Reviewed: Allergy & Precautions, H&P , NPO status , Patient's Chart, lab work & pertinent test results, reviewed documented beta blocker date and time   Airway Mallampati: II  TM Distance: >3 FB Neck ROM: full    Dental no notable dental hx. (+) Missing, Poor Dentition   Pulmonary neg pulmonary ROS, COPD, Current Smoker   Pulmonary exam normal breath sounds clear to auscultation       Cardiovascular Exercise Tolerance: Good negative cardio ROS  Rhythm:regular Rate:Normal     Neuro/Psych negative neurological ROS  negative psych ROS   GI/Hepatic negative GI ROS, Neg liver ROS, PUD,,,  Endo/Other  negative endocrine ROS    Renal/GU negative Renal ROS  negative genitourinary   Musculoskeletal   Abdominal   Peds  Hematology negative hematology ROS (+)   Anesthesia Other Findings   Reproductive/Obstetrics negative OB ROS                             Anesthesia Physical Anesthesia Plan  ASA: 4 and emergent  Anesthesia Plan: General   Post-op Pain Management:    Induction:   PONV Risk Score and Plan:   Airway Management Planned:   Additional Equipment:   Intra-op Plan:   Post-operative Plan: Post-operative intubation/ventilation and Possible Post-op intubation/ventilation  Informed Consent: I have reviewed the patients History and Physical, chart, labs and discussed the procedure including the risks, benefits and alternatives for the proposed anesthesia with the patient or authorized representative who has indicated his/her understanding and acceptance.     Dental Advisory Given  Plan Discussed with: CRNA  Anesthesia Plan Comments:        Anesthesia Quick Evaluation

## 2023-09-10 NOTE — Transfer of Care (Signed)
Immediate Anesthesia Transfer of Care Note  Patient: Corey Griffin  Procedure(s) Performed: EXPLORATORY LAPAROTOMY,  graham patch, drain placement  Patient Location: PACU  Anesthesia Type:General  Level of Consciousness: Patient remains intubated per anesthesia plan  Airway & Oxygen Therapy: Patient remains intubated per anesthesia plan and Patient placed on Ventilator (see vital sign flow sheet for setting)  Post-op Assessment: Report given to RN and Post -op Vital signs reviewed and stable  Post vital signs: Reviewed and stable  Last Vitals:  Vitals Value Taken Time  BP 138/87 09/10/23 2153  Temp 97.5   Pulse 96 09/10/23 2156  Resp 18 09/10/23 2156  SpO2 100 % 09/10/23 2156  Vitals shown include unfiled device data.  Last Pain:  Vitals:   09/10/23 1616  TempSrc:   PainSc: 10-Worst pain ever         Complications: No notable events documented.

## 2023-09-10 NOTE — Progress Notes (Signed)
RT stayed with patient in PACU until [facility] arrived to pick patient up. Report has already been called to RT covering [facility] where patient will be going.

## 2023-09-10 NOTE — Anesthesia Postprocedure Evaluation (Signed)
Anesthesia Post Note  Patient: Corey Griffin  Procedure(s) Performed: EXPLORATORY LAPAROTOMY,  graham patch, drain placement  Patient location during evaluation: PACU Anesthesia Type: General Level of consciousness: sedated Pain management: pain level controlled Vital Signs Assessment: post-procedure vital signs reviewed and stable Respiratory status: patient on ventilator - see flowsheet for VS and patient remains intubated per anesthesia plan Cardiovascular status: blood pressure returned to baseline and stable Postop Assessment: no apparent nausea or vomiting Anesthetic complications: no   No notable events documented.   Last Vitals:  Vitals:   09/10/23 1900 09/10/23 1945  BP: 123/86 102/69  Pulse: (!) 116 (!) 137  Resp: (!) 25 (!) 25  Temp:    SpO2: 98% 97%    Last Pain:  Vitals:   09/10/23 1616  TempSrc:   PainSc: 10-Worst pain ever                 Windell Norfolk

## 2023-09-10 NOTE — Progress Notes (Addendum)
[facility]  Perforated gastric ulcer, primary closed and graham patch. JP drain in place.  Propofol/ Fentanyl for transport Intubated  Tachycardic, required minimal phenylephrine intraoperatively, weaned off post op NPO, NG in place PPI BID ordered  Would not feed enterally until get UGI in 5 days post op given size of the ulcer and patient's co-morbidities, frail appearance  JP in place  May need to consider TPN Cefepime/ Diflucan post op, would do 5 days per the STOP It trail Cultures sent Foley left in place   Will need EGD in 8 weeks or so.  Updated ICU Critical Care and CCS, Dr. Ivar Drape.  Updated daughter. Let her know the [facility]M unit number.   Algis Greenhouse, MD [facility] [address] [place], [place] [address] [phone] (office)

## 2023-09-10 NOTE — Progress Notes (Signed)
[facility]  Talked to [name] and discussed the plan and plan for transfer. Discussed his expected post operative course, discussed intubation, discussed ICU care in [facility], discussed likely SNF placement due to his deconditioning and COPD, sepsis.  Corey Greenhouse, MD [facility] [address] [name], [place] [address] [phone] (office)

## 2023-09-10 NOTE — Progress Notes (Signed)
[facility]  Perforated ulcer it appears on CT. Free air. Tachycardic and tachyphrenic. Will need to stay intubated likely with COPD, home O2 etc. Will get ED to talk to critical care. IV antibiotics ordered. Has received 1L of fluid and is on maintenance.   May need to give more fluids if BP drifts down again but will hold for now. OR called for emergency surgery.  Algis Greenhouse, MD [facility] [address] [place], [place] [address] [phone] (office)

## 2023-09-10 NOTE — Consult Note (Signed)
CODE SEPSIS - PHARMACY COMMUNICATION  **Broad Spectrum Antibiotics should be administered within 1 hour of Sepsis diagnosis**  Time Code Sepsis Called/Page Received: 1818  Antibiotics Ordered: cefepime, metronidazole  Time of 1st antibiotic administration: 1851  Additional action taken by pharmacy: N/A  Celene Squibb, PharmD Clinical Pharmacist 09/10/2023 6:46 PM

## 2023-09-10 NOTE — Progress Notes (Addendum)
[facility]  Lexi- Grandson's Girlfriend # [phone]; this is Ether Griffins, [place].   I have tried to call Anja, DR, and Lexi multiple times but none of these numbers were answered and DR did not even connect.  Discussed surgery with patient and staying intubated, transfer.  Discussed that another team will be caring for him at [facility].   I left Lexi and Anja a message with [facility] ED number for them to get in touch with [place].   Algis Greenhouse, MD [facility] [address] [place], [place] [address] [phone] (office)

## 2023-09-10 NOTE — H&P (Signed)
NAME:  Corey Griffin, MRN:  [id number], DOB:  02-24-1946, LOS: 0 ADMISSION DATE:  09/10/2023, CONSULTATION DATE:  10/13 REFERRING MD:  Dr. Henreitta Leber, CHIEF COMPLAINT:  perforated viscus   History of Present Illness:  Patient is a  77 yo M w/ pertinent PMH COPD on o2(seen by Dr. Sherene Sires), chronic pain, chronic constipation presents to [facility] ED on 10/13 w/ abd distention and pain.  Patient states he has had abd distention, constipation, and pain for several days. Uanble to eat or drink due to the pain. Denies any vomiting or diarrhea. Has hx of severe abd wall thinning. Concerned he has a blockage and came to [facility] on 10/13 for further workup. On arrival patient tachycardic 110s, afebrile, bp stable. Abd distended w/ pain on palpation. WBC 16. Cultures obtained and started on cefepime/flagyl. UA negative. Lipase wnl. CT abd/pelvis showing free air in stomach excision for perforated gastric ulcer. Surgery consulted. Taken to OR for ex lap with gastric ulcer repair, biopsy, graham patch placement. JP drain in place. Given hx of COPD plan to keep post op intubated overnight and transfer to [facility] ICU. PCCM consulted for transfer.  Pertinent  Medical History   Past Medical History:  Diagnosis Date   COPD (chronic obstructive pulmonary disease) (HCC)      Significant Hospital Events: Including procedures, antibiotic start and stop dates in addition to other pertinent events   10/13 admit to [facility] for gastric ulcer w/ free air in abd; taking to OR; post op remains intubated transfer to [facility]; pccm consulted  Interim History / Subjective:  Intubated on mech vent On prop for sedation Patient awake following commands  Objective   Blood pressure (!) 119/91, pulse (!) 119, temperature 97.6 F (36.4 C), temperature source Axillary, resp. rate (!) 22, height 5\' 9"  (1.753 m), weight 68 kg, SpO2 (!) 86%.       No intake or output data in the 24 hours ending 09/10/23 1904 Filed Weights   09/10/23 1617  Weight: 68  kg    Examination: General:  critically ill appearing on mech vent HEENT: MM pink/moist; ETT in place Neuro: sedate CV: s1s2, RRR, no m/r/g PULM:  dim BS bilaterally; on mech vent PRVC GI: soft, abd wound dressing and JP drain in place Extremities: warm/dry, no edema; b/l chronic venous insufficiency  Skin: no rashes or lesions    Resolved Hospital Problem list     Assessment & Plan:   Post op vent management LUL opacity: possible pna? Hx of COPD on O2 Plan: -CXR on arrival -LTVV strategy with tidal volumes of 6-8 cc/kg ideal body weight -check ABG and adjust settings accordingly  -Wean PEEP/FiO2 for SpO2 >92% -VAP bundle in place -Daily SAT and SBT -PAD protocol in place -wean sedation for RASS goal 0 to -1 -brovanana, pulmicort, yupelri; prn duoneb -pulm toiletry -abx cefepime per surgery and add azithro for atypical coverage -trach aspirate, urine legionella/strep, rvp  Perforated gastric ulcer s/p repair: around the duodenum and gastric antrum worrisome for gastric or duodenal ulcer -hx of severe thin abd wall and chronic constipation Plan: -post op care per surgery -npo until UGI in 5 days per surgery -pain management -PPI bid -pain management -cont cefepime/diflucan -follow cultures -f/u biopsy culture -iv fluids -trend LA -check abg, cbc, bmp, pt, ptt, inr post op  Sacral wound poa Plan: -woc consult  Elevated alk phosph Plan: -trend cmp  Anemia Plan: -trend cbc   Best Practice (right click and "Reselect all SmartList Selections" daily)  Diet/type: NPO DVT prophylaxis: SCD GI prophylaxis: PPI Lines: N/A Foley:  Yes, and it is still needed Code Status:  full code Last date of multidisciplinary goals of care discussion [10/13 attempted to contact family no answer]  Labs   CBC: Recent Labs  Lab 09/10/23 1640  WBC 16.1*  NEUTROABS 14.0*  HGB 12.2*  HCT 39.3  MCV 100.3*  PLT 617*    Basic Metabolic Panel: Recent Labs  Lab  09/10/23 1640  NA 137  K 5.0  CL 92*  CO2 32  GLUCOSE 108*  BUN 39*  CREATININE 0.81  CALCIUM 9.3   GFR: Estimated Creatinine Clearance: 72.3 mL/min (by C-G formula based on SCr of 0.81 mg/dL). Recent Labs  Lab 09/10/23 1640 09/10/23 1809  WBC 16.1*  --   LATICACIDVEN  --  1.2    Liver Function Tests: Recent Labs  Lab 09/10/23 1640  AST 22  ALT 18  ALKPHOS 185*  BILITOT 0.9  PROT 8.0  ALBUMIN 2.8*   Recent Labs  Lab 09/10/23 1640  LIPASE 20   No results for input(s): "AMMONIA" in the last 168 hours.  ABG    Component Value Date/Time   O2SAT 90.7 03/18/2016 2005     Coagulation Profile: No results for input(s): "INR", "PROTIME" in the last 168 hours.  Cardiac Enzymes: No results for input(s): "CKTOTAL", "CKMB", "CKMBINDEX", "TROPONINI" in the last 168 hours.  HbA1C: No results found for: "HGBA1C"  CBG: No results for input(s): "GLUCAP" in the last 168 hours.  Review of Systems:   Patient is intubated; therefore, history has been obtained from chart review.    Past Medical History:  He,  has a past medical history of COPD (chronic obstructive pulmonary disease) (HCC).   Surgical History:   Past Surgical History:  Procedure Laterality Date   COLONOSCOPY WITH PROPOFOL N/A 09/04/2018   Procedure: COLONOSCOPY WITH PROPOFOL;  Surgeon: Benancio Deeds, MD;  Location: [facility]COPY;  Service: Gastroenterology;  Laterality: N/A;   HAND SURGERY     POLYPECTOMY  09/04/2018   Procedure: POLYPECTOMY;  Surgeon: Benancio Deeds, MD;  Location: [facility]OSCOPY;  Service: Gastroenterology;;     Social History:   reports that he has been smoking cigarettes. He has a 50 pack-year smoking history. He has never used smokeless tobacco. He reports current alcohol use. He reports that he does not use drugs.   Family History:  His family history is not on file.   Allergies No Known Allergies   Home Medications  Prior to Admission medications    Medication Sig Start Date End Date Taking? Authorizing Provider  albuterol (PROVENTIL HFA;VENTOLIN HFA) 108 (90 Base) MCG/ACT inhaler Inhale 1-2 puffs into the lungs every 6 (six) hours as needed for wheezing or shortness of breath. 01/01/17   Blane Ohara, MD  albuterol (PROVENTIL) (2.5 MG/3ML) 0.083% nebulizer solution Take 3 mLs (2.5 mg total) by nebulization every 4 (four) hours as needed for wheezing or shortness of breath. 01/01/17   Blane Ohara, MD  budesonide-formoterol [facility]) 160-4.5 MCG/ACT inhaler Inhale 2 puffs into the lungs 2 (two) times daily. 05/23/22   Nyoka Cowden, MD  cefdinir (OMNICEF) 300 MG capsule Take 1 capsule (300 mg total) by mouth 2 (two) times daily. 03/24/23   Nyoka Cowden, MD  furosemide (LASIX) 40 MG tablet Take 1 tablet (40 mg total) by mouth daily for 90 doses. 08/16/22 11/14/22  Nyoka Cowden, MD  ipratropium (ATROVENT HFA) 17 MCG/ACT inhaler Inhale 2 puffs into  the lungs every 6 (six) hours. 01/01/17   Blane Ohara, MD  mometasone (NASONEX) 50 MCG/ACT nasal spray Place 1 spray into the nose 2 (two) times daily.     [provider]  oxycodone (OXY-IR) 5 MG capsule Take 5 mg by mouth every 6 (six) hours as needed for pain.  Patient not taking: Reported on 02/07/2022    [provider]  oxymetazoline (AFRIN) 0.05 % nasal spray Place 1 spray into both nostrils daily as needed for congestion. Patient not taking: Reported on 02/07/2022    [provider]  tamsulosin (FLOMAX) 0.4 MG CAPS capsule Take 0.4 mg by mouth daily.    [provider]     Critical care time: 45 minutes    JD Daryel [facility] 09/10/2023, 7:04 PM  Please see [email or web address] for pager details.  From 7A-7P if no response, please call [phone]. After hours, please call ELink [phone].

## 2023-09-10 NOTE — Progress Notes (Signed)
[facility] picked patient up. Report called to [employer]

## 2023-09-10 NOTE — Op Note (Signed)
[facility] Operative Note  09/10/23  Preoperative Diagnosis: Perforated gastric ulcer, free air    Postoperative Diagnosis: Same   Procedure(s) Performed: Exploratory laparotomy, primary repair  gastric ulcer (1.5cm), biopsy of ulcer edge, vascularized graham patch (omental patch)   Surgeon: Leatrice Jewels. Henreitta Leber, MD   Assistants: No qualified resident was available    Anesthesia: General endotracheal   Anesthesiologist: Windell Norfolk, MD    Specimens:Gastric biopsy edge of ulcer; culture     Estimated Blood Loss: Minimal   Blood Replacement: None    Complications: None   Wound Class: Dirty infected    Operative Indications: Mr. Elpers is a 77 yo who came in with abdominal pain and CT findings of a perforated ulcer. He has a history of COPD and was tachypneic and tachycardic in the ED. Due to his history and sepsis, I felt like he would need critical care and to remain intubated post operatively. I discussed surgery and this plan with the patient and finally got in touch with his daughter. Discussed risk of bleeding, infection, post operative plan, NPO and NG status, ICU care for transfer.   Findings:  Upon entering the abdomen (organ space), I encountered a phlegmon involving the antrum of the stomach and bilious peritonitis in the epigastric region .  1.5cm perforated ulcer, nodular edges biopsied     Procedure: The patient was taken to the operating room and placed supine. General endotracheal anesthesia was induced. Intravenous antibiotics were administered per protocol.  I reviewed his CT and given his thin abdominal wall opted to make a small upper epigastric incision over the area of free air to try to minimize the laparotomy and potential risk of hernia. The abdomen was prepped and draped in the usual sterile fashion.   A small 5cm incision was made in the epigastric region just a few cm inferior to the xiphoid.  The abdominal wall was then <1cm, and  I carefully used cautery to get to the fascia. The fascia was elevated and opened with a scissors. Upon entering the abdomen (organ space), I encountered a phlegmon involving the antrum of the stomach and bilious peritonitis in the epigastric region.  The perforated gastric ulcer was noted anteriorly and was noted to be about 1.5cm in size and nodular edges. I biopsied a edge with scissors and cauterized the area to prevent bleeding.  I closed the defect primarily with 3-0 Silk suture with full thickness bites primarily. I then oversewed the area with 3-0 Silk Lembert placed sutures.  I then placed two 3-0 Silk sutures across the repair and placed a vascularized omental patch (graham patch) over the area and secured it with the 3-0 Silk sutures.  Copious irrigation was performed. An NG was verified in the greater curve of the stomach. The gallbladder was visualized and was normal but was bathed in bile (CT had been concerned for cholecystitis). A JP was placed over the repair and brought out to the right of the incision. This was secured with a 3-0 Nylon.   On closing the abdomen, I opted to close the fascia with interrupted 0 PDS due to his risk of hernia. I closed the incision alternating superior and inferior interrupted sutures, burying the knot.  Luckily the majority of the incision overlayed the edge of the liver. The incision was irrigated. Marcaine was injected. Staples were used to close the skin.  A honeycomb and JP drain dressing was placed.   Final inspection revealed acceptable hemostasis. All counts were  correct at the end of the case. The patient was left intubated and transferred to the ICU at [facility] via [facility] operatively.    Algis Greenhouse, MD [facility] [address] [place], [place] [address] [phone] (office)

## 2023-09-10 NOTE — Progress Notes (Addendum)
Pharmacy Antibiotic Note  Corey Griffin is a 77 y.o. male admitted on 09/10/2023 with perforated gastric ulcer.  Pharmacy has been consulted for fluconazole dosing.  Started on cefepime, WBC 16.1, sCr 0.81, lactate 1.2, afebrile, flagyl x1 given Blood cultures and surgical wound culture collected  Plan: -Cefepime 2g IV every 8 hours per MD -Flagyl 500 mg IV x1 -Fluconazole 800mg  IV x1, followed by 400mg  IV every 24 hours -Monitor renal function -Follow up continuation of flagyl if warranted for anaerobic coverage -Follow up signs of clinical improvement, LOT (5 days per surgery note based on STOP-IT trial), de-escalation of antibiotics   Height: 5\' 9"  (175.3 cm) Weight: 68 kg (150 lb) IBW/kg (Calculated) : 70.7  Temp (24hrs), Avg:97.6 F (36.4 C), Min:97.5 F (36.4 C), Max:97.6 F (36.4 C)  Recent Labs  Lab 09/10/23 1640 09/10/23 1809  WBC 16.1*  --   CREATININE 0.81  --   LATICACIDVEN  --  1.2    Estimated Creatinine Clearance: 72.3 mL/min (by C-G formula based on SCr of 0.81 mg/dL).    No Known Allergies  Antimicrobials this admission: Cefepime 10/13 >>  fluconazole 10/13 >>   Microbiology results: 10/13 BCx:  10/13 Wound Cx:  10/13 MRSA PCR: sent  Thank you for allowing pharmacy to be a part of this patient's care.  Arabella Merles, PharmD. Clinical Pharmacist 09/10/2023 10:51 PM

## 2023-09-10 NOTE — ED Triage Notes (Signed)
Patient complains of abdominal pain and reports a bowel blockage.Pt takes linzess. Pt reports he is dependent on laxatives for regular bowel movements. Pt reports severe pain at 10/10

## 2023-09-11 ENCOUNTER — Other Ambulatory Visit: Payer: Self-pay

## 2023-09-11 DIAGNOSIS — J9600 Acute respiratory failure, unspecified whether with hypoxia or hypercapnia: Secondary | ICD-10-CM

## 2023-09-11 DIAGNOSIS — K251 Acute gastric ulcer with perforation: Secondary | ICD-10-CM | POA: Diagnosis not present

## 2023-09-11 LAB — CBC
HCT: 37.1 % — ABNORMAL LOW (ref 39.0–52.0)
HCT: 37.2 % — ABNORMAL LOW (ref 39.0–52.0)
Hemoglobin: 11.5 g/dL — ABNORMAL LOW (ref 13.0–17.0)
Hemoglobin: 11.7 g/dL — ABNORMAL LOW (ref 13.0–17.0)
MCH: 30.7 pg (ref 26.0–34.0)
MCH: 31.4 pg (ref 26.0–34.0)
MCHC: 30.9 g/dL (ref 30.0–36.0)
MCHC: 31.5 g/dL (ref 30.0–36.0)
MCV: 99.5 fL (ref 80.0–100.0)
MCV: 99.5 fL (ref 80.0–100.0)
Platelets: 541 10*3/uL — ABNORMAL HIGH (ref 150–400)
Platelets: 585 10*3/uL — ABNORMAL HIGH (ref 150–400)
RBC: 3.73 MIL/uL — ABNORMAL LOW (ref 4.22–5.81)
RBC: 3.74 MIL/uL — ABNORMAL LOW (ref 4.22–5.81)
RDW: 15.6 % — ABNORMAL HIGH (ref 11.5–15.5)
RDW: 15.7 % — ABNORMAL HIGH (ref 11.5–15.5)
WBC: 24.5 10*3/uL — ABNORMAL HIGH (ref 4.0–10.5)
WBC: 26.8 10*3/uL — ABNORMAL HIGH (ref 4.0–10.5)
nRBC: 0 % (ref 0.0–0.2)
nRBC: 0 % (ref 0.0–0.2)

## 2023-09-11 LAB — COMPREHENSIVE METABOLIC PANEL
ALT: 17 U/L (ref 0–44)
AST: 27 U/L (ref 15–41)
Albumin: 1.8 g/dL — ABNORMAL LOW (ref 3.5–5.0)
Alkaline Phosphatase: 117 U/L (ref 38–126)
Anion gap: 11 (ref 5–15)
BUN: 27 mg/dL — ABNORMAL HIGH (ref 8–23)
CO2: 30 mmol/L (ref 22–32)
Calcium: 8.6 mg/dL — ABNORMAL LOW (ref 8.9–10.3)
Chloride: 98 mmol/L (ref 98–111)
Creatinine, Ser: 0.78 mg/dL (ref 0.61–1.24)
GFR, Estimated: >60 mL/min (ref >60)
Glucose, Bld: 107 mg/dL — ABNORMAL HIGH (ref 70–99)
Potassium: 4.4 mmol/L (ref 3.5–5.1)
Sodium: 139 mmol/L (ref 135–145)
Total Bilirubin: 0.7 mg/dL (ref 0.3–1.2)
Total Protein: 5.9 g/dL — ABNORMAL LOW (ref 6.5–8.1)

## 2023-09-11 LAB — BASIC METABOLIC PANEL
Anion gap: 16 — ABNORMAL HIGH (ref 5–15)
BUN: 28 mg/dL — ABNORMAL HIGH (ref 8–23)
CO2: 31 mmol/L (ref 22–32)
Calcium: 9.2 mg/dL (ref 8.9–10.3)
Chloride: 94 mmol/L — ABNORMAL LOW (ref 98–111)
Creatinine, Ser: 0.89 mg/dL (ref 0.61–1.24)
GFR, Estimated: >60 mL/min (ref >60)
Glucose, Bld: 112 mg/dL — ABNORMAL HIGH (ref 70–99)
Potassium: 4 mmol/L (ref 3.5–5.1)
Sodium: 141 mmol/L (ref 135–145)

## 2023-09-11 LAB — GLUCOSE, CAPILLARY
Glucose-Capillary: 103 mg/dL — ABNORMAL HIGH (ref 70–99)
Glucose-Capillary: 108 mg/dL — ABNORMAL HIGH (ref 70–99)
Glucose-Capillary: 108 mg/dL — ABNORMAL HIGH (ref 70–99)
Glucose-Capillary: 138 mg/dL — ABNORMAL HIGH (ref 70–99)
Glucose-Capillary: 80 mg/dL (ref 70–99)
Glucose-Capillary: 99 mg/dL (ref 70–99)

## 2023-09-11 LAB — STREP PNEUMONIAE URINARY ANTIGEN
Strep Pneumo Urinary Antigen: NEGATIVE
Strep Pneumo Urinary Antigen: NEGATIVE

## 2023-09-11 LAB — LACTIC ACID, PLASMA
Lactic Acid, Venous: 1.7 mmol/L (ref 0.5–1.9)
Lactic Acid, Venous: 1.8 mmol/L (ref 0.5–1.9)

## 2023-09-11 LAB — PROTIME-INR
INR: 1.2 (ref 0.8–1.2)
Prothrombin Time: 15 s (ref 11.4–15.2)

## 2023-09-11 LAB — MAGNESIUM: Magnesium: 1.7 mg/dL (ref 1.7–2.4)

## 2023-09-11 LAB — APTT: aPTT: 37 s — ABNORMAL HIGH (ref 24–36)

## 2023-09-11 LAB — PHOSPHORUS: Phosphorus: 3.5 mg/dL (ref 2.5–4.6)

## 2023-09-11 LAB — MRSA NEXT GEN BY PCR, NASAL: MRSA by PCR Next Gen: NOT DETECTED

## 2023-09-11 LAB — TRIGLYCERIDES: Triglycerides: 63 mg/dL (ref <150)

## 2023-09-11 MED ORDER — ACETAMINOPHEN 10 MG/ML IV SOLN
1000.0000 mg | Freq: Four times a day (QID) | INTRAVENOUS | Status: AC
  Administered 2023-09-11 – 2023-09-12 (×4): 1000 mg via INTRAVENOUS
  Filled 2023-09-11 (×3): qty 100

## 2023-09-11 MED ORDER — MAGNESIUM SULFATE 2 GM/50ML IV SOLN: 2.0000 g | Freq: Once | INTRAVENOUS | Status: DC

## 2023-09-11 MED ORDER — ACETAMINOPHEN 10 MG/ML IV SOLN
1000.0000 mg | Freq: Four times a day (QID) | INTRAVENOUS | Status: DC
  Filled 2023-09-11: qty 100

## 2023-09-11 MED ORDER — MEDIHONEY WOUND/BURN DRESSING EX PSTE
1.0000 | PASTE | Freq: Every day | CUTANEOUS | Status: DC
  Administered 2023-09-11 – 2023-09-16 (×5): 1 via TOPICAL
  Filled 2023-09-11 (×2): qty 44

## 2023-09-11 MED ORDER — SODIUM CHLORIDE 0.9% FLUSH: 10.0000 mL | INTRAVENOUS | Status: DC | PRN

## 2023-09-11 MED ORDER — NOREPINEPHRINE 4 MG/250ML-% IV SOLN
INTRAVENOUS | Status: AC
  Administered 2023-09-11: 2 ug/min via INTRAVENOUS
  Filled 2023-09-11: qty 250

## 2023-09-11 MED ORDER — SODIUM CHLORIDE 0.9 % IV SOLN
250.0000 mL | INTRAVENOUS | Status: AC
  Administered 2023-09-11: 250 mL via INTRAVENOUS

## 2023-09-11 MED ORDER — NOREPINEPHRINE 4 MG/250ML-% IV SOLN: 2.0000 ug/min | INTRAVENOUS | Status: DC

## 2023-09-11 MED ORDER — TRACE MINERALS CU-MN-SE-ZN 300-55-60-3000 MCG/ML IV SOLN
INTRAVENOUS | Status: AC
  Filled 2023-09-11: qty 1000

## 2023-09-11 MED ORDER — SODIUM CHLORIDE 0.9% FLUSH
10.0000 mL | Freq: Two times a day (BID) | INTRAVENOUS | Status: DC
  Administered 2023-09-11 – 2023-09-17 (×13): 10 mL

## 2023-09-11 MED ORDER — INSULIN ASPART 100 UNIT/ML IJ SOLN: 0.0000 [IU] | Freq: Four times a day (QID) | INTRAMUSCULAR | Status: DC

## 2023-09-11 MED ORDER — THIAMINE HCL 100 MG/ML IJ SOLN
100.0000 mg | Freq: Every day | INTRAMUSCULAR | Status: AC
  Administered 2023-09-11 – 2023-09-15 (×5): 100 mg via INTRAVENOUS
  Filled 2023-09-11 (×5): qty 2

## 2023-09-11 MED ORDER — METRONIDAZOLE 500 MG/100ML IV SOLN
500.0000 mg | Freq: Two times a day (BID) | INTRAVENOUS | Status: DC
  Administered 2023-09-11 – 2023-09-14 (×7): 500 mg via INTRAVENOUS
  Filled 2023-09-11 (×7): qty 100

## 2023-09-11 MED ORDER — BISACODYL 10 MG RE SUPP: 10.0000 mg | Freq: Every day | RECTAL | Status: DC | PRN

## 2023-09-11 MED ORDER — SODIUM CHLORIDE 0.9 % IV SOLN
3.0000 g | Freq: Four times a day (QID) | INTRAVENOUS | Status: DC
  Administered 2023-09-11 – 2023-09-15 (×15): 3 g via INTRAVENOUS
  Filled 2023-09-11 (×16): qty 8

## 2023-09-11 MED ORDER — FAT EMUL FISH OIL/PLANT BASED 20% (SMOFLIPID)IV EMUL
250.0000 mL | INTRAVENOUS | Status: AC
  Administered 2023-09-11: 250 mL via INTRAVENOUS
  Filled 2023-09-11: qty 250

## 2023-09-11 MED ORDER — LACTATED RINGERS IV BOLUS
500.0000 mL | Freq: Once | INTRAVENOUS | Status: AC
  Administered 2023-09-11: 500 mL via INTRAVENOUS

## 2023-09-11 MED ORDER — PROPOFOL 1000 MG/100ML IV EMUL: 5.0000 ug/kg/min | INTRAVENOUS | Status: DC

## 2023-09-11 MED ORDER — MAGNESIUM SULFATE 2 GM/50ML IV SOLN
2.0000 g | Freq: Once | INTRAVENOUS | Status: AC
  Administered 2023-09-11: 2 g via INTRAVENOUS
  Filled 2023-09-11: qty 50

## 2023-09-11 MED ORDER — PANTOPRAZOLE SODIUM 40 MG IV SOLR: 40.0000 mg | Freq: Two times a day (BID) | INTRAVENOUS | Status: DC

## 2023-09-11 MED ORDER — WHITE PETROLATUM EX OINT
TOPICAL_OINTMENT | CUTANEOUS | Status: DC | PRN
  Filled 2023-09-11: qty 28.35

## 2023-09-11 MED ORDER — GERHARDT'S BUTT CREAM
TOPICAL_CREAM | Freq: Three times a day (TID) | CUTANEOUS | Status: DC
  Administered 2023-09-11: 1 via TOPICAL
  Filled 2023-09-11: qty 1

## 2023-09-11 MED ORDER — FENTANYL CITRATE PF 50 MCG/ML IJ SOSY
12.5000 ug | PREFILLED_SYRINGE | INTRAMUSCULAR | Status: DC | PRN
  Administered 2023-09-18: 25 ug via INTRAVENOUS
  Filled 2023-09-11: qty 1

## 2023-09-11 NOTE — Progress Notes (Signed)
NAME:  Corey Griffin, MRN:  [id number], DOB:  09-Oct-1946, LOS: 1 ADMISSION DATE:  09/10/2023, CONSULTATION DATE:  10/13 REFERRING MD:  Dr. Henreitta Leber, CHIEF COMPLAINT:  perforated viscus   History of Present Illness:  Patient is a  77 yo M w/ pertinent PMH COPD on o2(seen by Dr. Sherene Sires), chronic pain, chronic constipation presents to [facility] ED on 10/13 w/ abd distention and pain.  Patient states he has had abd distention, constipation, and pain for several days. Uanble to eat or drink due to the pain. Denies any vomiting or diarrhea. Has hx of severe abd wall thinning. Concerned he has a blockage and came to [facility] on 10/13 for further workup. On arrival patient tachycardic 110s, afebrile, bp stable. Abd distended w/ pain on palpation. WBC 16. Cultures obtained and started on cefepime/flagyl. UA negative. Lipase wnl. CT abd/pelvis showing free air in stomach excision for perforated gastric ulcer. Surgery consulted. Taken to OR for ex lap with gastric ulcer repair, biopsy, graham patch placement. JP drain in place. Given hx of COPD plan to keep post op intubated overnight and transfer to [facility] ICU. PCCM consulted for transfer.  Pertinent  Medical History   Past Medical History:  Diagnosis Date   COPD (chronic obstructive pulmonary disease) (HCC)      Significant Hospital Events: Including procedures, antibiotic start and stop dates in addition to other pertinent events   10/13 admit to [facility] for gastric ulcer w/ free air in abd; taking to OR; post op remains intubated transfer to [facility]; pccm consulted 10/14 Extubated  Interim History / Subjective:  Extubated successfully H. Pylori positive  Objective   Blood pressure 111/80, pulse 100, temperature 98.6 F (37 C), temperature source Oral, resp. rate 20, height 5\' 9"  (1.753 m), weight 65.3 kg, SpO2 95%.    Vent Mode: PSV;CPAP FiO2 (%):  [40 %] 40 % Set Rate:  [16 bmp] 16 bmp Vt Set:  [560 mL] 560 mL PEEP:  [5 cmH20] 5 cmH20 Pressure Support:  [8  cmH20] 8 cmH20 Plateau Pressure:  [14 cmH20-20 cmH20] 20 cmH20   Intake/Output Summary (Last 24 hours) at 09/11/2023 4742 Last data filed at 09/11/2023 0800 Gross per 24 hour  Intake 3912.34 ml  Output 1260 ml  Net 2652.34 ml   Filed Weights   09/10/23 1617 09/11/23 0600  Weight: 68 kg 65.3 kg    General:  chronically ill appearing M with temporal wasting and signs of poor nutrition in no acute distress HEENT: MM pink/moist Neuro: alert and oriented CV: s1s2 rrr, no m/r/g PULM:  clear bilaterally, extubated to [place] without tachypnea or accessory muscle use GI: soft, midline surgical dressing c/d/i  Extremities: warm/dry, no edema  Skin: stage 3 decubitus ulcer noted, POA    Labs: Mag 1.7 Creatinine 0.78 WBC 26.8   Resolved Hospital Problem list     Assessment & Plan:   Post op vent management LUL opacity Hx of COPD on O2 Clinically improved and passed SAT/SBT -extubated -continue brovana, pulmicort, yupelri; prn duoneb -pulm toiletry -on unasyn, follow trach aspirate, urine legionella/strep, rvp  Perforated gastric ulcer s/p repair H. Pylori around the duodenum and gastric antrum worrisome for gastric or duodenal ulcer -hx of severe thin abd wall and chronic constipation -extubated successfully, H. Pylori positive  -post op care per surgery, continue NGT to suction, NPO 5 days post-op, plan to start TPN when PICC in place -pain management -PPI bid -continue cefepime, flagyl, diflucan -pain management -follow cultures -f/u biopsy culture  Sacral wound poa -woc consult  Hypophosphatemia Hypomagnesemia Hypokalemia Protein calorie malnutrition-POA Concern for re-feeding syndrome, appears chronically malnourished -trend and replete electrolytes, thiamine added -says lives independently with his son and prepares his own meals -nutrition following   Anemia -trend cbc   Best Practice (right click and "Reselect all SmartList Selections" daily)    Diet/type: NPO DVT prophylaxis: SCD GI prophylaxis: PPI Lines: N/A Foley:  Yes, and it is still needed Code Status:  full code Last date of multidisciplinary goals of care discussion [10/13 attempted to contact family no answer] Attempt to reach family again today  Labs   CBC: Recent Labs  Lab 09/10/23 1640 09/10/23 2350 09/11/23 0011 09/11/23 0329  WBC 16.1*  --  24.5* 26.8*  NEUTROABS 14.0*  --   --   --   HGB 12.2* 11.9* 11.5* 11.7*  HCT 39.3 35.0* 37.2* 37.1*  MCV 100.3*  --  99.5 99.5  PLT 617*  --  585* 541*    Basic Metabolic Panel: Recent Labs  Lab 09/10/23 1640 09/10/23 2350 09/11/23 0011 09/11/23 0329  NA 137 134* 141 139  K 5.0 3.8 4.0 4.4  CL 92*  --  94* 98  CO2 32  --  31 30  GLUCOSE 108*  --  112* 107*  BUN 39*  --  28* 27*  CREATININE 0.81  --  0.89 0.78  CALCIUM 9.3  --  9.2 8.6*  MG  --   --   --  1.7  PHOS  --   --   --  3.5   GFR: Estimated Creatinine Clearance: 70.3 mL/min (by C-G formula based on SCr of 0.78 mg/dL). Recent Labs  Lab 09/10/23 1630 09/10/23 1640 09/10/23 1809 09/11/23 0011 09/11/23 0329  PROCALCITON 0.21  --   --   --   --   WBC  --  16.1*  --  24.5* 26.8*  LATICACIDVEN  --   --  1.2 1.8 1.7    Liver Function Tests: Recent Labs  Lab 09/10/23 1640 09/11/23 0329  AST 22 27  ALT 18 17  ALKPHOS 185* 117  BILITOT 0.9 0.7  PROT 8.0 5.9*  ALBUMIN 2.8* 1.8*   Recent Labs  Lab 09/10/23 1640  LIPASE 20   No results for input(s): "AMMONIA" in the last 168 hours.  ABG    Component Value Date/Time   PHART 7.404 09/10/2023 2350   PCO2ART 52.8 (H) 09/10/2023 2350   PO2ART 58 (L) 09/10/2023 2350   HCO3 33.0 (H) 09/10/2023 2350   TCO2 35 (H) 09/10/2023 2350   O2SAT 89 09/10/2023 2350     Coagulation Profile: Recent Labs  Lab 09/11/23 0011  INR 1.2    Cardiac Enzymes: No results for input(s): "CKTOTAL", "CKMB", "CKMBINDEX", "TROPONINI" in the last 168 hours.  HbA1C: No results found for:  "HGBA1C"  CBG: Recent Labs  Lab 09/10/23 2316 09/11/23 0356 09/11/23 0736  GLUCAP 92 138* 99    Review of Systems:   Patient is intubated; therefore, history has been obtained from chart review.    Past Medical History:  He,  has a past medical history of COPD (chronic obstructive pulmonary disease) (HCC).   Surgical History:   Past Surgical History:  Procedure Laterality Date   COLONOSCOPY WITH PROPOFOL N/A 09/04/2018   Procedure: COLONOSCOPY WITH PROPOFOL;  Surgeon: Benancio Deeds, MD;  Location: [facility];  Service: Gastroenterology;  Laterality: N/A;   HAND SURGERY     POLYPECTOMY  09/04/2018   Procedure:  POLYPECTOMY;  Surgeon: Benancio Deeds, MD;  Location: [facility];  Service: Gastroenterology;;     Social History:   reports that he has been smoking cigarettes. He has a 50 pack-year smoking history. He has never used smokeless tobacco. He reports current alcohol use. He reports that he does not use drugs.   Family History:  His family history is not on file.   Allergies No Known Allergies   Home Medications  Prior to Admission medications   Medication Sig Start Date End Date Taking? Authorizing Provider  albuterol (PROVENTIL HFA;VENTOLIN HFA) 108 (90 Base) MCG/ACT inhaler Inhale 1-2 puffs into the lungs every 6 (six) hours as needed for wheezing or shortness of breath. 01/01/17   Blane Ohara, MD  albuterol (PROVENTIL) (2.5 MG/3ML) 0.083% nebulizer solution Take 3 mLs (2.5 mg total) by nebulization every 4 (four) hours as needed for wheezing or shortness of breath. 01/01/17   Blane Ohara, MD  budesonide-formoterol [facility]) 160-4.5 MCG/ACT inhaler Inhale 2 puffs into the lungs 2 (two) times daily. 05/23/22   Nyoka Cowden, MD  cefdinir (OMNICEF) 300 MG capsule Take 1 capsule (300 mg total) by mouth 2 (two) times daily. 03/24/23   Nyoka Cowden, MD  furosemide (LASIX) 40 MG tablet Take 1 tablet (40 mg total) by mouth daily for 90 doses. 08/16/22  11/14/22  Nyoka Cowden, MD  ipratropium (ATROVENT HFA) 17 MCG/ACT inhaler Inhale 2 puffs into the lungs every 6 (six) hours. 01/01/17   Blane Ohara, MD  mometasone (NASONEX) 50 MCG/ACT nasal spray Place 1 spray into the nose 2 (two) times daily.     [provider]  oxycodone (OXY-IR) 5 MG capsule Take 5 mg by mouth every 6 (six) hours as needed for pain.  Patient not taking: Reported on 02/07/2022    [provider]  oxymetazoline (AFRIN) 0.05 % nasal spray Place 1 spray into both nostrils daily as needed for congestion. Patient not taking: Reported on 02/07/2022    [provider]  tamsulosin (FLOMAX) 0.4 MG CAPS capsule Take 0.4 mg by mouth daily.    [provider]     Critical care time: 40 minutes    CRITICAL CARE Performed by: Darcella Gasman Savalas Monje   Total critical care time: 40 minutes  Critical care time was exclusive of separately billable procedures and treating other patients.  Critical care was necessary to treat or prevent imminent or life-threatening deterioration.  Critical care was time spent personally by me on the following activities: development of treatment plan with patient and/or surrogate as well as nursing, discussions with consultants, evaluation of patient's response to treatment, examination of patient, obtaining history from patient or surrogate, ordering and performing treatments and interventions, ordering and review of laboratory studies, ordering and review of radiographic studies, pulse oximetry and re-evaluation of patient's condition.  Darcella Gasman Karmin Kasprzak, PA-C [place] [facility] See Amion for pager If no response to pager , please call [phone] until 7pm After 7:00 pm call Elink  [phone]

## 2023-09-11 NOTE — Progress Notes (Signed)
Pharmacy Electrolyte Replacement  Recent Labs:  Recent Labs    09/11/23 0329  K 4.4  MG 1.7  PHOS 3.5  CREATININE 0.78    Low Critical Values (K </= 2.5, Phos </= 1, Mg </= 1) Present: None  MD Contacted: N/a  Plan: Magnesium 2g IV x1. Follow-up labs in AM.   Link Snuffer, PharmD, BCPS, BCCCP Please refer to [facility] for [facility] Pharmacy numbers. 09/11/2023, 8:44 AM

## 2023-09-11 NOTE — Progress Notes (Signed)
PHARMACY - TOTAL PARENTERAL NUTRITION CONSULT NOTE   Indication:  Gastric Ulcer Perforation  Patient Measurements: Height: 5\' 9"  (175.3 cm) Weight: 65.3 kg (143 lb 15.4 oz) IBW/kg (Calculated) : 70.7 TPN AdjBW (KG): 68 Body mass index is 21.26 kg/m. Usual Weight: 180 lbs.  Assessment:  62 YOM admitted for perforation of gastric ulcer after presenting (10/13) with several days of abdominal pain and distention which prevented him from eating or drinking except for small sips of water. Underwent exploratory laparotomy, primary repair of gastric ulcer (1.5 cm), biopsy, and graham patch at [facility] (10/13). PMHx significant for chronic constipation and thinning of abdominal wall with abdominal distention, COPD on home O2. Unable to speak with patient due to intubation.  Presumed moderate-to-high refeeding risk given temporal wasting per MD. Plan for NPO for at least 5 days post-op per team. Pharmacy consulted for TPN.  Unable to make customized TPN and utilizing Clinimix 8/10 due to IVF shortage caused by [facility] per hospital policy. Will not be able to meet full nutrition goals due to limited stock and fluid restrictions. Will replace electrolytes and give lipids outside of Clinimix.   Glucose / Insulin: No Hx of DM, A1c unknown, BG < 180 - no insulin Electrolytes: K 4.4, Phos 3.5, Mag 1.7, CoCa 10.36, other lytes wnl Renal: Scr 0.78, BUN 27 (trending down) Hepatic: Albumin 1.8, AST/ALT/AlkPhos wnl Intake / Output; MIVF: UOP 1180 mL, JP drain 30 mL, EBL 50 mL  GI Imaging: 10/13 CT Abdomen/Pelvis: free air concerning for gastric or duodenal ulcer, likely acute cholecystitis  10/13 KUB: nonobstructive bowel gas GI Surgeries/Procedures:  10/13: Ex-lap, graham patch, JP drain and NGT placement 10/14: Extubated, propofol off  Central access: PICC ordered 10/14 TPN start date: 10/14  Nutritional Goals: Clinimix E 8/10 1000 mL/day MW @ 42 mL/hr (provides 80 g protein and 664 kcal  per day)  Clinimix E 8/10 2000 mL/day TTFriSatSun @ 82 mL/hr (provides 160 g protein and 1325 kcal per day) To provide an average of 137 g AA and 1586 Kcal per day.   RD Assessment:  Estimated Needs Total Energy Estimated Needs: 1900-2100 kcals Total Protein Estimated Needs: 110-125 g Total Fluid Estimated Needs: >/= 1.8 L  Current Nutrition:  TPN NPO  Plan:  Initiate Clinimix E 8/10 at 1800: Will alternate: - per day (42 ml/hr) on MTh - per day (82ml/hr) on TuWedFriSatSun - Smof lipid daily To provide an average of 137 g AA and 1586 Kcal per day.  Unable to meet full nutrition requirements due to Clinimix and volume limitations. Clinimix and SMOFlipid meeting 100% of protein needs and ~83% of caloric needs. Electrolytes:  - 1000 mL per day: Na 35 mEq, K 30 mEq, Mag 5 mEq, Phos 15 mmol, Acetate 83 mEq, Cl 76 mEq in Clinimix - 2000 mL per day: Na 70 mEq, K 60 mEq, Mag 10 mEq, Phos 30 mmol, Acetate 166 mEq, Cl 152 mEq in Clinimix - Additional Mag 2 g IV outside of TPN Add to clinimix standard MVI and trace elements, no chromium due to shortage Initiate sSSI q6h and adjust as needed Additional IVF per team Monitor TPN labs daily while on Clinimix  Alexxander Kurt PharmD Candidate 09/11/2023 9:04 AM

## 2023-09-11 NOTE — Progress Notes (Signed)
Initial Nutrition Assessment  DOCUMENTATION CODES:   Severe malnutrition in context of chronic illness  INTERVENTION:   TPN order per pharmacy. Given national IVF shortage, unable to compound custom TPN and utilizing pre-made mixtures and not likely to be able to meet 100% of nutritional needs. Recommend use of GI tract as soon as medically able.   Monitor magnesium, potassium, and phosphorus BID for at least 3 days, MD to replete as needed, as pt is at risk for refeeding syndrome given severe malnutrition with wt loss, inadequate oral intake and starting TPN.  Add Thiamine 100 mg IV daily x 5 days due to very high risk for refeeding.   Recommend Vitamin C 500 mg BID once able to take meds by mouth given pressure injury and current long time smoker, at risk for deficiency plus increased needs for wound healing. Pt at risk for other deficiencies as well and may very well benefit from further supplementation of other nutrients  NUTRITION DIAGNOSIS:   Severe Malnutrition related to chronic illness as evidenced by severe muscle depletion, severe fat depletion.   GOAL:   Patient will meet greater than or equal to 90% of their needs   MONITOR:   Diet advancement, Labs, Weight trends, Skin, I & O's (TPN)  REASON FOR ASSESSMENT:   Consult New TPN/TNA  ASSESSMENT:   77 yo male admitted with abd pain and distention, constipation and inability to eat/drink with CT showing free air with perforated gastric ulcer requiring surgery. PMH includes COPD-current every day smoker.  10/13 Perforated gastric ulcer requiring ex lap, primary repair of gastric ulcer, biopsy of ulcer edge and vascularized graham (omental) patch 10/14 Extubated  H.Pyloric positive  RD consult received for TPN. NPO Levophed at 4. NG tube in place with minimal output in cannister. JP drain with 30 mL  WOC consulted for stage 3 pressure injury to sacrum/buttocks. +pitting edema in BLE, especially in feet. Pt also  with multiple wounds to BLE.   Pt lives at home with his grandson. Pt has mostly been eating soup and Boost at home. Pt reports occasionally he eats a hot dog or spaghetti. Pt is edentulous but de  Pt reports weight loss but unsure how much. Pt reports he used to be 6'1" tall but now believes he is more like 5'9" (?). Current wt 65.3 kg (144 pounds) and this includes fluid weight. Unsure of EDW. Pt reports UBW around 180 pounds.   Pt complains of back pain on visit today which he indicates is chronic. Pt indicates he has "3 fractures" in his back. RN reports pt is wheelchair bound, pt indicates it is a "lift chair." Pt reports he began using assistance due to significant SOB with COPD, pt was worried he was going to fall.   Labs: potassium 4.4 (wdl), phosphorus 3.5 (wdl), magnesium 1.7 (wdl), Creatinine wdl, BUN 27 Meds: miralax, colace  NUTRITION - FOCUSED PHYSICAL EXAM:  Flowsheet Row Most Recent Value  Orbital Region Severe depletion  Upper Arm Region Severe depletion  Thoracic and Lumbar Region Severe depletion  Buccal Region Severe depletion  Temple Region Severe depletion  Clavicle Bone Region Severe depletion  Clavicle and Acromion Bone Region Severe depletion  Scapular Bone Region Severe depletion  Dorsal Hand Moderate depletion  Patellar Region Severe depletion  Anterior Thigh Region Severe depletion  Posterior Calf Region Unable to assess  Edema (RD Assessment) Moderate       Diet Order:   Diet Order  Diet NPO time specified  Diet effective now                   EDUCATION NEEDS:   Education needs have been addressed  Skin:  Skin Assessment: Skin Integrity Issues: Skin Integrity Issues:: Stage III Stage III: sacrum/buttocks (images in chart)  Last BM:  PTA  Height:   Ht Readings from Last 1 Encounters:  09/10/23 5\' 9"  (1.753 m)    Weight:   Wt Readings from Last 1 Encounters:  09/11/23 65.3 kg    BMI:  Body mass index is 21.26  kg/m.  Estimated Nutritional Needs:   Kcal:  1900-2100 kcals  Protein:  110-125 g  Fluid:  >/= 1.8 L    Romelle Starcher MS, RDN, LDN, CNSC Registered Dietitian 3 Clinical Nutrition RD Pager and On-Call Pager Number Located in [place]

## 2023-09-11 NOTE — Consult Note (Addendum)
WOC Nurse Consult Note: this consult performed remotely after review of EMR including photo documentation of wound and secure chat with bedside RN  Reason for Consult: sacral wound  Wound type: Stage 3 Pressure Injury Sacrum/buttocks  Pressure Injury POA: Yes Measurement: see nursing flowsheet  Wound bed: 50% yellow slough 50% pink moist  Drainage (amount, consistency, odor) see nursing flowsheet  Periwound: erythema, moisture associated skin damage  ICD-10 CM Codes for Irritant Dermatitis  L24A2 - Due to fecal, urinary or dual incontinence Dressing procedure/placement/frequency: Cleanse sacrum/buttocks with soap and water, dry and apply Medihoney to wound beds daily, cover with dry gauze. Coat surrounding skin with Gerhardt's butt cream 3 times a day and prn soiling.  Would leave silicone foam off to avoid holding moisture onto skin and place ABD pad instead.    Patient would benefit from low air loss mattress when out of ICU setting for moisture management and pressure redistribution.    POC discussed with bedside nurse. WOC team will not follow at this time. Re-consult if further needs arise.    Thank you,    Priscella Mann MSN, RN-BC, [employer] [phone]

## 2023-09-11 NOTE — Progress Notes (Signed)
Orthopedic Tech Progress Note Patient Details:  Corey Griffin May 12, 1946 [id number] Applied Unna Boots per order. Ortho Devices Type of Ortho Device: Radio broadcast assistant Ortho Device/Splint Location: BLE Ortho Device/Splint Interventions: Ordered, Application, Adjustment   Post Interventions Patient Tolerated: Well Instructions Provided: Adjustment of device, Care of device  Blase Mess 09/11/2023, 4:39 PM
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
## 2023-09-11 NOTE — Progress Notes (Signed)
Peripherally Inserted Central Catheter Placement  The IV Nurse has discussed with the patient and/or persons authorized to consent for the patient, the purpose of this procedure and the potential benefits and risks involved with this procedure.  The benefits include less needle sticks, lab draws from the catheter, and the patient may be discharged home with the catheter. Risks include, but not limited to, infection, bleeding, blood clot (thrombus formation), and puncture of an artery; nerve damage and irregular heartbeat and possibility to perform a PICC exchange if needed/ordered by physician.  Alternatives to this procedure were also discussed.  Bard Power PICC patient education guide, fact sheet on infection prevention and patient information card has been provided to patient /or left at bedside.    PICC Placement Documentation  PICC Double Lumen 09/11/23 Right Basilic 36 cm 0 cm (Active)  Indication for Insertion or Continuance of Line Administration of hyperosmolar/irritating solutions (i.e. TPN, Vancomycin, etc.) 09/11/23 1819  Exposed Catheter (cm) 0 cm 09/11/23 1819  Site Assessment Clean, Dry, Intact 09/11/23 1819  Lumen #1 Status Flushed;Saline locked;Blood return noted 09/11/23 1819  Lumen #2 Status Flushed;Saline locked;Blood return noted 09/11/23 1819  Dressing Type Transparent;Securing device 09/11/23 1819  Dressing Status Antimicrobial disc in place;Clean, Dry, Intact 09/11/23 1819  Dressing Intervention New dressing 09/11/23 1819  Dressing Change Due  09/11/23 1819       Curt Jews 09/11/2023, 6:32 PM

## 2023-09-11 NOTE — Progress Notes (Signed)
Cortrak Tube Team Note:  Consult received to place a bridle on a 84F NG tube.   Bridle placed without incident, tube in the left nare. RN present during placement.   Greig Castilla, RD, LDN Clinical Dietitian RD pager # available in AMION  After hours/weekend pager # available in [facility]

## 2023-09-11 NOTE — Plan of Care (Signed)
  Problem: Education: Goal: Knowledge of General Education information will improve Description: Including pain rating scale, medication(s)/side effects and non-pharmacologic comfort measures Outcome: Progressing   Problem: Clinical Measurements: Goal: Respiratory complications will improve Outcome: Progressing   Problem: Activity: Goal: Risk for activity intolerance will decrease Outcome: Progressing   Problem: Elimination: Goal: Will not experience complications related to bowel motility Outcome: Not Progressing Goal: Will not experience complications related to urinary retention Outcome: Not Progressing
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
## 2023-09-11 NOTE — Procedures (Signed)
Extubation Procedure Note  Patient Details:   Name: Corey Griffin DOB: Apr 18, 1946 MRN: [id number]   Airway Documentation:    Vent end date: 09/11/23 Vent end time: 0918   Evaluation  O2 sats: stable throughout Complications: No apparent complications Patient did tolerate procedure well. Bilateral Breath Sounds: Clear, Diminished   Yes  Pt was extubated per MD order and placed on 3 L . Cuff leak was noted prior to extubation and no stridor post. Pt is stable at this time.   Merlene Laughter 09/11/2023, 9:19 AM

## 2023-09-11 NOTE — Progress Notes (Addendum)
1 Day Post-Op  Subjective: Endorsing some irritation related to the NGT. Denies other complaints.   ROS: See above, otherwise other systems negative  Objective: Vital signs in last 24 hours: Temp:  [97.5 F (36.4 C)-98.6 F (37 C)] 98.6 F (37 C) (10/14 0740) Pulse Rate:  [73-137] 100 (10/14 0826) Resp:  [16-28] 20 (10/14 0826) BP: (87-139)/(65-91) 111/80 (10/14 0800) SpO2:  [86 %-100 %] 95 % (10/14 0826) FiO2 (%):  [40 %] 40 % (10/14 0826) Weight:  [65.3 kg-68 kg] 65.3 kg (10/14 0600) Last BM Date :  (pta)  Intake/Output from previous day: 10/13 0701 - 10/14 0700 In: 3280.5 [I.V.:2768; IV Piggyback:512.5] Out: 1260 [Urine:1180; Drains:30; Blood:50] Intake/Output this shift: Total I/O In: 631.8 [I.V.:394.3; IV Piggyback:237.5] Out: -   PE: Gen: elderly male resting in bed Resp: equal chest rise CV: HR 120s on monitor Abd: soft, non-distended, midline dressing clean and dry, JP x 1 with SS output, NGT with thin yellow output  Lab Results:  Recent Labs    09/11/23 0011 09/11/23 0329  WBC 24.5* 26.8*  HGB 11.5* 11.7*  HCT 37.2* 37.1*  PLT 585* 541*   BMET Recent Labs    09/11/23 0011 09/11/23 0329  NA 141 139  K 4.0 4.4  CL 94* 98  CO2 31 30  GLUCOSE 112* 107*  BUN 28* 27*  CREATININE 0.89 0.78  CALCIUM 9.2 8.6*   PT/INR Recent Labs    09/11/23 0011  LABPROT 15.0  INR 1.2   CMP     Component Value Date/Time   NA 139 09/11/2023 0329   NA 149 (H) 02/07/2022 1604   K 4.4 09/11/2023 0329   CL 98 09/11/2023 0329   CO2 30 09/11/2023 0329   GLUCOSE 107 (H) 09/11/2023 0329   BUN 27 (H) 09/11/2023 0329   BUN 19 02/07/2022 1604   CREATININE 0.78 09/11/2023 0329   CALCIUM 8.6 (L) 09/11/2023 0329   PROT 5.9 (L) 09/11/2023 0329   ALBUMIN 1.8 (L) 09/11/2023 0329   AST 27 09/11/2023 0329   ALT 17 09/11/2023 0329   ALKPHOS 117 09/11/2023 0329   BILITOT 0.7 09/11/2023 0329   GFRNONAA >60 09/11/2023 0329   GFRAA >60 01/01/2017 0919   Lipase      Component Value Date/Time   LIPASE 20 09/10/2023 1640    Studies/Results: [place] EKG SITE RITE  Result Date: 09/11/2023 If Site Rite image not attached, placement could not be confirmed due to current cardiac rhythm.  DG Abd 1 View  Result Date: 09/10/2023 CLINICAL DATA:  NG tube EXAM: ABDOMEN - 1 VIEW COMPARISON:  None Available. FINDINGS: NG tube tip is in the distal stomach. Nonobstructive bowel gas pattern IMPRESSION: NG tube tip in the distal stomach. Electronically Signed   By: Charlett Nose M.D.   On: 09/10/2023 23:50   DG CHEST PORT 1 VIEW  Result Date: 09/10/2023 CLINICAL DATA:  ET tube EXAM: PORTABLE CHEST 1 VIEW COMPARISON:  09/10/2023 FINDINGS: Endotracheal tube is 4 cm above the carina. NG tube enters the stomach. Heart and mediastinal contours within normal limits. Aortic atherosclerosis. Patchy left upper lobe airspace disease, similar to prior study. Increasing right basilar atelectasis or infiltrate. No effusions or pneumothorax. IMPRESSION: Endotracheal tube 4 cm above the carina. Stable patchy left upper lobe airspace disease. Increasing right base atelectasis or infiltrate. Electronically Signed   By: Charlett Nose M.D.   On: 09/10/2023 23:49   CT ABDOMEN PELVIS W CONTRAST  Result Date: 09/10/2023 CLINICAL DATA:  Acute abdominal pain EXAM: CT ABDOMEN AND PELVIS WITH CONTRAST TECHNIQUE: Multidetector CT imaging of the abdomen and pelvis was performed using the standard protocol following bolus administration of intravenous contrast. RADIATION DOSE REDUCTION: This exam was performed according to the departmental dose-optimization program which includes automated exposure control, adjustment of the mA and/or kV according to patient size and/or use of iterative reconstruction technique. CONTRAST:  OMNIPAQUE IOHEXOL 300 MG/ML  SOLN COMPARISON:  CT abdomen and pelvis 05/23/2018. MRI lumbar spine 11/11/2020. FINDINGS: Lower chest: No acute abnormality. Hepatobiliary: Gallstones  are present. There is mild stranding and fluid surrounding the gallbladder. There is no biliary ductal dilatation. The liver is within normal limits. Pancreas: Unremarkable. No pancreatic ductal dilatation or surrounding inflammatory changes. Spleen: Normal in size without focal abnormality. Adrenals/Urinary Tract: The bladder is decompressed by Foley catheter. There is no hydronephrosis or perinephric fluid. There is a 1.4 x 1.0 by 1.8 cm calculus in the superior pole of the left kidney. There is a cyst in the inferior left kidney measuring 18 mm. Adrenal glands are within normal limits. Stomach/Bowel: There is some wall thickening of the duodenal the gastric antrum with a small amount of surrounding free air. There is a additional minimal free air in the right upper quadrant. The stomach is nondilated. There is no evidence for bowel obstruction. The cecum is high riding. The appendix is within normal limits. Vascular/Lymphatic: Aortic atherosclerosis. No enlarged abdominal or pelvic lymph nodes. Reproductive: Prostate gland is enlarged. Other: There is a large left inguinal hernia containing nondilated sigmoid colon. There is a small amount of free fluid in the right upper quadrant. Musculoskeletal: The bones are diffusely osteopenic. There is a new compression fracture of the superior endplate of T12, mild, which appears acute or subacute. L4 and L2 compression deformities are stable and chronic. IMPRESSION: 1. Wall thickening of the duodenum and gastric antrum with surrounding inflammation and free air worrisome for perforated gastric or duodenal ulcer. 2. Cholelithiasis with mild stranding and fluid surrounding the gallbladder. Findings are worrisome for acute cholecystitis. Please correlate clinically. 3. Large left inguinal hernia containing nondilated sigmoid colon. 4. Nonobstructing left renal calculus. 5. Acute or subacute compression fracture of the superior endplate of T12. 6. Left Bosniak I benign renal  cyst measuring 1.8 cm. No follow-up imaging is recommended. JACR 2018 Feb; 264-273, Management of the Incidental Renal Mass on CT, [employer] 2021; 814-848, Bosniak Classification of Cystic Renal Masses, Version 2019. Aortic Atherosclerosis (ICD10-I70.0). Electronically Signed   By: Darliss Cheney M.D.   On: 09/10/2023 18:09   DG Chest Portable 1 View  Result Date: 09/10/2023 CLINICAL DATA:  Shortness of breath EXAM: PORTABLE CHEST 1 VIEW COMPARISON:  Chest x-ray 04/02/2021. FINDINGS: There are new patchy airspace opacities in the left upper lobe. 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 There some scattered interstitial opacities in both lungs which may be related to mild edema or chronic changes. The costophrenic angles are clear. There is no pneumothorax or acute fracture. The cardiomediastinal silhouette is within normal limits. No acute fractures are seen. IMPRESSION: 1. New patchy airspace opacities in the left upper lobe, concerning for pneumonia. Follow-up PA and lateral chest x-ray in 4-6 weeks recommended to confirm complete resolution and to exclude underlying pathology. 2. COPD. Electronically Signed   By: Darliss Cheney M.D.   On: 09/10/2023 17:42    Anti-infectives: Anti-infectives (From admission, onward)    Start     Dose/Rate Route Frequency Ordered Stop   09/11/23 2345  fluconazole (DIFLUCAN)  IVPB 400 mg       Note to Pharmacy: Attention pharmacist: For 800 mg doses, change infusion duration to 240 min in verifcation via edit clinical information.  Placed in "Followed by" Linked Group   400 mg 100 mL/hr over 120 Minutes Intravenous Every 24 hours 09/10/23 2318     09/11/23 0600  cefoTEtan (CEFOTAN) 2 g in sodium chloride 0.9 % 100 mL IVPB        2 g 200 mL/hr over 30 Minutes Intravenous On call to O.R. 09/10/23 2007 09/10/23 2127   09/11/23 0000  azithromycin (ZITHROMAX) 500 mg in sodium chloride 0.9 % 250 mL IVPB        500 mg 250 mL/hr over 60 Minutes Intravenous Every 24  hours 09/10/23 2330     09/10/23 2330  fluconazole (DIFLUCAN) IVPB 800 mg       Note to Pharmacy: Attention pharmacist: For 800 mg doses, change infusion duration to 240 min in verifcation via edit clinical information.  Placed in "Followed by" Linked Group   800 mg 100 mL/hr over 240 Minutes Intravenous  Once 09/10/23 2318 09/11/23 0409   09/10/23 2019  sodium chloride 0.9 % with cefoTEtan (CEFOTAN) ADS Med       Note to Pharmacy: Sherlene Shams: cabinet override      09/10/23 2019 09/10/23 2113   09/10/23 1845  ceFEPIme (MAXIPIME) 2 g in sodium chloride 0.9 % 100 mL IVPB        2 g 200 mL/hr over 30 Minutes Intravenous Every 8 hours 09/10/23 1840 09/17/23 2159   09/10/23 1830  piperacillin-tazobactam (ZOSYN) IVPB 3.375 g  Status:  Discontinued        3.375 g 100 mL/hr over 30 Minutes Intravenous  Once 09/10/23 1815 09/10/23 1818   09/10/23 1830  ceFEPIme (MAXIPIME) 2 g in sodium chloride 0.9 % 100 mL IVPB  Status:  Discontinued        2 g 200 mL/hr over 30 Minutes Intravenous  Once 09/10/23 1818 09/10/23 1840   09/10/23 1830  metroNIDAZOLE (FLAGYL) IVPB 500 mg        500 mg 100 mL/hr over 60 Minutes Intravenous  Once 09/10/23 1818 09/10/23 1955       Assessment/Plan 78 y/o M who presented as a transfer from OSH after presenting with a perforated gastric ulcer and undergoing ex-lap and graham patch repair  - NPO with MIVF - NGT to LWS - Surgery will follow to discuss timing of UGI - Remainder of care per medicine/CCM  I reviewed last 24 h vitals and pain scores, last 24 h labs and trends, and last 24 h imaging results.  This care required moderate level of medical decision making.    LOS: 1 day   Tacy Learn Surgery 09/11/2023, 10:48 AM Please see Amion for pager number during day hours 7:00am-4:30pm or 7:00am -11:30am on weekends

## 2023-09-12 ENCOUNTER — Encounter (HOSPITAL_COMMUNITY): Payer: Self-pay | Admitting: General Surgery

## 2023-09-12 DIAGNOSIS — E43 Unspecified severe protein-calorie malnutrition: Secondary | ICD-10-CM | POA: Insufficient documentation

## 2023-09-12 DIAGNOSIS — K251 Acute gastric ulcer with perforation: Secondary | ICD-10-CM | POA: Diagnosis not present

## 2023-09-12 DIAGNOSIS — K631 Perforation of intestine (nontraumatic): Secondary | ICD-10-CM

## 2023-09-12 LAB — BASIC METABOLIC PANEL
Anion gap: 6 (ref 5–15)
Anion gap: 9 (ref 5–15)
BUN: 23 mg/dL (ref 8–23)
BUN: 23 mg/dL (ref 8–23)
CO2: 30 mmol/L (ref 22–32)
CO2: 30 mmol/L (ref 22–32)
Calcium: 8.5 mg/dL — ABNORMAL LOW (ref 8.9–10.3)
Calcium: 8.5 mg/dL — ABNORMAL LOW (ref 8.9–10.3)
Chloride: 99 mmol/L (ref 98–111)
Chloride: 102 mmol/L (ref 98–111)
Creatinine, Ser: 0.71 mg/dL (ref 0.61–1.24)
Creatinine, Ser: 0.74 mg/dL (ref 0.61–1.24)
GFR, Estimated: >60 mL/min (ref >60)
GFR, Estimated: >60 mL/min (ref >60)
Glucose, Bld: 416 mg/dL — ABNORMAL HIGH (ref 70–99)
Glucose, Bld: 111 mg/dL — ABNORMAL HIGH (ref 70–99)
Potassium: 4.8 mmol/L (ref 3.5–5.1)
Potassium: 3.8 mmol/L (ref 3.5–5.1)
Sodium: 135 mmol/L (ref 135–145)
Sodium: 141 mmol/L (ref 135–145)

## 2023-09-12 LAB — CBC
HCT: 30.6 % — ABNORMAL LOW (ref 39.0–52.0)
Hemoglobin: 9.6 g/dL — ABNORMAL LOW (ref 13.0–17.0)
MCH: 31.4 pg (ref 26.0–34.0)
MCHC: 31.4 g/dL (ref 30.0–36.0)
MCV: 100 fL (ref 80.0–100.0)
Platelets: 499 10*3/uL — ABNORMAL HIGH (ref 150–400)
RBC: 3.06 MIL/uL — ABNORMAL LOW (ref 4.22–5.81)
RDW: 16.1 % — ABNORMAL HIGH (ref 11.5–15.5)
WBC: 24.6 10*3/uL — ABNORMAL HIGH (ref 4.0–10.5)
nRBC: 0 % (ref 0.0–0.2)

## 2023-09-12 LAB — GLUCOSE, CAPILLARY
Glucose-Capillary: 113 mg/dL — ABNORMAL HIGH (ref 70–99)
Glucose-Capillary: 122 mg/dL — ABNORMAL HIGH (ref 70–99)
Glucose-Capillary: 100 mg/dL — ABNORMAL HIGH (ref 70–99)
Glucose-Capillary: 111 mg/dL — ABNORMAL HIGH (ref 70–99)

## 2023-09-12 LAB — PHOSPHORUS
Phosphorus: 4.6 mg/dL (ref 2.5–4.6)
Phosphorus: 2.7 mg/dL (ref 2.5–4.6)

## 2023-09-12 LAB — MAGNESIUM
Magnesium: 2.4 mg/dL (ref 1.7–2.4)
Magnesium: 2.3 mg/dL (ref 1.7–2.4)

## 2023-09-12 MED ORDER — ACETAMINOPHEN 10 MG/ML IV SOLN
1000.0000 mg | Freq: Once | INTRAVENOUS | Status: AC
  Administered 2023-09-12: 1000 mg via INTRAVENOUS
  Filled 2023-09-12: qty 100

## 2023-09-12 MED ORDER — TRACE MINERALS CU-MN-SE-ZN 300-55-60-3000 MCG/ML IV SOLN
INTRAVENOUS | Status: AC
  Filled 2023-09-12: qty 2000

## 2023-09-12 MED ORDER — FAT EMUL FISH OIL/PLANT BASED 20% (SMOFLIPID)IV EMUL
250.0000 mL | INTRAVENOUS | Status: AC
  Administered 2023-09-12: 250 mL via INTRAVENOUS
  Filled 2023-09-12: qty 250

## 2023-09-12 MED ORDER — SORBITOL 70 % SOLN
960.0000 mL | TOPICAL_OIL | Freq: Once | ORAL | Status: AC
  Administered 2023-09-12: 960 mL via RECTAL
  Filled 2023-09-12: qty 240

## 2023-09-12 MED ORDER — BISACODYL 10 MG RE SUPP
10.0000 mg | Freq: Every day | RECTAL | Status: DC
  Administered 2023-09-12 – 2023-09-17 (×5): 10 mg via RECTAL
  Filled 2023-09-12 (×6): qty 1

## 2023-09-12 NOTE — Evaluation (Signed)
Physical Therapy Evaluation Patient Details Name: Corey Griffin MRN: [id number] DOB: 1946-01-13 Today's Date: 09/12/2023  History of Present Illness  Corey Griffin presented 09/10/23 with abd pain; s/p gastric ulcer surgery 09/10/23 at [facility] and then transferred to [facility] on ventilator. PMH COPD on home O2, chronic constipation  Clinical Impression   Pt admitted secondary to problem above with deficits below. PTA patient was living at home with grandson assisting intermittently. He has arranged room/supplies so that he can reach everything while seated in his lift chair. He transfers to makeshift "[facility]" independently and with assist to wheelchair if necessary. He reports current lift chair developed electrical problem and he was unable to extend flat/bed-like position and has been sitting at all times resulting in his pressure sores to bil buttocks and sacrum. Reports he has ordered a new lift chair. Pt currently requires assist to raise and flex his legs with reports of incr back pain. Due to back pain, he refused to attempt sitting on EOB. Agreed to attempt if he is pre-medicated for pain prior to PT arrival (pain meds not yet due today). Feel patient can benefit from PT to regain at least functional transfers and he is willing to work towards this goal.  Anticipate patient will benefit from PT to address problems listed below.Will continue to follow acutely to maximize functional mobility independence and safety.           If plan is discharge home, recommend the following: Two people to help with walking and/or transfers;Assistance with cooking/housework;Assist for transportation;Help with stairs or ramp for entrance   Can travel by private vehicle   No    Equipment Recommendations None recommended by PT  Recommendations for Other Services  OT consult    Functional Status Assessment Patient has had a recent decline in their functional status and demonstrates the ability to make  significant improvements in function in a reasonable and predictable amount of time.     Precautions / Restrictions Precautions Precautions: Fall Restrictions Weight Bearing Restrictions: No      Mobility  Bed Mobility               General bed mobility comments: pt refused repositioning due to back pain    Transfers                        Ambulation/Gait                  Stairs            Wheelchair Mobility     Tilt Bed    Modified Rankin (Stroke Patients Only)       Balance                                             Pertinent Vitals/Pain Pain Assessment Pain Assessment: Faces Faces Pain Scale: Hurts whole lot Pain Location: back and RLE Pain Descriptors / Indicators: Aching, Constant, Discomfort, Grimacing Pain Intervention(s): Limited activity within patient's tolerance, Monitored during session, Patient requesting pain meds-RN notified (per RN, not yet time for tylenol)    Home Living Family/patient expects to be discharged to:: Private residence Living Arrangements: Other relatives (grandson) Available Help at Discharge: Family;Available PRN/intermittently (grandson works 10hr/days x 4 days/week (~5:30 am- 3:30 pm)) Type of Home: House Home Access: Ramped entrance  Home Layout: One level Home Equipment: Agricultural consultant (2 wheels);Wheelchair - manual;Lift chair Additional Comments: stays in lift chair at all times; reports he has refrigerator, hot plate and "BSC" all within reach; reports lift chair has a short and so he has not been able to lie flat in it for some time    Prior Function Prior Level of Function : Needs assist             Mobility Comments: "slides over" to "BSC"/trash can, otherwise stays in lift chair 24/7; stopped working on standing/walking ~3 months ago due to "stomach issues" ADLs Comments: reports he uses wipes to bathe; has created a "BSC" with trashcan and ziploc bags;  fixes simple meals from his chair with fridge and hot plate in reach     Extremity/Trunk Assessment   Upper Extremity Assessment Upper Extremity Assessment: Defer to OT evaluation    Lower Extremity Assessment Lower Extremity Assessment: RLE deficits/detail;LLE deficits/detail RLE Deficits / Details: +unna boot; AAROM hip/knee flexion 30 degrees-limited by pain; Ankle PROM to neutral; strength hip/knee 2+, ankle DF 3- RLE: Unable to fully assess due to pain LLE Deficits / Details: +unna boot; AAROM hip/knee flexion 100 degrees-limited by pain; Ankle AROM WFL; strength hip/knee 2+, ankle DF 3    Cervical / Trunk Assessment Cervical / Trunk Assessment:  (appears kyphotic, but in partial sidelying)  Communication   Communication Communication: No apparent difficulties Cueing Techniques: Verbal cues;Tactile cues  Cognition Arousal: Alert Behavior During Therapy: WFL for tasks assessed/performed                                   General Comments: oriented x 4; shows poor judgement with home situation and developing wounds to buttocks and sacrum        General Comments      Exercises     Assessment/Plan    PT Assessment Patient needs continued PT services  PT Problem List Decreased strength;Decreased range of motion;Decreased activity tolerance;Decreased balance;Decreased mobility;Decreased knowledge of use of DME;Decreased safety awareness;Cardiopulmonary status limiting activity;Decreased skin integrity;Pain       PT Treatment Interventions DME instruction;Gait training;Functional mobility training;Therapeutic activities;Therapeutic exercise;Balance training;Patient/family education;Wheelchair mobility training    PT Goals (Current goals can be found in the Care Plan section)  Acute Rehab PT Goals Patient Stated Goal: wants to be able to return home to his "set up" with lift chair PT Goal Formulation: With patient Time For Goal Achievement:  09/26/23 Potential to Achieve Goals: Fair    Frequency Min 1X/week     Co-evaluation               AM-PAC PT "6 Clicks" Mobility  Outcome Measure Help needed turning from your back to your side while in a flat bed without using bedrails?: A Lot Help needed moving from lying on your back to sitting on the side of a flat bed without using bedrails?: Total Help needed moving to and from a bed to a chair (including a wheelchair)?: Total Help needed standing up from a chair using your arms (e.g., wheelchair or bedside chair)?: Total Help needed to walk in hospital room?: Total Help needed climbing 3-5 steps with a railing? : Total 6 Click Score: 7    End of Session Equipment Utilized During Treatment: Oxygen Activity Tolerance: Patient limited by pain Patient left: in bed;with call bell/phone within reach Nurse Communication: Patient requests pain meds;Other (comment) (requests RLE unna  boot be changed due to discomfort; capillary refill WNL) PT Visit Diagnosis: Muscle weakness (generalized) (M62.81);Difficulty in walking, not elsewhere classified (R26.2);Adult, failure to thrive (R62.7);Pain Pain - Right/Left:  (midline) Pain - part of body:  (back)    Time: 6295-2841 PT Time Calculation (min) (ACUTE ONLY): 24 min   Charges:   PT Evaluation $PT Eval Low Complexity: 1 Low   PT General Charges $$ ACUTE PT VISIT: 1 Visit          Jerolyn Center, PT Acute Rehabilitation Services  Office [phone]   Zena Amos 09/12/2023, 12:38 PM

## 2023-09-12 NOTE — Evaluation (Signed)
Occupational Therapy Evaluation Patient Details Name: Corey Griffin MRN: [id number] DOB: 01/05/1946 Today's Date: 09/12/2023   History of Present Illness Corey Griffin presented 09/10/23 with abd pain; s/p gastric ulcer surgery 09/10/23 at [facility] and then transferred to [facility] on ventilator. PMH COPD on home O2, chronic constipation   Clinical Impression   Patient admitted for the diagnosis above.  PTA he lives at home, in what sounds like questionable conditions.  Patient describes having all needs within reach of lift chair, hot plate, make shift toilet, and is sedentary.  Does not sound as if the grandson he lives with assists, and the daughter, who was in the patient's room, is well aware of his situation.  Patient is refusing SNF, and OT is not sure HH would follow depending on the condition of the home.  OT will follow in the acute setting to increase ADL to baseline (uses wet whips while in his recliner), and describes stand pivot/slide to 3n1.         If plan is discharge home, recommend the following: Assist for transportation;Assistance with cooking/housework;A lot of help with bathing/dressing/bathroom;A lot of help with walking and/or transfers    Functional Status Assessment  Patient has had a recent decline in their functional status and demonstrates the ability to make significant improvements in function in a reasonable and predictable amount of time.  Equipment Recommendations  BSC/3in1    Recommendations for Other Services       Precautions / Restrictions Precautions Precautions: Fall Precaution Comments: Skin integrity Restrictions Weight Bearing Restrictions: No      Mobility Bed Mobility Overal bed mobility: Needs Assistance Bed Mobility: Rolling Rolling: Mod assist         General bed mobility comments: poor ROM to shoulders, unable to really reach for siderails.    Transfers                   General transfer comment: NT, patient  requesting bedpan      Balance                                           ADL either performed or assessed with clinical judgement   ADL Overall ADL's : Needs assistance/impaired     Grooming: Wash/dry hands;Wash/dry face;Set up;Bed level   Upper Body Bathing: Minimal assistance;Bed level   Lower Body Bathing: Maximal assistance;Bed level   Upper Body Dressing : Minimal assistance;Bed level   Lower Body Dressing: Total assistance;Bed level                       Vision Patient Visual Report: No change from baseline       Perception Perception: Not tested       Praxis Praxis: Not tested       Pertinent Vitals/Pain Pain Assessment Pain Assessment: Faces Faces Pain Scale: Hurts even more Pain Location: back and RLE at the ankle Pain Descriptors / Indicators: Aching, Constant, Discomfort, Grimacing Pain Intervention(s): Premedicated before session     Extremity/Trunk Assessment Upper Extremity Assessment Upper Extremity Assessment: RUE deficits/detail;LUE deficits/detail;Right hand dominant RUE Deficits / Details: limited shoulder AROM RUE Sensation: WNL RUE Coordination: WNL LUE Deficits / Details: Limited shoulder AROM, severe arthritis, ? RCT LUE: Shoulder pain with ROM LUE Sensation: WNL LUE Coordination: WNL   Lower Extremity Assessment Lower Extremity Assessment: Defer to  PT evaluation       Communication Communication Communication: No apparent difficulties   Cognition Arousal: Alert Behavior During Therapy: WFL for tasks assessed/performed Overall Cognitive Status: Within Functional Limits for tasks assessed                                       General Comments   VSS on RA    Exercises     Shoulder Instructions      Home Living Family/patient expects to be discharged to:: Private residence Living Arrangements: Other relatives Available Help at Discharge: Family;Available  PRN/intermittently Type of Home: House Home Access: Ramped entrance     Home Layout: One level     Bathroom Shower/Tub: Sponge bathes at baseline     Bathroom Accessibility: No   Home Equipment: Agricultural consultant (2 wheels);Wheelchair - manual;Lift chair;BSC/3in1   Additional Comments: stays in lift chair at all times; reports he has refrigerator, hot plate and "BSC" all within reach; reports lift chair has a short and so he has not been able to lie flat in it for some time      Prior Functioning/Environment               Mobility Comments: "slides over" to "BSC"/trash can, otherwise stays in lift chair 24/7; stopped working on standing/walking ~3 months ago due to "stomach issues" ADLs Comments: reports he uses wipes to bathe; has created a "BSC" with trashcan and ziploc bags; fixes simple meals from his chair with fridge and hot plate in reach        OT Problem List: Decreased strength;Decreased range of motion;Decreased activity tolerance;Impaired balance (sitting and/or standing);Pain;Decreased safety awareness      OT Treatment/Interventions: Self-care/ADL training;Therapeutic activities;Balance training;Patient/family education    OT Goals(Current goals can be found in the care plan section) Acute Rehab OT Goals Patient Stated Goal: Return home OT Goal Formulation: With patient Time For Goal Achievement: 09/26/23 Potential to Achieve Goals: Fair ADL Goals Pt Will Perform Upper Body Bathing: with set-up;sitting;bed level Pt Will Transfer to Toilet: with supervision;stand pivot transfer;bedside commode  OT Frequency: Min 1X/week    Co-evaluation              AM-PAC OT "6 Clicks" Daily Activity     Outcome Measure Help from another person eating meals?: None Help from another person taking care of personal grooming?: A Little Help from another person toileting, which includes using toliet, bedpan, or urinal?: Total Help from another person bathing (including  washing, rinsing, drying)?: A Lot Help from another person to put on and taking off regular upper body clothing?: A Lot Help from another person to put on and taking off regular lower body clothing?: Total 6 Click Score: 13   End of Session Nurse Communication: Mobility status  Activity Tolerance: Patient limited by pain Patient left: in bed;with family/visitor present  OT Visit Diagnosis: Pain Pain - Right/Left: Right Pain - part of body: Leg                Time: 1610-9604 OT Time Calculation (min): 16 min Charges:  OT General Charges $OT Visit: 1 Visit OT Evaluation $OT Eval Moderate Complexity: 1 Mod  09/12/2023  RP, OTR/L  Acute Rehabilitation Services  Office:  [phone]   Suzanna Obey 09/12/2023, 5:54 PM

## 2023-09-12 NOTE — TOC Initial Note (Addendum)
Transition of Care [facility]) - Initial/Assessment Note    Patient Details  Name: HUTTON PELLICANE MRN: [id number] Date of Birth: 1946/09/11  Transition of Care [facility]) CM/SW Contact:    Delilah Shan, LCSWA Phone Number: 09/12/2023, 4:06 PM  Clinical Narrative:                  CSW received consult for possible SNF placement at time of discharge. CSW spoke with patient at bedside regarding PT recommendation of SNF placement at time of discharge.Patient reports PTA he comes from home with Grandson.Patient expressed understanding of PT recommendation politely declined SNF placement at time of discharge.Patient is interested in [facility] services when medically ready for dc. CSW followed up on consult received.All questions answered. No concerns reported by patient.Patient reports he feels safe to return home when medically ready. Patient reports he has support of his Grandson and Daughter and friends.  Patient reports he has friends that he calls to bring him groceries and other things he may need at home. Patient request for CSW to add his Clyde Canterbury to his facesheet. Patient provided CSW with [place] telephone number. CSW added patients Grandson to his facesheet as requested.No further questions reported at this time. TOC will continue to follow.   Expected Discharge Plan: Home w Home Health Services Barriers to Discharge: Continued Medical Work up   Patient Goals and CMS Choice Patient states their goals for this hospitalization and ongoing recovery are:: to return back home with hh services   Choice offered to / list presented to : Patient      Expected Discharge Plan and Services In-house Referral: Clinical Social Work     Living arrangements for the past 2 months: Single Family Home                                      Prior Living Arrangements/Services Living arrangements for the past 2 months: Single Family Home Lives with::  Technical sales engineer) Patient language and need for  interpreter reviewed:: Yes Do you feel safe going back to the place where you live?: Yes      Need for Family Participation in Patient Care: Yes (Comment) Care giver support system in place?: Yes (comment)   Criminal Activity/Legal Involvement Pertinent to Current Situation/Hospitalization: No - Comment as needed  Activities of Daily Living      Permission Sought/Granted Permission sought to share information with : Case Manager, Magazine features editor, Family Supports Permission granted to share information with : Yes, Verbal Permission Granted  Share Information with NAME: Anja     Permission granted to share info w Relationship: daughter  Permission granted to share info w Contact Information: Meribeth Mattes [phone]  Emotional Assessment Appearance:: Appears stated age Attitude/Demeanor/Rapport: Gracious Affect (typically observed): Calm Orientation: : Oriented to Self, Oriented to Place, Oriented to  Time, Oriented to Situation Alcohol / Substance Use: Not Applicable Psych Involvement: No (comment)  Admission diagnosis:  Acute gastric ulcer with perforation (HCC) [K25.1] Sepsis, due to unspecified organism, unspecified whether acute organ dysfunction present [facility]) [A41.9] Bowel perforation (HCC) [K63.1] Gastric ulcer with perforation (HCC) [K25.5] Patient Active Problem List   Diagnosis Date Noted   Protein-calorie malnutrition, severe 09/12/2023   Acute respiratory failure (HCC) 09/11/2023   Acute gastric ulcer with perforation (HCC) 09/10/2023   Sepsis (HCC) 09/10/2023   Bowel perforation (HCC) 09/10/2023   Gastric ulcer with perforation (HCC) 09/10/2023   COPD GOLD ? /  MZ  02/07/2022   Chronic respiratory failure with hypoxia (HCC) 02/07/2022   Change in bowel habits    On home O2    Benign neoplasm of colon    PCP:  Benita Stabile, MD Pharmacy:   [facility] - [place], [place] - [address] [place] [address] Phone:  [phone] Fax: [phone]     Social Determinants of Health (SDOH) Social History: SDOH Screenings   Tobacco Use: High Risk (09/10/2023)   SDOH Interventions:     Readmission Risk Interventions     No data to display

## 2023-09-12 NOTE — Progress Notes (Signed)
Patient has requested to have tylenol given. I paged Cheri Fowler MD to request tyenol but it is a suppository. I notified him that the patient did not receive his enema from this morning. He gave a one  time dose of ofirmev 1000mg  . SEE MAR will continue to monitor patient .   PG respiratory for a breathing treament @ 1800

## 2023-09-12 NOTE — Progress Notes (Signed)
Progress Note  2 Days Post-Op  Subjective: Pt denies bowel function. Reports bad back pain with mobility. Reports he has not had a decent BM for 1 month.   Objective: Vital signs in last 24 hours: Temp:  [98 F (36.7 C)-98.9 F (37.2 C)] 98 F (36.7 C) (10/15 0741) Pulse Rate:  [80-126] 90 (10/15 0800) Resp:  [17-31] 27 (10/15 0800) BP: (84-106)/(46-82) 99/60 (10/15 0800) SpO2:  [87 %-100 %] 92 % (10/15 0800) Weight:  [66.1 kg] 66.1 kg (10/15 0443) Last BM Date :  (pta)  Intake/Output from previous day: 10/14 0701 - 10/15 0700 In: 2718.2 [I.V.:1330.8; IV Piggyback:1387.4] Out: 830 [Urine:575; Emesis/NG output:200; Drains:55] Intake/Output this shift: Total I/O In: 42 [I.V.:42] Out: -   PE: General: pleasant, WD, chronically ill appearing elderly  male who is laying in bed in NAD Heart: regular, rate, and rhythm Lungs: Respiratory effort nonlabored on [place] Abd: soft, appropriately ttp, dressing C/D/I, JP with bloody fluid, NGT with minimal clear fluid    Lab Results:  Recent Labs    09/11/23 0011 09/11/23 0329  WBC 24.5* 26.8*  HGB 11.5* 11.7*  HCT 37.2* 37.1*  PLT 585* 541*   BMET Recent Labs    09/11/23 0329 09/12/23 0750  NA 139 135  K 4.4 4.8  CL 98 99  CO2 30 30  GLUCOSE 107* 416*  BUN 27* 23  CREATININE 0.78 0.71  CALCIUM 8.6* 8.5*   PT/INR Recent Labs    09/11/23 0011  LABPROT 15.0  INR 1.2   CMP     Component Value Date/Time   NA 135 09/12/2023 0750   NA 149 (H) 02/07/2022 1604   K 4.8 09/12/2023 0750   CL 99 09/12/2023 0750   CO2 30 09/12/2023 0750   GLUCOSE 416 (H) 09/12/2023 0750   BUN 23 09/12/2023 0750   BUN 19 02/07/2022 1604   CREATININE 0.71 09/12/2023 0750   CALCIUM 8.5 (L) 09/12/2023 0750   PROT 5.9 (L) 09/11/2023 0329   ALBUMIN 1.8 (L) 09/11/2023 0329   AST 27 09/11/2023 0329   ALT 17 09/11/2023 0329   ALKPHOS 117 09/11/2023 0329   BILITOT 0.7 09/11/2023 0329   GFRNONAA >60 09/12/2023 0750   GFRAA >60 01/01/2017  0919   Lipase     Component Value Date/Time   LIPASE 20 09/10/2023 1640       Studies/Results: CT ABDOMEN PELVIS W CONTRAST  Addendum Date: 09/11/2023   ADDENDUM REPORT: 09/11/2023 18:42 ADDENDUM: These results were called by telephone at the time of interpretation on 09/10/2023 at 6:16 pm to provider CELESTE BEATTY , who verbally acknowledged these results. Electronically Signed   By: Darliss Cheney M.D.   On: 09/11/2023 18:42   Result Date: 09/11/2023 CLINICAL DATA:  Acute abdominal pain EXAM: CT ABDOMEN AND PELVIS WITH CONTRAST TECHNIQUE: Multidetector CT imaging of the abdomen and pelvis was performed using the standard protocol following bolus administration of intravenous contrast. RADIATION DOSE REDUCTION: This exam was performed according to the departmental dose-optimization program which includes automated exposure control, adjustment of the mA and/or kV according to patient size and/or use of iterative reconstruction technique. CONTRAST:  OMNIPAQUE IOHEXOL 300 MG/ML  SOLN COMPARISON:  CT abdomen and pelvis 05/23/2018. MRI lumbar spine 11/11/2020. FINDINGS: Lower chest: No acute abnormality. Hepatobiliary: Gallstones are present. There is mild stranding and fluid surrounding the gallbladder. There is no biliary ductal dilatation. The liver is within normal limits. Pancreas: Unremarkable. No pancreatic ductal dilatation or surrounding inflammatory changes. Spleen:  Normal in size without focal abnormality. Adrenals/Urinary Tract: The bladder is decompressed by Foley catheter. There is no hydronephrosis or perinephric fluid. There is a 1.4 x 1.0 by 1.8 cm calculus in the superior pole of the left kidney. There is a cyst in the inferior left kidney measuring 18 mm. Adrenal glands are within normal limits. Stomach/Bowel: There is some wall thickening of the duodenal the gastric antrum with a small amount of surrounding free air. There is a additional minimal free air in the right upper  quadrant. The stomach is nondilated. There is no evidence for bowel obstruction. The cecum is high riding. The appendix is within normal limits. Vascular/Lymphatic: Aortic atherosclerosis. No enlarged abdominal or pelvic lymph nodes. Reproductive: Prostate gland is enlarged. Other: There is a large left inguinal hernia containing nondilated sigmoid colon. There is a small amount of free fluid in the right upper quadrant. Musculoskeletal: The bones are diffusely osteopenic. There is a new compression fracture of the superior endplate of T12, mild, which appears acute or subacute. L4 and L2 compression deformities are stable and chronic. IMPRESSION: 1. Wall thickening of the duodenum and gastric antrum with surrounding inflammation and free air worrisome for perforated gastric or duodenal ulcer. 2. Cholelithiasis with mild stranding and fluid surrounding the gallbladder. Findings are worrisome for acute cholecystitis. Please correlate clinically. 3. Large left inguinal hernia containing nondilated sigmoid colon. 4. Nonobstructing left renal calculus. 5. Acute or subacute compression fracture of the superior endplate of T12. 6. Left Bosniak I benign renal cyst measuring 1.8 cm. No follow-up imaging is recommended. JACR 2018 Feb; 264-273, Management of the Incidental Renal Mass on CT, [employer] 2021; 814-848, Bosniak Classification of Cystic Renal Masses, Version 2019. Aortic Atherosclerosis (ICD10-I70.0). Electronically Signed: By: Darliss Cheney M.D. On: 09/10/2023 18:09   [place] EKG SITE RITE  Result Date: 09/11/2023 If Site Rite image not attached, placement could not be confirmed due to current cardiac rhythm.  [place] EKG SITE RITE  Result Date: 09/11/2023 If Site Rite image not attached, placement could not be confirmed due to current cardiac rhythm.  DG Abd 1 View  Result Date: 09/10/2023 CLINICAL DATA:  NG tube EXAM: ABDOMEN - 1 VIEW COMPARISON:  None Available. FINDINGS: NG tube tip is in the distal  stomach. Nonobstructive bowel gas pattern IMPRESSION: NG tube tip in the distal stomach. Electronically Signed   By: Charlett Nose M.D.   On: 09/10/2023 23:50   DG CHEST PORT 1 VIEW  Result Date: 09/10/2023 CLINICAL DATA:  ET tube EXAM: PORTABLE CHEST 1 VIEW COMPARISON:  09/10/2023 FINDINGS: Endotracheal tube is 4 cm above the carina. NG tube enters the stomach. Heart and mediastinal contours within normal limits. Aortic atherosclerosis. Patchy left upper lobe airspace disease, similar to prior study. Increasing right basilar atelectasis or infiltrate. No effusions or pneumothorax. IMPRESSION: Endotracheal tube 4 cm above the carina. Stable patchy left upper lobe airspace disease. Increasing right base atelectasis or infiltrate. Electronically Signed   By: Charlett Nose M.D.   On: 09/10/2023 23:49   DG Chest Portable 1 View  Result Date: 09/10/2023 CLINICAL DATA:  Shortness of breath EXAM: PORTABLE CHEST 1 VIEW COMPARISON:  Chest x-ray 04/02/2021. FINDINGS: There are new patchy airspace opacities in the left upper lobe. 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 There some scattered interstitial opacities in both lungs which may be related to mild edema or chronic changes. The costophrenic angles are clear. There is no pneumothorax or acute fracture. The cardiomediastinal silhouette is within normal limits. No acute  fractures are seen. IMPRESSION: 1. New patchy airspace opacities in the left upper lobe, concerning for pneumonia. Follow-up PA and lateral chest x-ray in 4-6 weeks recommended to confirm complete resolution and to exclude underlying pathology. 2. COPD. Electronically Signed   By: Darliss Cheney M.D.   On: 09/10/2023 17:42    Anti-infectives: Anti-infectives (From admission, onward)    Start     Dose/Rate Route Frequency Ordered Stop   09/11/23 2345  fluconazole (DIFLUCAN) IVPB 400 mg       Note to Pharmacy: Attention pharmacist: For 800 mg doses, change infusion duration to 240 min in  verifcation via edit clinical information.  Placed in "Followed by" Linked Group   400 mg 100 mL/hr over 120 Minutes Intravenous Every 24 hours 09/10/23 2318     09/11/23 1245  metroNIDAZOLE (FLAGYL) IVPB 500 mg        500 mg 100 mL/hr over 60 Minutes Intravenous Every 12 hours 09/11/23 1148     09/11/23 1245  Ampicillin-Sulbactam (UNASYN) 3 g in sodium chloride 0.9 % 100 mL IVPB        3 g 200 mL/hr over 30 Minutes Intravenous Every 6 hours 09/11/23 1148     09/11/23 0600  cefoTEtan (CEFOTAN) 2 g in sodium chloride 0.9 % 100 mL IVPB        2 g 200 mL/hr over 30 Minutes Intravenous On call to O.R. 09/10/23 2007 09/10/23 2127   09/11/23 0000  azithromycin (ZITHROMAX) 500 mg in sodium chloride 0.9 % 250 mL IVPB  Status:  Discontinued        500 mg 250 mL/hr over 60 Minutes Intravenous Every 24 hours 09/10/23 2330 09/11/23 1148   09/10/23 2330  fluconazole (DIFLUCAN) IVPB 800 mg       Note to Pharmacy: Attention pharmacist: For 800 mg doses, change infusion duration to 240 min in verifcation via edit clinical information.  Placed in "Followed by" Linked Group   800 mg 100 mL/hr over 240 Minutes Intravenous  Once 09/10/23 2318 09/11/23 0409   09/10/23 2019  sodium chloride 0.9 % with cefoTEtan (CEFOTAN) ADS Med       Note to Pharmacy: Sherlene Shams: cabinet override      09/10/23 2019 09/10/23 2113   09/10/23 1845  ceFEPIme (MAXIPIME) 2 g in sodium chloride 0.9 % 100 mL IVPB  Status:  Discontinued        2 g 200 mL/hr over 30 Minutes Intravenous Every 8 hours 09/10/23 1840 09/11/23 1148   09/10/23 1830  piperacillin-tazobactam (ZOSYN) IVPB 3.375 g  Status:  Discontinued        3.375 g 100 mL/hr over 30 Minutes Intravenous  Once 09/10/23 1815 09/10/23 1818   09/10/23 1830  ceFEPIme (MAXIPIME) 2 g in sodium chloride 0.9 % 100 mL IVPB  Status:  Discontinued        2 g 200 mL/hr over 30 Minutes Intravenous  Once 09/10/23 1818 09/10/23 1840   09/10/23 1830  metroNIDAZOLE (FLAGYL) IVPB 500  mg        500 mg 100 mL/hr over 60 Minutes Intravenous  Once 09/10/23 1818 09/10/23 1955        Assessment/Plan Perforated gastric ulcer  POD2 s/p ex-lap with graham patch 10/13 Dr. 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    Juliet Rude, [facility] 09/12/2023, 9:48 AM Please see Amion for pager number during day hours 7:00am-4:30pm

## 2023-09-12 NOTE — Progress Notes (Signed)
eLink Physician-Brief Progress Note Patient Name: Corey Griffin DOB: 1946/04/21 MRN: [id number]   Date of Service  09/12/2023  HPI/Events of Note  Has chronic back pain. Asking for anti-inflammatory. Instructed Bedside to give PRN fent for pain as well.  Also asking for a sleep aid. Patient is NPO w/ NGT to LIS  eICU Interventions  Fentanyl would aid with sleep. Will hold off on giving more sedating medications for now     Intervention Category Intermediate Interventions: Pain - evaluation and management  Darl Pikes 09/12/2023, 11:01 PM

## 2023-09-12 NOTE — Progress Notes (Addendum)
PHARMACY - TOTAL PARENTERAL NUTRITION CONSULT NOTE   Indication:  Gastric Ulcer Perforation  Patient Measurements: Height: 5\' 9"  (175.3 cm) Weight: 66.1 kg (145 lb 11.6 oz) IBW/kg (Calculated) : 70.7 TPN AdjBW (KG): 68 Body mass index is 21.52 kg/m. Usual Weight: 180 lbs.  Assessment:  38 YOM admitted for perforation of gastric ulcer after presenting (10/13) with several days of abdominal pain and distention which prevented him from eating or drinking except for small sips of water. Underwent exploratory laparotomy, primary repair of gastric ulcer (1.5 cm), biopsy, and graham patch at [facility] (10/13). PMHx significant for chronic constipation and thinning of abdominal wall with abdominal distention, COPD on home O2. Unable to speak with patient due to intubation.  Presumed moderate-to-high refeeding risk given temporal wasting per MD. Plan for NPO for at least 5 days post-op per team. Pharmacy consulted for TPN.  Unable to make customized TPN and utilizing Clinimix 8/10 due to IVF shortage caused by [facility] per hospital policy. Will not be able to meet full nutrition goals due to limited stock and fluid restrictions. Will replace electrolytes and give lipids outside of Clinimix.   Glucose / Insulin: No Hx of DM, A1c unknown, BG < 120 (BMP BG 416 likely contaminated with TPN> repeat BG 111) - no insulin Electrolytes: (first labs likely contaminated with TPN) on repeat:  CoCa 10.3, others wnl Renal: Scr 0.78, BUN 27 (trending down) Hepatic: Albumin 1.8, AlkPhos/AST/ALT/ Tbili wnl, TG 63 Intake / Output; MIVF: UOP 0.4 mL/kg/hr, NGT , JP drain 55 mL, LBM 1 month ago per patient  GI Imaging: 10/13 CT Abdomen/Pelvis: free air concerning for gastric or duodenal ulcer, likely acute cholecystitis  10/13 KUB: nonobstructive bowel gas GI Surgeries/Procedures:  10/13: Ex-lap, graham patch, JP drain and NGT placement 10/14: Extubated, propofol off  Central access: PICC 10/14 TPN  start date: 10/14  Nutritional Goals: Clinimix E 8/10 1000 mL/day MW @ 42 mL/hr (provides 80 g protein and 664 kcal per day)  Clinimix E 8/10 2000 mL/day TTFriSatSun @ 82 mL/hr (provides 160 g protein and 1325 kcal per day) To provide an average of 137 g AA and 1586 Kcal per day.   RD Assessment:  Estimated Needs Total Energy Estimated Needs: 1900-2100 kcals Total Protein Estimated Needs: 110-125 g Total Fluid Estimated Needs: >/= 1.8 L  Current Nutrition:  TPN NPO  Plan:  Initiate Clinimix E 8/10 at 1800: Will alternate: - per day (42 ml/hr) on M/Th - per day (60ml/hr) on Tu/Wed/Fri/Sat/Sun - Increase Smof lipid to on M/Th - Smof lipid on Tu/Wed/Fri/Sat/Sun To provide an average of 137 g AA and 1715 Kcal per day, meeting 100% of protein and 90% of Kcal goals; lipids provide 34% of kcals. Unable to meet full nutrition requirements due to Clinimix and volume limitations.   Electrolytes:  - 1000 mL per day: Na 35 mEq, K 30 mEq, Mag 5 mEq, Phos 15 mmol, Acetate 83 mEq, Cl 76 mEq in Clinimix - 2000 mL per day: Na 70 mEq, K 60 mEq, Mag 10 mEq, Phos 30 mmol, Acetate 166 mEq, Cl 152 mEq in Clinimix Add to clinimix standard MVI and trace elements, no chromium due to shortage Continue sSSI q6h and stop if not needed Additional IVF per team Monitor TPN labs daily while on Clinimix  Alphia Moh, PharmD, BCPS, [facility] Clinical Pharmacist  Please check AMION for all [facility] phone numbers After 10:00 PM, call Main Pharmacy [phone]

## 2023-09-12 NOTE — Progress Notes (Signed)
NAME:  Corey Griffin, MRN:  [id number], DOB:  07-08-1946, LOS: 2 ADMISSION DATE:  09/10/2023, CONSULTATION DATE:  10/13 REFERRING MD:  Dr. Henreitta Leber, CHIEF COMPLAINT:  perforated viscus   History of Present Illness:  Patient is a  77 yo M w/ pertinent PMH COPD on o2(seen by Dr. Sherene Sires), chronic pain, chronic constipation presents to [facility] ED on 10/13 w/ abd distention and pain.  Patient states he has had abd distention, constipation, and pain for several days. Uanble to eat or drink due to the pain. Denies any vomiting or diarrhea. Has hx of severe abd wall thinning. Concerned he has a blockage and came to [facility] on 10/13 for further workup. On arrival patient tachycardic 110s, afebrile, bp stable. Abd distended w/ pain on palpation. WBC 16. Cultures obtained and started on cefepime/flagyl. UA negative. Lipase wnl. CT abd/pelvis showing free air in stomach excision for perforated gastric ulcer. Surgery consulted. Taken to OR for ex lap with gastric ulcer repair, biopsy, graham patch placement. JP drain in place. Given hx of COPD plan to keep post op intubated overnight and transfer to [facility] ICU. PCCM consulted for transfer.  Pertinent  Medical History   Past Medical History:  Diagnosis Date   COPD (chronic obstructive pulmonary disease) (HCC)      Significant Hospital Events: Including procedures, antibiotic start and stop dates in addition to other pertinent events   10/13 admit to [facility] for gastric ulcer w/ free air in abd; taking to OR; post op remains intubated transfer to [facility]; pccm consulted 10/14 Extubated  Interim History / Subjective:  Patient was successfully extubated yesterday Came off of vasopressor support Receiving TPN  Objective   Blood pressure 99/60, pulse 90, temperature 98 F (36.7 C), temperature source Oral, resp. rate (!) 27, height 5\' 9"  (1.753 m), weight 66.1 kg, SpO2 92%.        Intake/Output Summary (Last 24 hours) at 09/12/2023 4098 Last data filed at 09/12/2023  0800 Gross per 24 hour  Intake 2128.36 ml  Output 830 ml  Net 1298.36 ml   Filed Weights   09/10/23 1617 09/11/23 0600 09/12/23 0443  Weight: 68 kg 65.3 kg 66.1 kg   Physical exam: General: Chronically ill-appearing male, lying on the bed HEENT: [place]/AT, eyes anicteric.  moist mucus membranes Neuro: Alert, awake following commands Chest: Coarse breath sounds, no wheezes or rhonchi Heart: Regular rate and rhythm, no murmurs or gallops Abdomen: Soft, drain in place, serous drainage leaking around, absent bowel sounds.  Patient has left inguinal hernia Skin: Stage III decubitus ulcer noted on sacrum, POA  Labs and images reviewed  Resolved Hospital Problem list     Assessment & Plan:  Acute respiratory insufficiency, postprocedure Aspiration pneumonia COPD, not in exacerbation Patient was successfully extubated yesterday Continue brovana, pulmicort, yupelri; prn duoneb Continue antibiotics  Perforated gastric ulcer s/p ex lap with Cheree Ditto patch H. Pylori induced PUD Appreciate general surgery follow-up Continue TPN Continue n.p.o. Continue NG tube on low intermittent wall suction Continue treatment for H. pylori with Unasyn, Flagyl, PPI Continue Diflucan Patient will be given smog enema per general surgery recommendations Monitor drain output  Stage III decubitus ulcer on sacrum, POA Continue wound care Wound care consult is requested  Hypophosphatemia Hypomagnesemia Hypokalemia Severe protein calorie malnutrition, POA Continue regular electrolyte replacement Continue TPN  Expected postop blood loss anemia Monitor H&H   Best Practice (right click and "Reselect all SmartList Selections" daily)   Diet/type: NPO DVT prophylaxis: SCD GI prophylaxis: PPI Lines:  N/A Foley: N/A Code Status:  full code Last date of multidisciplinary goals of care discussion: 10/15: Patient was updated at bedside  Labs   CBC: Recent Labs  Lab 09/10/23 1640 09/10/23 2350  09/11/23 0011 09/11/23 0329  WBC 16.1*  --  24.5* 26.8*  NEUTROABS 14.0*  --   --   --   HGB 12.2* 11.9* 11.5* 11.7*  HCT 39.3 35.0* 37.2* 37.1*  MCV 100.3*  --  99.5 99.5  PLT 617*  --  585* 541*    Basic Metabolic Panel: Recent Labs  Lab 09/10/23 1640 09/10/23 2350 09/11/23 0011 09/11/23 0329  NA 137 134* 141 139  K 5.0 3.8 4.0 4.4  CL 92*  --  94* 98  CO2 32  --  31 30  GLUCOSE 108*  --  112* 107*  BUN 39*  --  28* 27*  CREATININE 0.81  --  0.89 0.78  CALCIUM 9.3  --  9.2 8.6*  MG  --   --   --  1.7  PHOS  --   --   --  3.5   GFR: Estimated Creatinine Clearance: 71.1 mL/min (by C-G formula based on SCr of 0.78 mg/dL). Recent Labs  Lab 09/10/23 1630 09/10/23 1640 09/10/23 1809 09/11/23 0011 09/11/23 0329  PROCALCITON 0.21  --   --   --   --   WBC  --  16.1*  --  24.5* 26.8*  LATICACIDVEN  --   --  1.2 1.8 1.7    Liver Function Tests: Recent Labs  Lab 09/10/23 1640 09/11/23 0329  AST 22 27  ALT 18 17  ALKPHOS 185* 117  BILITOT 0.9 0.7  PROT 8.0 5.9*  ALBUMIN 2.8* 1.8*   Recent Labs  Lab 09/10/23 1640  LIPASE 20   No results for input(s): "AMMONIA" in the last 168 hours.  ABG    Component Value Date/Time   PHART 7.404 09/10/2023 2350   PCO2ART 52.8 (H) 09/10/2023 2350   PO2ART 58 (L) 09/10/2023 2350   HCO3 33.0 (H) 09/10/2023 2350   TCO2 35 (H) 09/10/2023 2350   O2SAT 89 09/10/2023 2350     Coagulation Profile: Recent Labs  Lab 09/11/23 0011  INR 1.2    Cardiac Enzymes: No results for input(s): "CKTOTAL", "CKMB", "CKMBINDEX", "TROPONINI" in the last 168 hours.  HbA1C: No results found for: "HGBA1C"  CBG: Recent Labs  Lab 09/11/23 1127 09/11/23 1538 09/11/23 1744 09/11/23 2336 09/12/23 0528  GLUCAP 103* 108* 80 108* 113*    Cheri Fowler, MD [place] [facility] See Amion for pager If no response to pager, please call [phone] until 7pm After 7pm, Please call E-link [phone]

## 2023-09-13 DIAGNOSIS — K251 Acute gastric ulcer with perforation: Secondary | ICD-10-CM | POA: Diagnosis not present

## 2023-09-13 LAB — BASIC METABOLIC PANEL
Anion gap: 5 (ref 5–15)
BUN: 23 mg/dL (ref 8–23)
CO2: 31 mmol/L (ref 22–32)
Calcium: 8.6 mg/dL — ABNORMAL LOW (ref 8.9–10.3)
Chloride: 105 mmol/L (ref 98–111)
Creatinine, Ser: 0.57 mg/dL — ABNORMAL LOW (ref 0.61–1.24)
GFR, Estimated: >60 mL/min (ref >60)
Glucose, Bld: 120 mg/dL — ABNORMAL HIGH (ref 70–99)
Potassium: 3.5 mmol/L (ref 3.5–5.1)
Sodium: 141 mmol/L (ref 135–145)

## 2023-09-13 LAB — LEGIONELLA PNEUMOPHILA SEROGP 1 UR AG
L. pneumophila Serogp 1 Ur Ag: NEGATIVE
L. pneumophila Serogp 1 Ur Ag: NEGATIVE

## 2023-09-13 LAB — GLUCOSE, CAPILLARY
Glucose-Capillary: 106 mg/dL — ABNORMAL HIGH (ref 70–99)
Glucose-Capillary: 110 mg/dL — ABNORMAL HIGH (ref 70–99)
Glucose-Capillary: 114 mg/dL — ABNORMAL HIGH (ref 70–99)
Glucose-Capillary: 118 mg/dL — ABNORMAL HIGH (ref 70–99)
Glucose-Capillary: 125 mg/dL — ABNORMAL HIGH (ref 70–99)

## 2023-09-13 LAB — PHOSPHORUS: Phosphorus: 2.3 mg/dL — ABNORMAL LOW (ref 2.5–4.6)

## 2023-09-13 LAB — SURGICAL PATHOLOGY

## 2023-09-13 LAB — MAGNESIUM: Magnesium: 2.1 mg/dL (ref 1.7–2.4)

## 2023-09-13 MED ORDER — ACETAMINOPHEN 10 MG/ML IV SOLN
1000.0000 mg | Freq: Once | INTRAVENOUS | Status: AC
  Administered 2023-09-13: 1000 mg via INTRAVENOUS
  Filled 2023-09-13: qty 100

## 2023-09-13 MED ORDER — ALBUTEROL SULFATE (2.5 MG/3ML) 0.083% IN NEBU
2.5000 mg | INHALATION_SOLUTION | RESPIRATORY_TRACT | Status: DC | PRN
  Administered 2023-09-13 – 2023-09-14 (×2): 2.5 mg via RESPIRATORY_TRACT
  Filled 2023-09-13 (×2): qty 3

## 2023-09-13 MED ORDER — LIDOCAINE 5 % EX PTCH
3.0000 | MEDICATED_PATCH | CUTANEOUS | Status: DC
  Filled 2023-09-13 (×2): qty 3

## 2023-09-13 MED ORDER — TRACE MINERALS CU-MN-SE-ZN 300-55-60-3000 MCG/ML IV SOLN
INTRAVENOUS | Status: AC
  Filled 2023-09-13 (×2): qty 2000

## 2023-09-13 MED ORDER — BISACODYL 10 MG RE SUPP: 10.0000 mg | Freq: Once | RECTAL | Status: DC

## 2023-09-13 MED ORDER — FAT EMUL FISH OIL/PLANT BASED 20% (SMOFLIPID)IV EMUL
250.0000 mL | INTRAVENOUS | Status: AC
  Administered 2023-09-13: 250 mL via INTRAVENOUS
  Filled 2023-09-13: qty 250

## 2023-09-13 MED ORDER — POTASSIUM PHOSPHATES 15 MMOLE/5ML IV SOLN
15.0000 mmol | Freq: Once | INTRAVENOUS | Status: AC
  Administered 2023-09-13: 15 mmol via INTRAVENOUS
  Filled 2023-09-13: qty 5

## 2023-09-13 NOTE — TOC Progression Note (Signed)
Transition of Care [facility]) - Progression Note    Patient Details  Name: Corey Griffin MRN: [id number] Date of Birth: 1946/09/27  Transition of Care [facility]) CM/SW Contact  Graves-Bigelow, Lamar Laundry, RN Phone Number: 09/13/2023, 4:30 PM  Clinical Narrative: Patient was discussed in progression rounds. Patient has declined SNF and wants to return home once stable. Patient states he is from home with grandson. Patient has DME: rolling walker, wheelchair, lift chair, bsc. Patient states he sleeps in his lift chair and declines a hospital bed. Daughter is at the bedside and states she is visiting from [place].  Daughter states patient is not ambulating and stays in the lift chair all day. Patient uses a hot plate next to his chair for meals. Daughter states some friends bring food and grocery by the home when needed. Patient has a shelf of canned foods near him and refrigerator is in arms distance. Patient has PCP; however, cannot make it to appointments. Per patient he has phone visits. Patient has Medicaid; Case Manager asked family to call DSS to see how many hours he has for personal care services. Patient will benefit from the personal care once he gets home and family is aware that they have to arrange this. Medicare.gov list provided to the patient for home health needs. Case Manager will follow up with patient regarding disposition needs.   Expected Discharge Plan: Home w Home Health Services Barriers to Discharge: Continued Medical Work up  Expected Discharge Plan and Services In-house Referral: Clinical Social Work Discharge Planning Services: CM Consult Post Acute Care Choice: Home Health Living arrangements for the past 2 months: Single Family Home  Social Determinants of Health (SDOH) Interventions SDOH Screenings   Tobacco Use: High Risk (09/10/2023)   Readmission Risk Interventions     No data to display

## 2023-09-13 NOTE — Progress Notes (Signed)
PROGRESS NOTE    Corey Griffin  WUJ:[id number] DOB: 1945/12/22 DOA: 09/10/2023 PCP: Benita Stabile, MD  Chief Complaint  Patient presents with   bowe blockage    Brief Narrative:   Patient is Corey Griffin  77 yo M w/ pertinent PMH COPD on o2(seen by Dr. Sherene Sires), chronic pain, chronic constipation presents to [facility] ED on 10/13 w/ abd distention and pain.   Patient states he has had abd distention, constipation, and pain for several days. Uanble to eat or drink due to the pain. Denies any vomiting or diarrhea. Has hx of severe abd wall thinning. Concerned he has Soila Printup blockage and came to [facility] on 10/13 for further workup. On arrival patient tachycardic 110s, afebrile, bp stable. Abd distended w/ pain on palpation. WBC 16. Cultures obtained and started on cefepime/flagyl. UA negative. Lipase wnl. CT abd/pelvis showing free air in stomach excision for perforated gastric ulcer. Surgery consulted. Taken to OR for ex lap with gastric ulcer repair, biopsy, graham patch placement. JP drain in place. Given hx of COPD plan to keep post op intubated overnight and transfer to [facility] ICU. PCCM consulted for transfer.  Significant Events 10/13 admit to [facility] for gastric ulcer w/ free air in abd; taking to OR; post op remains intubated transfer to [facility]; pccm consulted 10/14 Extubated TRH assumed care on 10/16  Assessment & Plan:   Principal Problem:   Acute gastric ulcer with perforation (HCC) Active Problems:   Sepsis (HCC)   Bowel perforation (HCC)   Gastric ulcer with perforation (HCC)   Acute respiratory failure (HCC)   Protein-calorie malnutrition, severe  Perforated Gastric Ulcer s/p Ex Lap with Cheree Ditto Patch S/p OR 10/13 with general surgery Appreciate surgery recs - continue NGT, UGI planned on Friday - follow pending path/culture (reactive gastropathy showing activity, ulceration, foveolar hyperplasia, and intestinal metaplasia.  Helicobacter stain negative.  Negative for dysplasia and carcinoma).  Culture with rare  candida albicans.   Prior notes indicated H pylori positive, but as above, biopsy notes Helicobacter stain was negative - will discuss with surgery Continue TPN per surgery Continue unasyn/fluconazole   Acute Respiratory Insufficiency Aspiration Pneumonia COPD not in Exacerbation  Currently on 3 L [place] Unasyn as above CXR 10/13 with increasing R base atelectasis or infiltrate  Follow, continue brovana, pulmicort, yuperlri and prn albuterol  On spiriva at home  Sinus Tachycardia Due to pain, above Will trend   Severe Protein Calorie Malnutrition TPN   Hypophosphatemia  Hypomagnesemia  Hypokalemia Replace and follow  Chronic Pain Takes oxy 10 q6 prn at home as needed  BPH  Flomax currently on hold  Stage III decub Appreciate wound care recs Pressure Injury 09/11/23 Buttocks Bilateral Stage 3 -  Full thickness tissue loss. Subcutaneous fat may be visible but bone, tendon or muscle are NOT exposed. red, pink, raw (Active)  09/11/23 1130  Location: Buttocks  Location Orientation: Bilateral  Staging: Stage 3 -  Full thickness tissue loss. Subcutaneous fat may be visible but bone, tendon or muscle are NOT exposed.  Wound Description (Comments): red, pink, raw  Present on Admission: Yes       DVT prophylaxis: SCD Code Status: full Family Communication: daughter at bedside Disposition:   Status is: Inpatient Remains inpatient appropriate because: continued need for inpatient care   Consultants:  Surgery Pccm   Procedures:  10/13  Exploratory laparotomy, primary repair  gastric ulcer (1.5cm), biopsy of ulcer edge, vascularized graham patch (omental patch)   Antimicrobials:  Anti-infectives (From admission, onward)  Start     Dose/Rate Route Frequency Ordered Stop   09/11/23 2345  fluconazole (DIFLUCAN) IVPB 400 mg       Note to Pharmacy: Attention pharmacist: For 800 mg doses, change infusion duration to 240 min in verifcation via edit clinical information.   Placed in "Followed by" Linked Group   400 mg 100 mL/hr over 120 Minutes Intravenous Every 24 hours 09/10/23 2318     09/11/23 1245  metroNIDAZOLE (FLAGYL) IVPB 500 mg        500 mg 100 mL/hr over 60 Minutes Intravenous Every 12 hours 09/11/23 1148     09/11/23 1245  Ampicillin-Sulbactam (UNASYN) 3 g in sodium chloride 0.9 % 100 mL IVPB        3 g 200 mL/hr over 30 Minutes Intravenous Every 6 hours 09/11/23 1148     09/11/23 0600  cefoTEtan (CEFOTAN) 2 g in sodium chloride 0.9 % 100 mL IVPB        2 g 200 mL/hr over 30 Minutes Intravenous On call to O.R. 09/10/23 2007 09/10/23 2127   09/11/23 0000  azithromycin (ZITHROMAX) 500 mg in sodium chloride 0.9 % 250 mL IVPB  Status:  Discontinued        500 mg 250 mL/hr over 60 Minutes Intravenous Every 24 hours 09/10/23 2330 09/11/23 1148   09/10/23 2330  fluconazole (DIFLUCAN) IVPB 800 mg       Note to Pharmacy: Attention pharmacist: For 800 mg doses, change infusion duration to 240 min in verifcation via edit clinical information.  Placed in "Followed by" Linked Group   800 mg 100 mL/hr over 240 Minutes Intravenous  Once 09/10/23 2318 09/11/23 0409   09/10/23 2019  sodium chloride 0.9 % with cefoTEtan (CEFOTAN) ADS Med       Note to Pharmacy: Sherlene Shams: cabinet override      09/10/23 2019 09/10/23 2113   09/10/23 1845  ceFEPIme (MAXIPIME) 2 g in sodium chloride 0.9 % 100 mL IVPB  Status:  Discontinued        2 g 200 mL/hr over 30 Minutes Intravenous Every 8 hours 09/10/23 1840 09/11/23 1148   09/10/23 1830  piperacillin-tazobactam (ZOSYN) IVPB 3.375 g  Status:  Discontinued        3.375 g 100 mL/hr over 30 Minutes Intravenous  Once 09/10/23 1815 09/10/23 1818   09/10/23 1830  ceFEPIme (MAXIPIME) 2 g in sodium chloride 0.9 % 100 mL IVPB  Status:  Discontinued        2 g 200 mL/hr over 30 Minutes Intravenous  Once 09/10/23 1818 09/10/23 1840   09/10/23 1830  metroNIDAZOLE (FLAGYL) IVPB 500 mg        500 mg 100 mL/hr over 60  Minutes Intravenous  Once 09/10/23 1818 09/10/23 1955       Subjective: C/o chronic back pain  Daughter at bedside  Objective: Vitals:   09/13/23 0724 09/13/23 0746 09/13/23 0808 09/13/23 1205  BP: 106/62   121/75  Pulse: 99 (!) 101  (!) 117  Resp: 19 (!) 24  19  Temp: 97.7 F (36.5 C)  98.4 F (36.9 C) 98.5 F (36.9 C)  TempSrc: Oral  Oral Oral  SpO2: 92% 92%  (!) 85%  Weight:      Height:        Intake/Output Summary (Last 24 hours) at 09/13/2023 1536 Last data filed at 09/13/2023 0557 Gross per 24 hour  Intake 1758.78 ml  Output 650 ml  Net 1108.78 ml   [employer]  09/10/23 1617 09/11/23 0600 09/12/23 0443  Weight: 68 kg 65.3 kg 66.1 kg    Examination:  General exam: Appears calm and comfortable  Respiratory system: diminished Cardiovascular system:RRR Gastrointestinal system: distended, dressing intact Central nervous system: Alert and oriented. No focal neurological deficits. Extremities: no LEE   Data Reviewed: I have personally reviewed following labs and imaging studies  CBC: Recent Labs  Lab 09/10/23 1640 09/10/23 2350 09/11/23 0011 09/11/23 0329 09/12/23 0949  WBC 16.1*  --  24.5* 26.8* 24.6*  NEUTROABS 14.0*  --   --   --   --   HGB 12.2* 11.9* 11.5* 11.7* 9.6*  HCT 39.3 35.0* 37.2* 37.1* 30.6*  MCV 100.3*  --  99.5 99.5 100.0  PLT 617*  --  585* 541* 499*    Basic Metabolic Panel: Recent Labs  Lab 09/11/23 0011 09/11/23 0329 09/12/23 0750 09/12/23 0949 09/13/23 0514  NA 141 139 135 141 141  K 4.0 4.4 4.8 3.8 3.5  CL 94* 98 99 102 105  CO2 31 30 30 30 31   GLUCOSE 112* 107* 416* 111* 120*  BUN 28* 27* 23 23 23   CREATININE 0.89 0.78 0.71 0.74 0.57*  CALCIUM 9.2 8.6* 8.5* 8.5* 8.6*  MG  --  1.7 2.4 2.3 2.1  PHOS  --  3.5 4.6 2.7 2.3*    GFR: Estimated Creatinine Clearance: 71.1 mL/min (Vivian Neuwirth) (by C-G formula based on SCr of 0.57 mg/dL (L)).  Liver Function Tests: Recent Labs  Lab 09/10/23 1640 09/11/23 0329  AST 22  27  ALT 18 17  ALKPHOS 185* 117  BILITOT 0.9 0.7  PROT 8.0 5.9*  ALBUMIN 2.8* 1.8*    CBG: Recent Labs  Lab 09/12/23 1548 09/12/23 2104 09/13/23 0035 09/13/23 0633 09/13/23 1145  GLUCAP 100* 111* 114* 106* 118*     Recent Results (from the past 240 hour(s))  Blood culture (routine x 2)     Status: None (Preliminary result)   Collection Time: 09/10/23  6:09 PM   Specimen: Left Antecubital; Blood  Result Value Ref Range Status   Specimen Description LEFT ANTECUBITAL BLOOD  Final   Special Requests   Final    BOTTLES DRAWN AEROBIC AND ANAEROBIC Blood Culture results may not be optimal due to an excessive volume of blood received in culture bottles   Culture   Final    NO GROWTH 3 DAYS Performed at [facility], [address], [place], [place] [address]    Report Status PENDING  Incomplete  Blood culture (routine x 2)     Status: None (Preliminary result)   Collection Time: 09/10/23  6:22 PM   Specimen: BLOOD LEFT HAND  Result Value Ref Range Status   Specimen Description BLOOD LEFT HAND BLOOD  Final   Special Requests   Final    BOTTLES DRAWN AEROBIC AND ANAEROBIC Blood Culture results may not be optimal due to an excessive volume of blood received in culture bottles   Culture   Final    NO GROWTH 3 DAYS Performed at [facility], [address] [place], [place] [address]    Report Status PENDING  Incomplete  SARS Coronavirus 2 by RT PCR (hospital order, performed in [facility] hospital lab) *cepheid single result test*     Status: None   Collection Time: 09/10/23  7:48 PM  Result Value Ref Range Status   SARS Coronavirus 2 by RT PCR NEGATIVE NEGATIVE Final    Comment: (NOTE) SARS-CoV-2 target nucleic acids are NOT DETECTED.  The SARS-CoV-2 RNA is generally detectable in upper and lower respiratory specimens during the acute phase of infection. The lowest concentration of SARS-CoV-2 viral copies this assay can detect is 250 copies / mL. Javier Gell negative result does not  preclude SARS-CoV-2 infection and should not be used as the sole basis for treatment or other patient management decisions.  Tashawna Thom negative result may occur with improper specimen collection / handling, submission of specimen other than nasopharyngeal swab, presence of viral mutation(s) within the areas targeted by this assay, and inadequate number of viral copies (<250 copies / mL). Demario Faniel negative result must be combined with clinical observations, patient history, and epidemiological information.  Fact Sheet for Patients:   RoadLapTop.co.za  Fact Sheet for Healthcare Providers: http://kim-miller.com/  This test is not yet approved or  cleared by the [place] FDA and has been authorized for detection and/or diagnosis of SARS-CoV-2 by FDA under an Emergency Use Authorization (EUA).  This EUA will remain in effect (meaning this test can be used) for the duration of the COVID-19 declaration under Section 564(b)(1) of the Act, 21 U.S.C. section 360bbb-3(b)(1), unless the authorization is terminated or revoked sooner.  Performed at [facility], [address], [place], [place] [address]   Aerobic/Anaerobic Culture w Gram Stain (surgical/deep wound)     Status: None (Preliminary result)   Collection Time: 09/10/23  8:56 PM   Specimen: Path fluid; Tissue  Result Value Ref Range Status   Specimen Description   Final    FLUID Performed at [facility], [address], [place], [place] [address]    Special Requests   Final    NONE Performed at [facility], [address], [place], [place] [address]    Gram Stain   Final    NO WBC SEEN RARE YEAST Performed at [facility], [address] [place], [place] [address]    Culture   Final    RARE CANDIDA ALBICANS NO ANAEROBES ISOLATED; CULTURE IN PROGRESS FOR 5 DAYS    Report Status PENDING  Incomplete  MRSA Next Gen by PCR, Nasal     Status: None   Collection Time: 09/10/23 11:17 PM   Result Value Ref Range Status   MRSA by PCR Next Gen NOT DETECTED NOT DETECTED Final    Comment: (NOTE) The GeneXpert MRSA Assay (FDA approved for NASAL specimens only), is one component of Westen Dinino comprehensive MRSA colonization surveillance program. It is not intended to diagnose MRSA infection nor to guide or monitor treatment for MRSA infections. Test performance is not FDA approved in patients less than 42 years old. Performed at [facility], [address] [place], [place] [address]          Radiology Studies: No results found.      Scheduled Meds:  arformoterol  15 mcg Nebulization Q12H   bisacodyl  10 mg Rectal Daily   budesonide (PULMICORT) nebulizer solution  0.5 mg Nebulization BID   Chlorhexidine Gluconate Cloth  6 each Topical Daily   Gerhardt's butt cream   Topical TID   ipratropium-albuterol  3 mL Nebulization Once   leptospermum manuka honey  1 Application Topical Daily   lidocaine  3 patch Transdermal Q24H   pantoprazole (PROTONIX) IV  40 mg Intravenous Q12H   revefenacin  175 mcg Nebulization Daily   sodium chloride flush  10-40 mL Intracatheter Q12H   thiamine (VITAMIN B1) injection  100 mg Intravenous Daily   Continuous Infusions:  ampicillin-sulbactam (UNASYN) IV 3 g (  09/13/23 1356)   TPN (CLINIMIX-E) Adult     And   fat emul(SMOFlipid)     fluconazole (DIFLUCAN) IV 400 mg (09/12/23 2338)   metronidazole 500 mg (09/13/23 1122)   potassium PHOSPHATE IVPB (in mmol) 15 mmol (09/13/23 1116)   TPN (CLINIMIX-E) Adult 82 mL/hr at 09/13/23 0553     LOS: 3 days    Time spent: over 30 min    Lacretia Nicks, MD [facility]   To contact the attending provider between 7A-7P or the covering provider during after hours 7P-7A, please log into the web site [email or web address] and access using universal [place] password for that web site. If you do not have the password, please call the hospital operator.  09/13/2023, 3:36 PM

## 2023-09-13 NOTE — TOC Progression Note (Signed)
Transition of Care [facility]) - Progression Note    Patient Details  Name: CHAO BLAZEJEWSKI MRN: [id number] Date of Birth: [birth year]  Transition of [facility] CM/SW Contact  Delilah Shan, LCSWA Phone Number: 09/13/2023, 5:07 PM  Clinical Narrative:     CSW followed up on consult received. Assessment complete. CSW made APS report with [facility].  Expected Discharge Plan: Home w Home Health Services Barriers to Discharge: Continued Medical Work up  Expected Discharge Plan and Services In-house Referral: Clinical Social Work Discharge Planning Services: CM Consult Post Acute Care Choice: Home Health Living arrangements for the past 2 months: Single Family Home                                       Social Determinants of Health (SDOH) Interventions SDOH Screenings   Tobacco Use: High Risk (09/10/2023)    Readmission Risk Interventions     No data to display

## 2023-09-13 NOTE — Plan of Care (Signed)
  Problem: Health Behavior/Discharge Planning: Goal: Ability to manage health-related needs will improve Outcome: Progressing   Problem: Clinical Measurements: Goal: Ability to maintain clinical measurements within normal limits will improve Outcome: Progressing Goal: Respiratory complications will improve Outcome: Progressing Goal: Cardiovascular complication will be avoided Outcome: Progressing   Problem: Pain Managment: Goal: General experience of comfort will improve Outcome: Progressing   Problem: Safety: Goal: Ability to remain free from injury will improve Outcome: Progressing   Problem: Metabolic: Goal: Ability to maintain appropriate glucose levels will improve Outcome: Progressing   Problem: Tissue Perfusion: Goal: Adequacy of tissue perfusion will improve Outcome: Progressing

## 2023-09-13 NOTE — Progress Notes (Signed)
PHARMACY - TOTAL PARENTERAL NUTRITION CONSULT NOTE   Indication:  Gastric Ulcer Perforation  Patient Measurements: Height: 5\' 9"  (175.3 cm) Weight: 66.1 kg (145 lb 11.6 oz) IBW/kg (Calculated) : 70.7 TPN AdjBW (KG): 68 Body mass index is 21.52 kg/m. Usual Weight: 180 lbs.  Assessment:  60 YOM admitted for perforation of gastric ulcer after presenting (10/13) with several days of abdominal pain and distention which prevented him from eating or drinking except for small sips of water. Underwent exploratory laparotomy, primary repair of gastric ulcer (1.5 cm), biopsy, and graham patch at [facility] (10/13). PMHx significant for chronic constipation and thinning of abdominal wall with abdominal distention, COPD on home O2. Unable to speak with patient due to intubation.  Presumed moderate-to-high refeeding risk given temporal wasting per MD. Plan for NPO for at least 5 days post-op per team. Pharmacy consulted for TPN.  Unable to make customized TPN and utilizing Clinimix 8/10 due to IVF shortage caused by [facility] per hospital policy. Will not be able to meet full nutrition goals due to limited stock and fluid restrictions. Will replace electrolytes and give lipids outside of Clinimix.   10/16: Lytes trending down steadily, potentially refeeding, will continue to monitor.  Glucose / Insulin: No Hx of DM, A1c unknown, BG < 120 - no insulin Electrolytes: Phos down 2.3, CoCa 10.36, others wnl Renal: Scr 0.78, BUN 27 (trending down) Hepatic (last labs 10/14): Albumin 1.8, AlkPhos/AST/ALT/ Tbili wnl, TG 63 Intake / Output; MIVF: UOP 0.5 mL/kg/hr, NGT 10ml, JP drain 10 mL, LBM 1 month ago per patient  GI Imaging: 10/13 CT Abdomen/Pelvis: free air concerning for gastric or duodenal ulcer, likely acute cholecystitis  10/13 KUB: nonobstructive bowel gas GI Surgeries/Procedures:  10/13: Ex-lap, graham patch, JP drain and NGT placement 10/14: Extubated, propofol off  Central access: PICC  10/14 TPN start date: 10/14  Nutritional Goals: Clinimix E 8/10 1000 mL/day MW @ 42 mL/hr (provides 80 g protein and 664 kcal per day)  Clinimix E 8/10 2000 mL/day TTFriSatSun @ 82 mL/hr (provides 160 g protein and 1325 kcal per day) To provide an average of 137 g AA and 1586 Kcal per day.   RD Assessment:  Estimated Needs Total Energy Estimated Needs: 1900-2100 kcals Total Protein Estimated Needs: 110-125 g Total Fluid Estimated Needs: >/= 1.8 L  Current Nutrition:  TPN NPO  Plan:  Continue Clinimix E 8/10 at 1800: Will alternate: - per day (42 ml/hr) on M/Th - per day (103ml/hr) on Tu/Wed/Fri/Sat/Sun - Decrease Smof lipid to 250 ml daily To provide an average of 137 g AA and 1586 Kcal per day, meeting 100% of protein and ~83% of Kcal goals; lipids provide 28% of kcals. Unable to meet full nutrition requirements due to Clinimix and volume limitations.   Electrolytes:  - 1000 mL per day: Na 35 mEq, K 30 mEq, Mag 5 mEq, Phos 15 mmol, Acetate 83 mEq, Cl 76 mEq in Clinimix - 2000 mL per day: Na 70 mEq, K 60 mEq, Mag 10 mEq, Phos 30 mmol, Acetate 166 mEq, Cl 152 mEq in Clinimix Additional: - K Phos 15 mmol IV (~22 mEq K) Add to clinimix standard MVI and trace elements, no chromium due to shortage Remove sSSI and BG checks Additional IVF per team Monitor TPN labs daily while on Clinimix F/u UGI planned for 10/18 per surgery  Laqueta Jean PharmD Candidate 09/13/2023 8:20 AM

## 2023-09-13 NOTE — Progress Notes (Signed)
Progress Note  3 Days Post-Op  Subjective: Had 1 BM yesterday.  Reports that he is thirsty but otherwise denies new complaints.  Path from OSH  pending.   Objective: Vital signs in last 24 hours: Temp:  [97.7 F (36.5 C)-98.4 F (36.9 C)] 98.4 F (36.9 C) (10/16 0808) Pulse Rate:  [81-107] 101 (10/16 0746) Resp:  [18-27] 24 (10/16 0746) BP: (88-109)/(46-66) 106/62 (10/16 0724) SpO2:  [91 %-98 %] 92 % (10/16 0746) FiO2 (%):  [32 %] 32 % (10/16 0746) Last BM Date :  (awaiting dm from suppository)  Intake/Output from previous day: 10/15 0701 - 10/16 0700 In: 2094.8 [I.V.:1507.9; IV Piggyback:586.9] Out: 820 [Urine:800; Emesis/NG output:10; Drains:10] Intake/Output this shift: No intake/output data recorded.  PE: General: pleasant, WD, chronically ill appearing elderly  male who is laying in bed in NAD Heart: regular, rate, and rhythm Lungs: Respiratory effort nonlabored on [place] Abd: soft, appropriately ttp, dressing C/D/I, JP with serous output, NGT with minimal clear fluid    Lab Results:  Recent Labs    09/11/23 0329 09/12/23 0949  WBC 26.8* 24.6*  HGB 11.7* 9.6*  HCT 37.1* 30.6*  PLT 541* 499*   BMET Recent Labs    09/12/23 0949 09/13/23 0514  NA 141 141  K 3.8 3.5  CL 102 105  CO2 30 31  GLUCOSE 111* 120*  BUN 23 23  CREATININE 0.74 0.57*  CALCIUM 8.5* 8.6*   PT/INR Recent Labs    09/11/23 0011  LABPROT 15.0  INR 1.2   CMP     Component Value Date/Time   NA 141 09/13/2023 0514   NA 149 (H) 02/07/2022 1604   K 3.5 09/13/2023 0514   CL 105 09/13/2023 0514   CO2 31 09/13/2023 0514   GLUCOSE 120 (H) 09/13/2023 0514   BUN 23 09/13/2023 0514   BUN 19 02/07/2022 1604   CREATININE 0.57 (L) 09/13/2023 0514   CALCIUM 8.6 (L) 09/13/2023 0514   PROT 5.9 (L) 09/11/2023 0329   ALBUMIN 1.8 (L) 09/11/2023 0329   AST 27 09/11/2023 0329   ALT 17 09/11/2023 0329   ALKPHOS 117 09/11/2023 0329   BILITOT 0.7 09/11/2023 0329   GFRNONAA >60 09/13/2023  0514   GFRAA >60 01/01/2017 0919   Lipase     Component Value Date/Time   LIPASE 20 09/10/2023 1640       Studies/Results: [place] EKG SITE RITE  Result Date: 09/11/2023 If Site Rite image not attached, placement could not be confirmed due to current cardiac rhythm.   Anti-infectives: Anti-infectives (From admission, onward)    Start     Dose/Rate Route Frequency Ordered Stop   09/11/23 2345  fluconazole (DIFLUCAN) IVPB 400 mg       Note to Pharmacy: Attention pharmacist: For 800 mg doses, change infusion duration to 240 min in verifcation via edit clinical information.  Placed in "Followed by" Linked Group   400 mg 100 mL/hr over 120 Minutes Intravenous Every 24 hours 09/10/23 2318     09/11/23 1245  metroNIDAZOLE (FLAGYL) IVPB 500 mg        500 mg 100 mL/hr over 60 Minutes Intravenous Every 12 hours 09/11/23 1148     09/11/23 1245  Ampicillin-Sulbactam (UNASYN) 3 g in sodium chloride 0.9 % 100 mL IVPB        3 g 200 mL/hr over 30 Minutes Intravenous Every 6 hours 09/11/23 1148     09/11/23 0600  cefoTEtan (CEFOTAN) 2 g in sodium chloride 0.9 %  100 mL IVPB        2 g 200 mL/hr over 30 Minutes Intravenous On call to O.R. 09/10/23 2007 09/10/23 2127   09/11/23 0000  azithromycin (ZITHROMAX) 500 mg in sodium chloride 0.9 % 250 mL IVPB  Status:  Discontinued        500 mg 250 mL/hr over 60 Minutes Intravenous Every 24 hours 09/10/23 2330 09/11/23 1148   09/10/23 2330  fluconazole (DIFLUCAN) IVPB 800 mg       Note to Pharmacy: Attention pharmacist: For 800 mg doses, change infusion duration to 240 min in verifcation via edit clinical information.  Placed in "Followed by" Linked Group   800 mg 100 mL/hr over 240 Minutes Intravenous  Once 09/10/23 2318 09/11/23 0409   09/10/23 2019  sodium chloride 0.9 % with cefoTEtan (CEFOTAN) ADS Med       Note to Pharmacy: Sherlene Shams: cabinet override      09/10/23 2019 09/10/23 2113   09/10/23 1845  ceFEPIme (MAXIPIME) 2 g in sodium  chloride 0.9 % 100 mL IVPB  Status:  Discontinued        2 g 200 mL/hr over 30 Minutes Intravenous Every 8 hours 09/10/23 1840 09/11/23 1148   09/10/23 1830  piperacillin-tazobactam (ZOSYN) IVPB 3.375 g  Status:  Discontinued        3.375 g 100 mL/hr over 30 Minutes Intravenous  Once 09/10/23 1815 09/10/23 1818   09/10/23 1830  ceFEPIme (MAXIPIME) 2 g in sodium chloride 0.9 % 100 mL IVPB  Status:  Discontinued        2 g 200 mL/hr over 30 Minutes Intravenous  Once 09/10/23 1818 09/10/23 1840   09/10/23 1830  metroNIDAZOLE (FLAGYL) IVPB 500 mg        500 mg 100 mL/hr over 60 Minutes Intravenous  Once 09/10/23 1818 09/10/23 1955        Assessment/Plan Perforated gastric ulcer  POD3 s/p ex-lap with graham patch 10/13 Dr. 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    Moise Boring, MD [facility] 09/13/2023, 8:11 AM Please see Amion for pager number during day hours 7:00am-4:30pm

## 2023-09-13 NOTE — Care Management Important Message (Signed)
Important Message  Patient Details  Name: EMANUEL DOWSON MRN: [id number] Date of Birth: 01-08-1946   Important Message Given:  Yes - Medicare IM     Sherilyn Banker 09/13/2023, 4:35 PM

## 2023-09-13 NOTE — Progress Notes (Signed)
eLink Physician-Brief Progress Note Patient Name: Corey Griffin DOB: [birth year] MRN: [id number]   Date of Service  09/13/2023  HPI/Events of Note  Patient refusing Fentanyl.   Patient asking for IV tylenol  eICU Interventions  IV Tylenol reordered x 1     Intervention Category Intermediate Interventions: Pain - evaluation and management  Darl Pikes 09/13/2023, 12:30 AM

## 2023-09-14 ENCOUNTER — Inpatient Hospital Stay (HOSPITAL_COMMUNITY): Payer: 59

## 2023-09-14 DIAGNOSIS — K251 Acute gastric ulcer with perforation: Secondary | ICD-10-CM | POA: Diagnosis not present

## 2023-09-14 LAB — COMPREHENSIVE METABOLIC PANEL
ALT: 16 U/L (ref 0–44)
AST: 19 U/L (ref 15–41)
Albumin: <1.5 g/dL — ABNORMAL LOW (ref 3.5–5.0)
Alkaline Phosphatase: 111 U/L (ref 38–126)
Anion gap: 6 (ref 5–15)
BUN: 26 mg/dL — ABNORMAL HIGH (ref 8–23)
CO2: 28 mmol/L (ref 22–32)
Calcium: 8.7 mg/dL — ABNORMAL LOW (ref 8.9–10.3)
Chloride: 107 mmol/L (ref 98–111)
Creatinine, Ser: 0.6 mg/dL — ABNORMAL LOW (ref 0.61–1.24)
GFR, Estimated: >60 mL/min (ref >60)
Glucose, Bld: 115 mg/dL — ABNORMAL HIGH (ref 70–99)
Potassium: 3.8 mmol/L (ref 3.5–5.1)
Sodium: 141 mmol/L (ref 135–145)
Total Bilirubin: 0.1 mg/dL — ABNORMAL LOW (ref 0.3–1.2)
Total Protein: 5.3 g/dL — ABNORMAL LOW (ref 6.5–8.1)

## 2023-09-14 LAB — CBC WITH DIFFERENTIAL/PLATELET
Abs Immature Granulocytes: 0 10*3/uL (ref 0.00–0.07)
Basophils Absolute: 0 10*3/uL (ref 0.0–0.1)
Basophils Relative: 0 %
Eosinophils Absolute: 0.5 10*3/uL (ref 0.0–0.5)
Eosinophils Relative: 2 %
HCT: 30.8 % — ABNORMAL LOW (ref 39.0–52.0)
Hemoglobin: 9.4 g/dL — ABNORMAL LOW (ref 13.0–17.0)
Lymphocytes Relative: 1 %
Lymphs Abs: 0.3 10*3/uL — ABNORMAL LOW (ref 0.7–4.0)
MCH: 30.7 pg (ref 26.0–34.0)
MCHC: 30.5 g/dL (ref 30.0–36.0)
MCV: 100.7 fL — ABNORMAL HIGH (ref 80.0–100.0)
Monocytes Absolute: 2.1 10*3/uL — ABNORMAL HIGH (ref 0.1–1.0)
Monocytes Relative: 8 %
Neutro Abs: 23.1 10*3/uL — ABNORMAL HIGH (ref 1.7–7.7)
Neutrophils Relative %: 89 %
Platelets: 503 10*3/uL — ABNORMAL HIGH (ref 150–400)
RBC: 3.06 MIL/uL — ABNORMAL LOW (ref 4.22–5.81)
RDW: 16.6 % — ABNORMAL HIGH (ref 11.5–15.5)
WBC: 26 10*3/uL — ABNORMAL HIGH (ref 4.0–10.5)
nRBC: 0 % (ref 0.0–0.2)
nRBC: 0 /100{WBCs}

## 2023-09-14 LAB — MAGNESIUM: Magnesium: 1.8 mg/dL (ref 1.7–2.4)

## 2023-09-14 LAB — GLUCOSE, CAPILLARY
Glucose-Capillary: 117 mg/dL — ABNORMAL HIGH (ref 70–99)
Glucose-Capillary: 121 mg/dL — ABNORMAL HIGH (ref 70–99)
Glucose-Capillary: 119 mg/dL — ABNORMAL HIGH (ref 70–99)

## 2023-09-14 LAB — PHOSPHORUS: Phosphorus: 2.6 mg/dL (ref 2.5–4.6)

## 2023-09-14 MED ORDER — IOHEXOL 300 MG/ML  SOLN
100.0000 mL | Freq: Once | INTRAMUSCULAR | Status: AC | PRN
  Administered 2023-09-14: 100 mL

## 2023-09-14 MED ORDER — FUROSEMIDE 10 MG/ML IJ SOLN
60.0000 mg | Freq: Once | INTRAMUSCULAR | Status: AC
  Administered 2023-09-14: 60 mg via INTRAVENOUS
  Filled 2023-09-14: qty 6

## 2023-09-14 MED ORDER — FAT EMUL FISH OIL/PLANT BASED 20% (SMOFLIPID)IV EMUL
250.0000 mL | INTRAVENOUS | Status: AC
  Administered 2023-09-14: 250 mL via INTRAVENOUS
  Filled 2023-09-14: qty 250

## 2023-09-14 MED ORDER — ALUM & MAG HYDROXIDE-SIMETH 200-200-20 MG/5ML PO SUSP
15.0000 mL | Freq: Four times a day (QID) | ORAL | Status: DC | PRN
  Administered 2023-09-15: 15 mL via ORAL
  Filled 2023-09-14: qty 30

## 2023-09-14 MED ORDER — MAGNESIUM SULFATE 2 GM/50ML IV SOLN
2.0000 g | Freq: Once | INTRAVENOUS | Status: AC
  Administered 2023-09-14: 2 g via INTRAVENOUS
  Filled 2023-09-14: qty 50

## 2023-09-14 MED ORDER — BOOST / RESOURCE BREEZE PO LIQD CUSTOM
1.0000 | Freq: Three times a day (TID) | ORAL | Status: DC
  Administered 2023-09-14: 1 via ORAL

## 2023-09-14 MED ORDER — TRACE MINERALS CU-MN-SE-ZN 300-55-60-3000 MCG/ML IV SOLN
INTRAVENOUS | Status: AC
  Filled 2023-09-14 (×2): qty 1000

## 2023-09-14 NOTE — Progress Notes (Signed)
PROGRESS NOTE    CHAUN UEMURA  KVQ:[id number] DOB: [birth year] DOA: 09/10/2023 PCP: Benita Stabile, MD  Chief Complaint  Patient presents with   bowe blockage    Brief Narrative:   Patient is Corey Griffin  77 yo M w/ pertinent PMH COPD on o2(seen by Dr. Sherene Sires), chronic pain, chronic constipation presents to [facility] ED on 10/13 w/ abd distention and pain.   Patient states he has had abd distention, constipation, and pain for several days. Uanble to eat or drink due to the pain. Denies any vomiting or diarrhea. Has hx of severe abd wall thinning. Concerned he has Lan Mcneill blockage and came to [facility] on 10/13 for further workup. On arrival patient tachycardic 110s, afebrile, bp stable. Abd distended w/ pain on palpation. WBC 16. Cultures obtained and started on cefepime/flagyl. UA negative. Lipase wnl. CT abd/pelvis showing free air in stomach excision for perforated gastric ulcer. Surgery consulted. Taken to OR for ex lap with gastric ulcer repair, biopsy, graham patch placement. JP drain in place. Given hx of COPD plan to keep post op intubated overnight and transfer to [facility] ICU. PCCM consulted for transfer.  Significant Events 10/13 admit to [facility] for gastric ulcer w/ free air in abd; taking to OR; post op remains intubated transfer to [facility]; pccm consulted 10/14 Extubated TRH assumed care on 10/16  Assessment & Plan:   Principal Problem:   Acute gastric ulcer with perforation (HCC) Active Problems:   Sepsis (HCC)   Bowel perforation (HCC)   Gastric ulcer with perforation (HCC)   Acute respiratory failure (HCC)   Protein-calorie malnutrition, severe  Perforated Gastric Ulcer s/p Ex Lap with Cheree Ditto Patch S/p OR 10/13 with general surgery Appreciate surgery recs - continue NGT, UGI 10/17 without evidence of contrast leak - follow pending path/culture (reactive gastropathy showing activity, ulceration, foveolar hyperplasia, and intestinal metaplasia.  Helicobacter stain negative.  Negative for dysplasia and  carcinoma).  Culture with rare candida albicans.   Continue TPN per surgery Continue unasyn/fluconazole (also on flagyl, can probably be d/c'd - will review with pharmacy/surgery)  Acute Respiratory Insufficiency Aspiration Pneumonia COPD not in Exacerbation  Currently on 3 L [place] Unasyn as above CXR 10/13 with increasing R base atelectasis or infiltrate  Follow, continue brovana, pulmicort, yuperlri and prn albuterol  On spiriva at home Respiratory culture with gram positive cocci in pairs (10/14) Negative MRSA PCR  Leukocytosis Due to above  Rare candida albicans on surgical culture, gram positive cocci in pairs on resp culture BC NGTD (10/13)  Abx as above  Sinus Tachycardia Due to pain, above process  Will trend   Severe Protein Calorie Malnutrition TPN   Hypophosphatemia  Hypomagnesemia  Hypokalemia Replace and follow  Chronic Pain Takes oxy 10 q6 prn at home as needed  BPH  Flomax currently on hold  Stage III decub Appreciate wound care recs Pressure Injury 09/11/23 Buttocks Bilateral Stage 3 -  Full thickness tissue loss. Subcutaneous fat may be visible but bone, tendon or muscle are NOT exposed. red, pink, raw (Active)  09/11/23 1130  Location: Buttocks  Location Orientation: Bilateral  Staging: Stage 3 -  Full thickness tissue loss. Subcutaneous fat may be visible but bone, tendon or muscle are NOT exposed.  Wound Description (Comments): red, pink, raw  Present on Admission: Yes       DVT prophylaxis: SCD Code Status: full Family Communication: daughter at bedside Disposition:   Status is: Inpatient Remains inpatient appropriate because: continued need for inpatient care  Consultants:  Surgery Pccm   Procedures:  10/13  Exploratory laparotomy, primary repair  gastric ulcer (1.5cm), biopsy of ulcer edge, vascularized graham patch (omental patch)   Antimicrobials:  Anti-infectives (From admission, onward)    Start     Dose/Rate Route  Frequency Ordered Stop   09/11/23 2345  fluconazole (DIFLUCAN) IVPB 400 mg       Note to Pharmacy: Attention pharmacist: For 800 mg doses, change infusion duration to 240 min in verifcation via edit clinical information.  Placed in "Followed by" Linked Group   400 mg 100 mL/hr over 120 Minutes Intravenous Every 24 hours 09/10/23 2318     09/11/23 1245  metroNIDAZOLE (FLAGYL) IVPB 500 mg        500 mg 100 mL/hr over 60 Minutes Intravenous Every 12 hours 09/11/23 1148     09/11/23 1245  Ampicillin-Sulbactam (UNASYN) 3 g in sodium chloride 0.9 % 100 mL IVPB        3 g 200 mL/hr over 30 Minutes Intravenous Every 6 hours 09/11/23 1148     09/11/23 0600  cefoTEtan (CEFOTAN) 2 g in sodium chloride 0.9 % 100 mL IVPB        2 g 200 mL/hr over 30 Minutes Intravenous On call to O.R. 09/10/23 2007 09/10/23 2127   09/11/23 0000  azithromycin (ZITHROMAX) 500 mg in sodium chloride 0.9 % 250 mL IVPB  Status:  Discontinued        500 mg 250 mL/hr over 60 Minutes Intravenous Every 24 hours 09/10/23 2330 09/11/23 1148   09/10/23 2330  fluconazole (DIFLUCAN) IVPB 800 mg       Note to Pharmacy: Attention pharmacist: For 800 mg doses, change infusion duration to 240 min in verifcation via edit clinical information.  Placed in "Followed by" Linked Group   800 mg 100 mL/hr over 240 Minutes Intravenous  Once 09/10/23 2318 09/11/23 0409   09/10/23 2019  sodium chloride 0.9 % with cefoTEtan (CEFOTAN) ADS Med       Note to Pharmacy: Sherlene Shams: cabinet override      09/10/23 2019 09/10/23 2113   09/10/23 1845  ceFEPIme (MAXIPIME) 2 g in sodium chloride 0.9 % 100 mL IVPB  Status:  Discontinued        2 g 200 mL/hr over 30 Minutes Intravenous Every 8 hours 09/10/23 1840 09/11/23 1148   09/10/23 1830  piperacillin-tazobactam (ZOSYN) IVPB 3.375 g  Status:  Discontinued        3.375 g 100 mL/hr over 30 Minutes Intravenous  Once 09/10/23 1815 09/10/23 1818   09/10/23 1830  ceFEPIme (MAXIPIME) 2 g in sodium  chloride 0.9 % 100 mL IVPB  Status:  Discontinued        2 g 200 mL/hr over 30 Minutes Intravenous  Once 09/10/23 1818 09/10/23 1840   09/10/23 1830  metroNIDAZOLE (FLAGYL) IVPB 500 mg        500 mg 100 mL/hr over 60 Minutes Intravenous  Once 09/10/23 1818 09/10/23 1955       Subjective: Continued chronic back pain - issues with procedure and getting repositioning  Daughter at bedside  Objective: Vitals:   09/14/23 0710 09/14/23 0712 09/14/23 1028 09/14/23 1151  BP:      Pulse:   (!) 110   Resp:   (!) 30   Temp:  98.5 F (36.9 C)    TempSrc:  Oral  Oral  SpO2: 97%  (!) 87%   Weight:      Height:  Intake/Output Summary (Last 24 hours) at 09/14/2023 1436 Last data filed at 09/14/2023 0840 Gross per 24 hour  Intake --  Output 1640 ml  Net -1640 ml   Filed Weights   09/10/23 1617 09/11/23 0600 09/12/23 0443  Weight: 68 kg 65.3 kg 66.1 kg    Examination:  General: No acute distress. Cardiovascular: tachy, regular Lungs: diminished, unlabored Abdomen: distended, midline abdominal dressing with drain Neurological: Alert and oriented 3. Moves all extremities 4 with equal strength. Cranial nerves II through XII grossly intact. Extremities: No clubbing or cyanosis. No edema.  Data Reviewed: I have personally reviewed following labs and imaging studies  CBC: Recent Labs  Lab 09/10/23 1640 09/10/23 2350 09/11/23 0011 09/11/23 0329 09/12/23 0949 09/14/23 0842  WBC 16.1*  --  24.5* 26.8* 24.6* 26.0*  NEUTROABS 14.0*  --   --   --   --  23.1*  HGB 12.2* 11.9* 11.5* 11.7* 9.6* 9.4*  HCT 39.3 35.0* 37.2* 37.1* 30.6* 30.8*  MCV 100.3*  --  99.5 99.5 100.0 100.7*  PLT 617*  --  585* 541* 499* 503*    Basic Metabolic Panel: Recent Labs  Lab 09/11/23 0329 09/12/23 0750 09/12/23 0949 09/13/23 0514 09/14/23 0842  NA 139 135 141 141 141  K 4.4 4.8 3.8 3.5 3.8  CL 98 99 102 105 107  CO2 30 30 30 31 28   GLUCOSE 107* 416* 111* 120* 115*  BUN 27* 23 23 23   26*  CREATININE 0.78 0.71 0.74 0.57* 0.60*  CALCIUM 8.6* 8.5* 8.5* 8.6* 8.7*  MG 1.7 2.4 2.3 2.1 1.8  PHOS 3.5 4.6 2.7 2.3* 2.6    GFR: Estimated Creatinine Clearance: 71.1 mL/min (Mayia Megill) (by C-G formula based on SCr of 0.6 mg/dL (L)).  Liver Function Tests: Recent Labs  Lab 09/10/23 1640 09/11/23 0329 09/14/23 0842  AST 22 27 19   ALT 18 17 16   ALKPHOS 185* 117 111  BILITOT 0.9 0.7 0.1*  PROT 8.0 5.9* 5.3*  ALBUMIN 2.8* 1.8* <1.5*    CBG: Recent Labs  Lab 09/13/23 1145 09/13/23 1738 09/13/23 2323 09/14/23 0603 09/14/23 1150  GLUCAP 118* 110* 125* 117* 121*     Recent Results (from the past 240 hour(s))  Blood culture (routine x 2)     Status: None (Preliminary result)   Collection Time: 09/10/23  6:09 PM   Specimen: Left Antecubital; Blood  Result Value Ref Range Status   Specimen Description LEFT ANTECUBITAL BLOOD  Final   Special Requests   Final    BOTTLES DRAWN AEROBIC AND ANAEROBIC Blood Culture results may not be optimal due to an excessive volume of blood received in culture bottles   Culture   Final    NO GROWTH 4 DAYS Performed at [facility], [address] [place], [place] [address]    Report Status PENDING  Incomplete  Blood culture (routine x 2)     Status: None (Preliminary result)   Collection Time: 09/10/23  6:22 PM   Specimen: BLOOD LEFT HAND  Result Value Ref Range Status   Specimen Description BLOOD LEFT HAND BLOOD  Final   Special Requests   Final    BOTTLES DRAWN AEROBIC AND ANAEROBIC Blood Culture results may not be optimal due to an excessive volume of blood received in culture bottles   Culture   Final    NO GROWTH 4 DAYS Performed at [facility], [address], [place], [place] [address]    Report Status PENDING  Incomplete  SARS Coronavirus  2 by RT PCR (hospital order, performed in [facility] hospital lab) *cepheid single result test*     Status: None   Collection Time: 09/10/23  7:48 PM  Result Value Ref Range Status   SARS  Coronavirus 2 by RT PCR NEGATIVE NEGATIVE Final    Comment: (NOTE) SARS-CoV-2 target nucleic acids are NOT DETECTED.  The SARS-CoV-2 RNA is generally detectable in upper and lower respiratory specimens during the acute phase of infection. The lowest concentration of SARS-CoV-2 viral copies this assay can detect is 250 copies / mL. Raeshaun Simson negative result does not preclude SARS-CoV-2 infection and should not be used as the sole basis for treatment or other patient management decisions.  Siera Beyersdorf negative result may occur with improper specimen collection / handling, submission of specimen other than nasopharyngeal swab, presence of viral mutation(s) within the areas targeted by this assay, and inadequate number of viral copies (<250 copies / mL). Jlee Harkless negative result must be combined with clinical observations, patient history, and epidemiological information.  Fact Sheet for Patients:   RoadLapTop.co.za  Fact Sheet for Healthcare Providers: http://kim-miller.com/  This test is not yet approved or  cleared by the [place] FDA and has been authorized for detection and/or diagnosis of SARS-CoV-2 by FDA under an Emergency Use Authorization (EUA).  This EUA will remain in effect (meaning this test can be used) for the duration of the COVID-19 declaration under Section 564(b)(1) of the Act, 21 U.S.C. section 360bbb-3(b)(1), unless the authorization is terminated or revoked sooner.  Performed at [facility], [address] [place], [place] [address]   Aerobic/Anaerobic Culture w Gram Stain (surgical/deep wound)     Status: None (Preliminary result)   Collection Time: 09/10/23  8:56 PM   Specimen: Path fluid; Tissue  Result Value Ref Range Status   Specimen Description   Final    FLUID Performed at [facility], [address], [place], [place] [address]    Special Requests   Final    NONE Performed at [facility], [address] [place],  [place] [address]    Gram Stain   Final    NO WBC SEEN RARE YEAST Performed at [facility], [address], [place], [place] [address]    Culture   Final    RARE CANDIDA ALBICANS RARE GLAND NO ANAEROBES ISOLATED; CULTURE IN PROGRESS FOR 5 DAYS    Report Status PENDING  Incomplete  MRSA Next Gen by PCR, Nasal     Status: None   Collection Time: 09/10/23 11:17 PM  Result Value Ref Range Status   MRSA by PCR Next Gen NOT DETECTED NOT DETECTED Final    Comment: (NOTE) The GeneXpert MRSA Assay (FDA approved for NASAL specimens only), is one component of [place] comprehensive MRSA colonization surveillance program. It is not intended to diagnose MRSA infection nor to guide or monitor treatment for MRSA infections. Test performance is not FDA approved in patients less than 67 years old. Performed at [facility], [address], [place], [place] [address]   Culture, Respiratory w Gram Stain     Status: None (Preliminary result)   Collection Time: 09/11/23 12:30 AM   Specimen: Sputum  Result Value Ref Range Status   Specimen Description SPU  Final   Special Requests NONE  Final   Gram Stain   Final    ABUNDANT WBC PRESENT, PREDOMINANTLY PMN RARE GRAM POSITIVE COCCI IN PAIRS    Culture   Final    CULTURE REINCUBATED  FOR BETTER GROWTH Performed at [facility], [address] [place], [place] [address]    Report Status PENDING  Incomplete         Radiology Studies: DG UGI W SINGLE CM (SOL OR THIN BA)  Result Date: 09/14/2023 CLINICAL DATA:  Patient with Kevork Joyce history of perforated gastric ulcer status post exploratory laparotomy and Graham patch. EXAM: DG UGI W SINGLE CM TECHNIQUE: Scout radiograph was obtained. Single contrast examination was performed using water soluble contrast administered via NG tube. Study is limited by the patient's limited mobility. Examination was performed in the supine and right lateral decubitus positions. This exam was performed by Alwyn Ren, NP, and was supervised and interpreted by Dr. Purcell Mouton. FLUOROSCOPY: Radiation Exposure Index (as provided by the fluoroscopic device): 34.30 mGy Kerma COMPARISON:  Radiographs 09/10/2023.  Preoperative CT 09/10/2023. FINDINGS: Scout Radiograph: Enteric tube projects over the mid stomach. There is Delaina Fetsch surgical drain in the right upper quadrant. Gastroesophageal reflux:  None visualized. Stomach: No gastric ulcer or mucosal abnormality identified. No evidence of contrast leak. Gastric emptying: Normal. Duodenum:  Normal appearance. Other: No evidence of contrast leak. IMPRESSION: 1. No evidence of contrast leak following recent surgical repair of Allean Montfort perforated gastric ulcer. 2. No gastric outlet obstruction or focal small bowel abnormality identified. Electronically Signed   By: Carey Bullocks M.D.   On: 09/14/2023 10:39        Scheduled Meds:  arformoterol  15 mcg Nebulization Q12H   bisacodyl  10 mg Rectal Daily   budesonide (PULMICORT) nebulizer solution  0.5 mg Nebulization BID   Chlorhexidine Gluconate Cloth  6 each Topical Daily   Gerhardt's butt cream   Topical TID   ipratropium-albuterol  3 mL Nebulization Once   leptospermum manuka honey  1 Application Topical Daily   lidocaine  3 patch Transdermal Q24H   pantoprazole (PROTONIX) IV  40 mg Intravenous Q12H   revefenacin  175 mcg Nebulization Daily   sodium chloride flush  10-40 mL Intracatheter Q12H   thiamine (VITAMIN B1) injection  100 mg Intravenous Daily   Continuous Infusions:  ampicillin-sulbactam (UNASYN) IV 3 g (09/14/23 1402)   TPN (CLINIMIX-E) Adult     And   fat emul(SMOFlipid)     fluconazole (DIFLUCAN) IV 400 mg (09/14/23 0129)   metronidazole 500 mg (09/14/23 1213)   TPN (CLINIMIX-E) Adult 82 mL/hr at 09/13/23 1749     LOS: 4 days    Time spent: over 30 min    Lacretia Nicks, MD [facility]   To contact the attending provider between 7A-7P or the covering provider during after hours 7P-7A,  please log into the web site [email or web address] and access using universal [place] password for that web site. If you do not have the password, please call the hospital operator.  09/14/2023, 2:36 PM

## 2023-09-14 NOTE — Progress Notes (Signed)
PHARMACY - TOTAL PARENTERAL NUTRITION CONSULT NOTE   Indication:  Gastric Ulcer Perforation  Patient Measurements: Height: 5\' 9"  (175.3 cm) Weight: 66.1 kg (145 lb 11.6 oz) IBW/kg (Calculated) : 70.7 TPN AdjBW (KG): 68 Body mass index is 21.52 kg/m. Usual Weight: 180 lbs.  Assessment:  89 YOM admitted for perforation of gastric ulcer after presenting (10/13) with several days of abdominal pain and distention which prevented him from eating or drinking except for small sips of water. Underwent exploratory laparotomy, primary repair of gastric ulcer (1.5 cm), biopsy, and graham patch at [facility] (10/13). PMHx significant for chronic constipation and thinning of abdominal wall with abdominal distention, COPD on home O2. Unable to speak with patient due to intubation.  Presumed moderate-to-high refeeding risk given temporal wasting per MD. Plan for NPO for at least 5 days post-op per team. Pharmacy consulted for TPN.  Unable to make customized TPN and utilizing Clinimix 8/10 due to IVF shortage caused by [facility] per hospital policy. Will not be able to meet full nutrition goals due to limited stock and fluid restrictions. Will replace electrolytes and give lipids outside of Clinimix.   10/16: Lytes trending down steadily, potentially refeeding, will continue to monitor.  Glucose / Insulin: No Hx of DM, A1c unknown, BG < 120 - no insulin Electrolytes: Phos up 2.6 (received 45 mmol), CoCa 10.7, others wnl Renal: Scr 0.60, BUN 26 (trending down) Hepatic: Albumin < 1.5, Tbili 0.1, AlkPhos/AST/ALT wnl, TG 63 Intake / Output; MIVF: UOP 0.4 mL/kg/hr, NGT output low, JP drain 40 mL, stool x1 (unmeasured)  GI Imaging: 10/13 CT Abdomen/Pelvis: free air concerning for gastric or duodenal ulcer, likely acute cholecystitis  10/13 KUB: nonobstructive bowel gas 10/17 UGI: no contrast leak, no identifiable obstruction or small bowel abnormality  GI Surgeries/Procedures:  10/13: Ex-lap, graham  patch, JP drain and NGT placement 10/14: Extubated, propofol off  Central access: PICC 10/14 TPN start date: 10/14  Nutritional Goals: Clinimix E 8/10 1000 mL/day MW @ 42 mL/hr (provides 80 g protein and 664 kcal per day)  Clinimix E 8/10 2000 mL/day TTFriSatSun @ 82 mL/hr (provides 160 g protein and 1325 kcal per day) To provide an average of 137 g AA and 1586 Kcal per day.   RD Assessment:  Estimated Needs Total Energy Estimated Needs: 1900-2100 kcals Total Protein Estimated Needs: 110-125 g Total Fluid Estimated Needs: >/= 1.8 L  Current Nutrition:  TPN NPO  Plan:  Continue Clinimix E 8/10 at 1800: Will alternate: - per day (42 ml/hr) on M/Th - per day (35ml/hr) on Tu/Wed/Fri/Sat/Sun - Smof lipid to 250 ml daily To provide an average of 137 g AA and 1586 Kcal per day, meeting 100% of protein and ~83% of Kcal goals; lipids provide 28% of kcals. Unable to meet full nutrition requirements due to Clinimix and volume limitations.   Electrolytes:  - 1000 mL per day: Na 35 mEq, K 30 mEq, Mag 5 mEq, Phos 15 mmol, Acetate 83 mEq, Cl 76 mEq in Clinimix - 2000 mL per day: Na 70 mEq, K 60 mEq, Mag 10 mEq, Phos 30 mmol, Acetate 166 mEq, Cl 152 mEq in Clinimix Additional: - Mag 2 g IV Add to clinimix standard MVI and trace elements, no chromium due to shortage Additional IVF per team Monitor TPN labs daily while on Clinimix F/u clear liquid diet trial given results of UGI  Laqueta Jean PharmD Candidate 09/14/2023 10:29 AM

## 2023-09-14 NOTE — Progress Notes (Signed)
Progress Note  4 Days Post-Op  Subjective: Endorsing frustration with thirst and back pain.  Abdomen feels less distended after Bms.    Objective: Vital signs in last 24 hours: Temp:  [98.5 F (36.9 C)-99.3 F (37.4 C)] 98.5 F (36.9 C) (10/17 0712) Pulse Rate:  [89-124] 105 (10/17 0558) Resp:  [18-31] 23 (10/17 0558) BP: (103-124)/(63-75) 111/69 (10/17 0558) SpO2:  [85 %-97 %] 97 % (10/17 0710) Last BM Date :  (awaiting dm from suppository)  Intake/Output from previous day: 10/16 0701 - 10/17 0700 In: -  Out: 640 [Urine:600; Drains:40] Intake/Output this shift: Total I/O In: -  Out: 1000 [Urine:1000]  PE: General: pleasant, WD, chronically ill appearing elderly  male who is laying in bed in NAD Heart: regular, rate, and rhythm Lungs: Respiratory effort nonlabored on [place] Abd: soft, appropriately ttp, dressing C/D/I, JP with serous output, NGT with minimal clear fluid    Lab Results:  Recent Labs    09/12/23 0949  WBC 24.6*  HGB 9.6*  HCT 30.6*  PLT 499*   BMET Recent Labs    09/12/23 0949 09/13/23 0514  NA 141 141  K 3.8 3.5  CL 102 105  CO2 30 31  GLUCOSE 111* 120*  BUN 23 23  CREATININE 0.74 0.57*  CALCIUM 8.5* 8.6*   PT/INR No results for input(s): "LABPROT", "INR" in the last 72 hours.  CMP     Component Value Date/Time   NA 141 09/13/2023 0514   NA 149 (H) 02/07/2022 1604   K 3.5 09/13/2023 0514   CL 105 09/13/2023 0514   CO2 31 09/13/2023 0514   GLUCOSE 120 (H) 09/13/2023 0514   BUN 23 09/13/2023 0514   BUN 19 02/07/2022 1604   CREATININE 0.57 (L) 09/13/2023 0514   CALCIUM 8.6 (L) 09/13/2023 0514   PROT 5.9 (L) 09/11/2023 0329   ALBUMIN 1.8 (L) 09/11/2023 0329   AST 27 09/11/2023 0329   ALT 17 09/11/2023 0329   ALKPHOS 117 09/11/2023 0329   BILITOT 0.7 09/11/2023 0329   GFRNONAA >60 09/13/2023 0514   GFRAA >60 01/01/2017 0919   Lipase     Component Value Date/Time   LIPASE 20 09/10/2023 1640       Studies/Results: No  results found.  Anti-infectives: Anti-infectives (From admission, onward)    Start     Dose/Rate Route Frequency Ordered Stop   09/11/23 2345  fluconazole (DIFLUCAN) IVPB 400 mg       Note to Pharmacy: Attention pharmacist: For 800 mg doses, change infusion duration to 240 min in verifcation via edit clinical information.  Placed in "Followed by" Linked Group   400 mg 100 mL/hr over 120 Minutes Intravenous Every 24 hours 09/10/23 2318     09/11/23 1245  metroNIDAZOLE (FLAGYL) IVPB 500 mg        500 mg 100 mL/hr over 60 Minutes Intravenous Every 12 hours 09/11/23 1148     09/11/23 1245  Ampicillin-Sulbactam (UNASYN) 3 g in sodium chloride 0.9 % 100 mL IVPB        3 g 200 mL/hr over 30 Minutes Intravenous Every 6 hours 09/11/23 1148     09/11/23 0600  cefoTEtan (CEFOTAN) 2 g in sodium chloride 0.9 % 100 mL IVPB        2 g 200 mL/hr over 30 Minutes Intravenous On call to O.R. 09/10/23 2007 09/10/23 2127   09/11/23 0000  azithromycin (ZITHROMAX) 500 mg in sodium chloride 0.9 % 250 mL IVPB  Status:  Discontinued        500 mg 250 mL/hr over 60 Minutes Intravenous Every 24 hours 09/10/23 2330 09/11/23 1148   09/10/23 2330  fluconazole (DIFLUCAN) IVPB 800 mg       Note to Pharmacy: Attention pharmacist: For 800 mg doses, change infusion duration to 240 min in verifcation via edit clinical information.  Placed in "Followed by" Linked Group   800 mg 100 mL/hr over 240 Minutes Intravenous  Once 09/10/23 2318 09/11/23 0409   09/10/23 2019  sodium chloride 0.9 % with cefoTEtan (CEFOTAN) ADS Med       Note to Pharmacy: Sherlene Shams: cabinet override      09/10/23 2019 09/10/23 2113   09/10/23 1845  ceFEPIme (MAXIPIME) 2 g in sodium chloride 0.9 % 100 mL IVPB  Status:  Discontinued        2 g 200 mL/hr over 30 Minutes Intravenous Every 8 hours 09/10/23 1840 09/11/23 1148   09/10/23 1830  piperacillin-tazobactam (ZOSYN) IVPB 3.375 g  Status:  Discontinued        3.375 g 100 mL/hr over 30  Minutes Intravenous  Once 09/10/23 1815 09/10/23 1818   09/10/23 1830  ceFEPIme (MAXIPIME) 2 g in sodium chloride 0.9 % 100 mL IVPB  Status:  Discontinued        2 g 200 mL/hr over 30 Minutes Intravenous  Once 09/10/23 1818 09/10/23 1840   09/10/23 1830  metroNIDAZOLE (FLAGYL) IVPB 500 mg        500 mg 100 mL/hr over 60 Minutes Intravenous  Once 09/10/23 1818 09/10/23 1955        Assessment/Plan Perforated gastric ulcer  POD4 s/p ex-lap with graham patch 10/13 Dr. 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    Moise Boring, MD [facility] 09/14/2023, 8:16 AM Please see Amion for pager number during day hours 7:00am-4:30pm

## 2023-09-14 NOTE — Plan of Care (Signed)
  Problem: Pain Managment: Goal: General experience of comfort will improve Outcome: Progressing   Problem: Safety: Goal: Ability to remain free from injury will improve Outcome: Progressing   Problem: Skin Integrity: Goal: Risk for impaired skin integrity will decrease Outcome: Progressing   

## 2023-09-14 NOTE — Progress Notes (Addendum)
Pt seen for increased SOB and significantly increased supplemental O2 requirement. He is being started on BiPAP. CXR concerning for CHF. Plan to give a dose of Lasix and proceed with BiPAP.   ADDENDUM: Patient refusing BiPAP, reports that his SOB is improving.

## 2023-09-14 NOTE — TOC Progression Note (Addendum)
Transition of Care [facility]) - Progression Note    Patient Details  Name: Corey Griffin MRN: [id number] Date of Birth: 25-Jul-1946  Transition of Care [facility]) CM/SW Contact  Delilah Shan, LCSWA Phone Number: 09/14/2023, 10:39 AM  Clinical Narrative:     CSW received call from social worker Oswaldo Done with [facility] APS tele# [phone]. Patients APS report was screened in. APS currently following. CSW will continue to follow.   Expected Discharge Plan: Home w Home Health Services Barriers to Discharge: Continued Medical Work up  Expected Discharge Plan and Services In-house Referral: Clinical Social Work Discharge Planning Services: CM Consult Post Acute Care Choice: Home Health Living arrangements for the past 2 months: Single Family Home                                       Social Determinants of Health (SDOH) Interventions SDOH Screenings   Tobacco Use: High Risk (09/10/2023)    Readmission Risk Interventions     No data to display

## 2023-09-14 NOTE — TOC Progression Note (Signed)
Transition of Care [facility]) - Progression Note    Patient Details  Name: Corey Griffin MRN: [id number] Date of Birth: 1946/01/30  Transition of Care [facility]) CM/SW Contact  Graves-Bigelow, Lamar Laundry, RN Phone Number: 09/14/2023, 4:20 PM  Clinical Narrative:  Case Manager discussed home health needs with the patient. Patient is declining PT; he states he is in too much pain and does not want therapy. Patient is open to [facility] RN for wound care. Case Manager did call [facility] to see if they can assist. Awaiting call back from the agency regarding safety in the home since the patient has had a decline in functional status. Case Manager will continue to follow in the morning.     Expected Discharge Plan: Home w Home Health Services Barriers to Discharge: Continued Medical Work up  Expected Discharge Plan and Services In-house Referral: Clinical Social Work Discharge Planning Services: CM Consult Post Acute Care Choice: Home Health Living arrangements for the past 2 months: Single Family Home   Social Determinants of Health (SDOH) Interventions SDOH Screenings   Food Insecurity: No Food Insecurity (09/14/2023)  Housing: Low Risk  (09/14/2023)  Transportation Needs: No Transportation Needs (09/14/2023)  Utilities: Not At Risk (09/14/2023)  Tobacco Use: High Risk (09/10/2023)    Readmission Risk Interventions     No data to display

## 2023-09-15 ENCOUNTER — Inpatient Hospital Stay (HOSPITAL_COMMUNITY): Payer: 59

## 2023-09-15 DIAGNOSIS — J90 Pleural effusion, not elsewhere classified: Secondary | ICD-10-CM

## 2023-09-15 DIAGNOSIS — J810 Acute pulmonary edema: Secondary | ICD-10-CM | POA: Diagnosis not present

## 2023-09-15 DIAGNOSIS — Z789 Other specified health status: Secondary | ICD-10-CM

## 2023-09-15 DIAGNOSIS — J690 Pneumonitis due to inhalation of food and vomit: Secondary | ICD-10-CM

## 2023-09-15 DIAGNOSIS — J9601 Acute respiratory failure with hypoxia: Secondary | ICD-10-CM

## 2023-09-15 DIAGNOSIS — Z66 Do not resuscitate: Secondary | ICD-10-CM

## 2023-09-15 DIAGNOSIS — K251 Acute gastric ulcer with perforation: Secondary | ICD-10-CM | POA: Diagnosis not present

## 2023-09-15 LAB — CBC WITH DIFFERENTIAL/PLATELET
Abs Immature Granulocytes: 0.98 10*3/uL — ABNORMAL HIGH (ref 0.00–0.07)
Basophils Absolute: 0.1 10*3/uL (ref 0.0–0.1)
Basophils Relative: 1 %
Eosinophils Absolute: 0.1 10*3/uL (ref 0.0–0.5)
Eosinophils Relative: 1 %
HCT: 33.6 % — ABNORMAL LOW (ref 39.0–52.0)
Hemoglobin: 10.4 g/dL — ABNORMAL LOW (ref 13.0–17.0)
Immature Granulocytes: 4 %
Lymphocytes Relative: 2 %
Lymphs Abs: 0.3 10*3/uL — ABNORMAL LOW (ref 0.7–4.0)
MCH: 31.8 pg (ref 26.0–34.0)
MCHC: 31 g/dL (ref 30.0–36.0)
MCV: 102.8 fL — ABNORMAL HIGH (ref 80.0–100.0)
Monocytes Absolute: 1.4 10*3/uL — ABNORMAL HIGH (ref 0.1–1.0)
Monocytes Relative: 6 %
Neutro Abs: 20.3 10*3/uL — ABNORMAL HIGH (ref 1.7–7.7)
Neutrophils Relative %: 86 %
Platelets: 510 10*3/uL — ABNORMAL HIGH (ref 150–400)
RBC: 3.27 MIL/uL — ABNORMAL LOW (ref 4.22–5.81)
RDW: 16.5 % — ABNORMAL HIGH (ref 11.5–15.5)
WBC: 23.3 10*3/uL — ABNORMAL HIGH (ref 4.0–10.5)
nRBC: 0 % (ref 0.0–0.2)

## 2023-09-15 LAB — CULTURE, BLOOD (ROUTINE X 2)
Culture: NO GROWTH
Culture: NO GROWTH

## 2023-09-15 LAB — PHOSPHORUS: Phosphorus: 2.8 mg/dL (ref 2.5–4.6)

## 2023-09-15 LAB — CULTURE, RESPIRATORY W GRAM STAIN: Culture: NORMAL

## 2023-09-15 LAB — COMPREHENSIVE METABOLIC PANEL
ALT: 19 U/L (ref 0–44)
AST: 19 U/L (ref 15–41)
Albumin: <1.5 g/dL — ABNORMAL LOW (ref 3.5–5.0)
Alkaline Phosphatase: 135 U/L — ABNORMAL HIGH (ref 38–126)
Anion gap: 8 (ref 5–15)
BUN: 26 mg/dL — ABNORMAL HIGH (ref 8–23)
CO2: 32 mmol/L (ref 22–32)
Calcium: 9.2 mg/dL (ref 8.9–10.3)
Chloride: 103 mmol/L (ref 98–111)
Creatinine, Ser: 0.56 mg/dL — ABNORMAL LOW (ref 0.61–1.24)
GFR, Estimated: >60 mL/min (ref >60)
Glucose, Bld: 142 mg/dL — ABNORMAL HIGH (ref 70–99)
Potassium: 3.5 mmol/L (ref 3.5–5.1)
Sodium: 143 mmol/L (ref 135–145)
Total Bilirubin: 0.6 mg/dL (ref 0.3–1.2)
Total Protein: 5.7 g/dL — ABNORMAL LOW (ref 6.5–8.1)

## 2023-09-15 LAB — ECHOCARDIOGRAM COMPLETE
AR max vel: 2 cm2
AV Area VTI: 2.25 cm2
AV Area mean vel: 1.98 cm2
AV Mean grad: 6 mm[Hg]
AV Peak grad: 12 mm[Hg]
Ao pk vel: 1.73 m/s
Area-P 1/2: 4.19 cm2
Height: 69 in
S' Lateral: 2.7 cm
Weight: 2331.58 [oz_av]

## 2023-09-15 LAB — BRAIN NATRIURETIC PEPTIDE: B Natriuretic Peptide: 356.4 pg/mL — ABNORMAL HIGH (ref 0.0–100.0)

## 2023-09-15 LAB — GLUCOSE, CAPILLARY: Glucose-Capillary: 120 mg/dL — ABNORMAL HIGH (ref 70–99)

## 2023-09-15 LAB — MAGNESIUM: Magnesium: 1.9 mg/dL (ref 1.7–2.4)

## 2023-09-15 MED ORDER — TRACE MINERALS CU-MN-SE-ZN 300-55-60-3000 MCG/ML IV SOLN
INTRAVENOUS | Status: AC
  Filled 2023-09-15: qty 2000

## 2023-09-15 MED ORDER — POTASSIUM CHLORIDE 10 MEQ/50ML IV SOLN
10.0000 meq | INTRAVENOUS | Status: AC
  Administered 2023-09-15 (×2): 10 meq via INTRAVENOUS
  Filled 2023-09-15 (×2): qty 50

## 2023-09-15 MED ORDER — MAGNESIUM SULFATE IN D5W 1-5 GM/100ML-% IV SOLN
1.0000 g | Freq: Once | INTRAVENOUS | Status: AC
  Administered 2023-09-15: 1 g via INTRAVENOUS
  Filled 2023-09-15: qty 100

## 2023-09-15 MED ORDER — FUROSEMIDE 10 MG/ML IJ SOLN
INTRAMUSCULAR | Status: AC
  Filled 2023-09-15: qty 2

## 2023-09-15 MED ORDER — FUROSEMIDE 10 MG/ML IJ SOLN
INTRAMUSCULAR | Status: AC
  Filled 2023-09-15: qty 4

## 2023-09-15 MED ORDER — POTASSIUM PHOSPHATES 15 MMOLE/5ML IV SOLN
15.0000 mmol | Freq: Once | INTRAVENOUS | Status: AC
  Administered 2023-09-15: 15 mmol via INTRAVENOUS
  Filled 2023-09-15: qty 5

## 2023-09-15 MED ORDER — SODIUM CHLORIDE 0.9 % IV SOLN
2.0000 g | Freq: Three times a day (TID) | INTRAVENOUS | Status: DC
  Administered 2023-09-15 – 2023-09-18 (×9): 2 g via INTRAVENOUS
  Filled 2023-09-15 (×9): qty 12.5

## 2023-09-15 MED ORDER — METRONIDAZOLE 500 MG/100ML IV SOLN
500.0000 mg | Freq: Two times a day (BID) | INTRAVENOUS | Status: DC
  Administered 2023-09-15 – 2023-09-17 (×6): 500 mg via INTRAVENOUS
  Filled 2023-09-15 (×7): qty 100

## 2023-09-15 MED ORDER — FUROSEMIDE 10 MG/ML IJ SOLN
60.0000 mg | Freq: Two times a day (BID) | INTRAMUSCULAR | Status: AC
  Administered 2023-09-15: 60 mg via INTRAVENOUS

## 2023-09-15 MED ORDER — ONDANSETRON HCL 4 MG/2ML IJ SOLN
4.0000 mg | Freq: Four times a day (QID) | INTRAMUSCULAR | Status: DC | PRN
  Administered 2023-09-15: 4 mg via INTRAVENOUS
  Filled 2023-09-15: qty 2

## 2023-09-15 MED ORDER — ENOXAPARIN SODIUM 40 MG/0.4ML IJ SOSY
40.0000 mg | PREFILLED_SYRINGE | INTRAMUSCULAR | Status: DC
  Administered 2023-09-15 – 2023-09-17 (×3): 40 mg via SUBCUTANEOUS
  Filled 2023-09-15 (×3): qty 0.4

## 2023-09-15 MED ORDER — FAT EMUL FISH OIL/PLANT BASED 20% (SMOFLIPID)IV EMUL
250.0000 mL | INTRAVENOUS | Status: AC
  Administered 2023-09-15: 250 mL via INTRAVENOUS
  Filled 2023-09-15: qty 250

## 2023-09-15 NOTE — Progress Notes (Signed)
Patient vomited greenish vomitus in color approximately . Notified Dr. Antionette Char and ordered for PRN zofran.

## 2023-09-15 NOTE — Plan of Care (Signed)
°  Problem: Clinical Measurements: °Goal: Respiratory complications will improve °Outcome: Progressing °Goal: Cardiovascular complication will be avoided °Outcome: Progressing °  °Problem: Elimination: °Goal: Will not experience complications related to urinary retention °Outcome: Progressing °  °Problem: Safety: °Goal: Ability to remain free from injury will improve °Outcome: Progressing °  °

## 2023-09-15 NOTE — Plan of Care (Signed)
CHL Tonsillectomy/Adenoidectomy, Postoperative PEDS care plan entered in error.

## 2023-09-15 NOTE — IPAL (Signed)
  Interdisciplinary Goals of Care Family Meeting   Date carried out: 09/15/2023  Location of the meeting: Bedside  Member's involved: Bedside Registered Nurse and Family Member or next of kin, Physician Assistant  Durable Power of Attorney or acting medical decision maker: Patient himself, daughter also present at bedside to assist    Discussion: We discussed goals of care for Corey Griffin .  I have had an extensive discussion with Mr. Normal as well as his daughter at the bedside. We discussed his hospital course, his current circumstances and wishes moving forward given the circumstances. I explained that in his frail and deconditioned state, CPR and mechanical ventilation might in fact cause more harm and backtracking versus trying to get him to the point to return home. He acknowledges this given how weak he is and his underlying COPD, recurrent aspirations, etc. After discussion with his daughter at bedside as well as a grandson over the phone, the family has decided not to perform resuscitation if arrest were to occur, not to pursue mechanical ventilation in the event of respiratory decompensation, but to otherwise continue with current medical support / therapies.  Pt's RN, Thayer Ohm was present for these discussions and confirms DNR/DNI.  Pt has requested social work assistance for advanced directives etc. I will place this order.  Code status:   Code Status: Limited: Do not attempt resuscitation (DNR) -DNR-LIMITED -Do Not Intubate/DNI    Disposition: Continue current acute care  Time spent for the meeting: 35 minutes.    Rutherford Guys, PA-C  09/15/2023, 11:41 AM

## 2023-09-15 NOTE — Progress Notes (Signed)
PHARMACY - TOTAL PARENTERAL NUTRITION CONSULT NOTE   Indication:  Gastric Ulcer Perforation  Patient Measurements: Height: 5\' 9"  (175.3 cm) Weight: 66.1 kg (145 lb 11.6 oz) IBW/kg (Calculated) : 70.7 TPN AdjBW (KG): 68 Body mass index is 21.52 kg/m. Usual Weight: 180 lbs.  Assessment:  16 YOM admitted for perforation of gastric ulcer after presenting 08/29/2023) with several days of abdominal pain and distention which prevented him from eating or drinking except for small sips of water. Underwent exploratory laparotomy, primary repair of gastric ulcer (1.5 cm), biopsy, and graham patch at [facility] 09/26/2023). PMHx significant for chronic constipation and thinning of abdominal wall with abdominal distention, COPD on home O2. Unable to speak with patient due to intubation.  Presumed moderate-to-high refeeding risk given temporal wasting per MD. Plan for NPO for at least 5 days post-op per team. Pharmacy consulted for TPN.  Unable to make customized TPN and utilizing Clinimix 8/10 due to IVF shortage caused by [facility] per hospital policy. Will not be able to meet full nutrition goals due to limited stock and fluid restrictions. Will replace electrolytes and give lipids outside of Clinimix.   10/16: Lytes trending down steadily, potentially refeeding, will continue to monitor.  Glucose / Insulin: No Hx of DM, A1c unknown, BG < 180 - no insulin Electrolytes: Phos 2.8, CoCa 10.7, Mag 1.9, K 3.5, Na 143, Ch 103 Renal: Scr 0.56, BUN 26 (trending down) Hepatic: Albumin < 1.5, Tbili 0.6, AlkPhos/AST/ALT wnl, TG 63 Intake / Output; MIVF: UOP 2.6 mL/kg/hr, NGT 100 mL, JP drain 30 mL  GI Imaging: 09/23/2023 CT Abdomen/Pelvis: free air concerning for gastric or duodenal ulcer, likely acute cholecystitis  08/31/2023 KUB: nonobstructive bowel gas 10/17 UGI: no contrast leak, no identifiable obstruction or small bowel abnormality  GI Surgeries/Procedures:  September 18, 2023: Ex-lap, graham patch, JP drain and NGT  placement 10/14: Extubated, propofol off  Central access: PICC 10/14 TPN start date: 10/14  Nutritional Goals: Clinimix E 8/10 1000 mL/day MW @ 42 mL/hr (provides 80 g protein and 664 kcal per day)  Clinimix E 8/10 2000 mL/day TTFriSatSun @ 82 mL/hr (provides 160 g protein and 1325 kcal per day) To provide an average of 137 g AA and 1586 Kcal per day.   RD Assessment:  Estimated Needs Total Energy Estimated Needs: 1900-2100 kcals Total Protein Estimated Needs: 110-125 g Total Fluid Estimated Needs: >/= 1.8 L  Current Nutrition:  TPN NPO  Plan:  Continue Clinimix E 8/10 at 1800: Will alternate: - per day (42 ml/hr) on M/Th - per day (54ml/hr) on Tu/Wed/Fri/Sat/Sun - Smof lipid to 250 ml daily To provide an average of 137 g AA and 1586 Kcal per day, meeting 100% of protein and ~83% of Kcal goals; lipids provide 28% of kcals. Unable to meet full nutrition requirements due to Clinimix and volume limitations.   Electrolytes:  - 1000 mL per day: Na 35 mEq, K 30 mEq, Mag 5 mEq, Phos 15 mmol, Acetate 83 mEq, Cl 76 mEq in Clinimix - 2000 mL per day:no E-lytes within TPN 10/18 Additional: - Mag 1 g IV - Kphos 15 mmol x1 (will provide ~20 meq K) - K 10 meq iv x2 runs Add to clinimix standard MVI and trace elements, no chromium due to shortage Additional IVF per team Monitor TPN labs daily while on Clinimix F/u clear liquid diet trial given results of UGI   Shann Merrick BS, PharmD, BCPS Clinical Pharmacist 09/15/2023 8:16 AM  Contact: [phone] after 3 PM  "  Be curious, not judgmental..." -Debbora Dus

## 2023-09-15 NOTE — Progress Notes (Signed)
NAME:  Corey Griffin, MRN:  [id number], DOB:  July 16, 1946, LOS: 5 ADMISSION DATE:  09/21/2023, CONSULTATION DATE:  10/13 REFERRING MD:  Dr. Henreitta Leber, CHIEF COMPLAINT:  perforated viscus   History of Present Illness:  Patient is a  77 yo M w/ pertinent PMH COPD on o2(seen by Dr. Sherene Sires), chronic pain, chronic constipation presents to [facility] ED on 10/13 w/ abd distention and pain.  Patient states he has had abd distention, constipation, and pain for several days. Uanble to eat or drink due to the pain. Denies any vomiting or diarrhea. Has hx of severe abd wall thinning. Concerned he has a blockage and came to [facility] on 10/13 for further workup. On arrival patient tachycardic 110s, afebrile, bp stable. Abd distended w/ pain on palpation. WBC 16. Cultures obtained and started on cefepime/flagyl. UA negative. Lipase wnl. CT abd/pelvis showing free air in stomach excision for perforated gastric ulcer. Surgery consulted. Taken to OR for ex lap with gastric ulcer repair, biopsy, graham patch placement. JP drain in place. Given hx of COPD plan to keep post op intubated overnight and transfer to [facility] ICU. PCCM consulted for transfer.  Pertinent  Medical History   Past Medical History:  Diagnosis Date   COPD (chronic obstructive pulmonary disease) (HCC)      Significant Hospital Events: Including procedures, antibiotic start and stop dates in addition to other pertinent events   10/13 admit to [facility] for gastric ulcer w/ free air in abd; taking to OR for ex lap with graham patch; post op remains intubated transfer to [facility]; pccm consulted 10/14 Extubated 10/15 transfer out of ICU 10/17 some resp distress overnight, given lasix and offered BiPAP but he refused, placed on HHFNC 40L at 40%. CXR overnight showed interstitial opacities bilaterally with possible LUL cavitation and small R effusion  Interim History / Subjective:  Called back after further respiratory distress. CXR with interstitial opacities bilaterally  with possible LUL cavitation and small R effusion. Bedside POCUS showed consolidated RLL with probable parapneumoic effusion.  Long discussion with pt and daughter. See IPAL note prior to this. In summary, DNR/DNI in the event of decline but otherwise continue full supportive care.  Objective   Blood pressure 105/63, pulse (!) 101, temperature 98 F (36.7 C), temperature source Oral, resp. rate (!) 26, height 5\' 9"  (1.753 m), weight 66.1 kg, SpO2 93%.    FiO2 (%):  [40 %] 40 %   Intake/Output Summary (Last 24 hours) at 09/15/2023 1216 Last data filed at 09/15/2023 1122 Gross per 24 hour  Intake 2002.7 ml  Output 4080 ml  Net -2077.3 ml   Filed Weights   09/04/2023 1617 09/11/23 0600 09/12/23 0443  Weight: 68 kg 65.3 kg 66.1 kg   Physical exam: General: Elderly male, chronically ill appearing, frail, resting in bed, in NAD. Neuro: A&O x 3, globally weak. HEENT: [place]/AT. Sclerae anicteric. EOMI. MM moist. Cardiovascular: RRR, no M/R/G.  Lungs: Respirations even and unlabored.  Faint crackles bilaterally, diminished bases R > L. Abdomen: Abdominal dressings C/D/I. BS hypoactive. Tender throughout.  Musculoskeletal: Skin flaking, some stasis changes, 1+ edema. Skin: Intact, warm, no rashes. Per notes, stage III decubitus ulcer POA   Assessment & Plan:   Acute hypoxic respiratory failure - multifactorial in setting significant deconditioning, recurrent aspirations, PNA, pleural effusion, underlying COPD. POCUS by Dr. Aundria Rud 10/18 with RLL consolidation and probable parapneumonic effusion but pt too deconditioned to tolerate thora vs chest tube at time being. - Continue HHFNC and wean as able. -  Continue empiric Cefepime, Flagyl. - He can not tolerate thora or chest tube currently. - Would repeat CT chest 10/19 or 10/20 and re-evaluate. - Dr. Aundria Rud will plan for daily repeat POCUS 10/19 and 10/20. Will consider thora vs chest tube based off changes. - Bronchial hygiene. - BiPAP not  an option at this time due to his nausea/vomiting. - DNI if decompensates.  Perforated gastric ulcer s/p ex lap with Cheree Ditto patch 2023/10/03 H. Pylori induced PUD - General surgery following/managing. - NPO given ongoing emesis. - Continue TPN. - Continue Diflucan, PPI. - Might need new NGT placement.   Goals of care: - I have had an extensive discussion with Mr. Normal as well as his daughter at the bedside. We discussed his hospital course, his current circumstances and wishes moving forward given the circumstances. I explained that in his frail and deconditioned state, CPR and mechanical ventilation might in fact cause more harm and backtracking versus trying to get him to the point to return home. He acknowledges this given how weak he is and his underlying COPD, recurrent aspirations, etc. After discussion with his daughter at bedside as well as a grandson over the phone, the family has decided not to perform resuscitation if arrest were to occur, not to pursue mechanical ventilation in the event of respiratory decompensation, but to otherwise continue with current medical support / therapies.  Pt's RN, Thayer Ohm was present for these discussions and confirms DNR/DNI.   Rest per primary team.  Best Practice (right click and "Reselect all SmartList Selections" daily)  Per primary.    Rutherford Guys, PA - [facility] [place] [facility] For pager details, please see AMION or use Epic chat  After 1900, please call [facility] for cross coverage needs 09/15/2023, 1:11 PM

## 2023-09-15 NOTE — Progress Notes (Signed)
   09/15/23 1723  Assess: MEWS Score  Temp 99 F (37.2 C)  BP (!) 94/55  MAP (mmHg) 68  Pulse Rate (!) 108  ECG Heart Rate (!) 107  Resp (!) 28  SpO2 92 %  O2 Device HFNC  FiO2 (%) 40 %  Assess: MEWS Score  MEWS Temp 0  MEWS Systolic 1  MEWS Pulse 1  MEWS RR 2  MEWS LOC 0  MEWS Score 4  MEWS Score Color Red  Assess: if the MEWS score is Yellow or Red  Were vital signs accurate and taken at a resting state? Yes  Does the patient meet 2 or more of the SIRS criteria? Yes  Does the patient have a confirmed or suspected source of infection? Yes  MEWS guidelines implemented  Yes, red  Treat  MEWS Interventions Considered administering scheduled or prn medications/treatments as ordered  Take Vital Signs  Increase Vital Sign Frequency  Red: Q1hr x2, continue Q4hrs until patient remains green for 12hrs  Escalate  MEWS: Escalate Red: Discuss with charge nurse and notify provider. Consider notifying RRT. If remains red for 2 hours consider need for higher level of care  Notify: Charge Nurse/RN  Name of Charge Nurse/RN Notified Hinton Dyer, RN  Provider Notification  Provider Name/Title Dr. Lowell Guitar  Date Provider Notified 09/15/23  Time Provider Notified 1735  Method of Notification Page  Notification Reason Change in status  Provider response See new orders  Date of Provider Response 09/15/23  Time of Provider Response 1738  Notify: Rapid Response  Name of Rapid Response RN Notified Dahlia Client, RN  Date Rapid Response Notified 09/15/23  Time Rapid Response Notified 1735  Assess: SIRS CRITERIA  SIRS Temperature  0  SIRS Pulse 1  SIRS Respirations  1  SIRS WBC 1  SIRS Score Sum  3   Changes in patient condition/vital signs leading to a RED MEWS.  Axillary temp of 99.0*F, HR is low 100s (sinus tachycardia), respiratory rate 22-28 range, BP has decreased to 90s/50s (MAP 68).  Patient is arouseable but very fatigued and falls back asleep readily.  SPO2 is 88 - 92% on 40% O2 via high  flow canula (40L/m).  Made rapid response RN, Dahlia Client aware (already on unit) and she will round.  Dr. Lowell Guitar made aware of changes by secure message and responded with additional query.  Possible MD orders pending.  Will continue to monitor closely.

## 2023-09-15 NOTE — Significant Event (Signed)
Rapid Response Event Note   Reason for Call :  Hypoxia.   Per RN, pt SpO2 dropped to 78% on 4L [place]. He was placed on 15 HFNC(salter) with SpO2 increasing to mid 80s. He was then placed on NRB with SpO2 increasing to 95%.  Initial Focused Assessment:  Pt lying in bed with eyes closed. His breathing is tachypneic and labored. He is alert and oriented, c/o SOB. He says he is always SOB but when he his SOB worsens(like now), he turns his oxygen up to 5L and his breathing improves after a few mintues. Lungs are diminished t/o. Skin warm and dry.   T-98, HR-109, BP-110/68, RR-28, SpO2-98% on 15L HFNC.  After assessment done, pt's SpO2 dropped to 83% on 15L HFNC. RT attempted to place pt on VM. Pt now refusing to have any type of mask on his face. Dr. Antionette Char notified by bedside RN. MD aware of pt refusing mask and is okay with SpO2>88%.   Interventions:  NRB-pt now refusing PCXR-New B lower interstitial and patchy hazy opacities with small layering R pl effusion.  Albuterol tx Bipap-pt refuses 60mg  Lasix IV Plan of Care:  Allow time for lasix to work. If SpO2 drops below 88% and doesn't recover, HHFNC may be a good option. This plan was discussed with pt and Dr. Constance Goltz.   Event Summary:   MD Notified: Dr Antionette Char notified by bedside RN Call Time:2301 Arrival Time:2303 End Time:0019  Terrilyn Saver, RN

## 2023-09-15 NOTE — Progress Notes (Signed)
PT Cancellation Note  Patient Details Name: Corey Griffin MRN: [id number] DOB: 15-Nov-1946   Cancelled Treatment:    Reason Eval/Treat Not Completed: Medical issues which prohibited therapy. Pt not medically appropriate at this date per RN and PA. Acute PT to follow as able.  Hilton Cork, PT, DPT Secure Chat Preferred  Re[facility] Office [phone]   Arturo Morton Brion Aliment 09/15/2023, 3:21 PM

## 2023-09-15 NOTE — Progress Notes (Addendum)
at [facility], [address], [place], [place] [address]    Gram Stain   Final    NO WBC SEEN RARE YEAST Performed at [facility], [address], [place], [place] [address]    Culture   Final    RARE CANDIDA ALBICANS RARE GLAND NO ANAEROBES ISOLATED; CULTURE IN PROGRESS FOR 5 DAYS    Report Status PENDING  Incomplete  MRSA Next Gen by PCR, Nasal     Status: None   Collection Time: 10/02/2023 11:17 PM  Result Value Ref Range Status   MRSA by PCR Next Gen NOT DETECTED NOT DETECTED Final    Comment:  (NOTE) The GeneXpert MRSA Assay (FDA approved for NASAL specimens only), is one component of Garlon Tuggle comprehensive MRSA colonization surveillance program. It is not intended to diagnose MRSA infection nor to guide or monitor treatment for MRSA infections. Test performance is not FDA approved in patients less than 56 years old. Performed at [facility], [address] [place], [place] [address]   Culture, Respiratory w Gram Stain     Status: None (Preliminary result)   Collection Time: 09/11/23 12:30 AM   Specimen: Sputum  Result Value Ref Range Status   Specimen Description SPU  Final   Special Requests NONE  Final   Gram Stain   Final    ABUNDANT WBC PRESENT, PREDOMINANTLY PMN RARE GRAM POSITIVE COCCI IN PAIRS    Culture   Final    CULTURE REINCUBATED FOR BETTER GROWTH Performed at [facility], [address] [place], [place] [address]    Report Status PENDING  Incomplete         Radiology Studies: DG CHEST PORT 1 VIEW  Result Date: 09/14/2023 CLINICAL DATA:  [id number] with acute and chronic respiratory failure with hypoxia. EXAM: PORTABLE CHEST 1 VIEW COMPARISON:  Portable chest 10-02-2023 FINDINGS: 11:02 p.m. NGT/ETT interval removal. Right PICC interval insertion the tip in the mid to distal SVC. No pneumothorax is seen. The heart is slightly enlarged. The vascular markings appear normal caliber. Interstitial and patchy increased hazy opacities are noted today in the lower lung fields, on the right associated with Jacalynn Buzzell new small layering pleural effusion. Findings could be due to edema and/or pneumonia. There is Meliya Mcconahy coarse interstitial infiltrate in the peripheral left upper lobe which was not seen on Jonerik Sliker 2022 chest x-ray. There is Eugen Jeansonne suspected 2.6 cm cavity within this opacity. Findings could be infectious, post inflammatory or neoplastic. Rest of the lungs appear clear with COPD. Stable mediastinum with aortic tortuosity and calcification. The patient obliquely rotated to the  right. Osteopenia and thoracic spondylosis with no new osseous findings. IMPRESSION: 1. New bilateral lower zonal interstitial and patchy hazy opacities with small layering right pleural effusion. Findings could be due to edema and/or pneumonia. 2. Again noted fairly broad-based coarse interstitial infiltrate in the peripheral left upper lobe with Lawsyn Heiler suspected underlying 2.6 cm cavity. Findings could be infectious, post inflammatory or neoplastic and were not seen on the last chest x-ray on 04/02/2021. Consider follow-up chest CT for further evaluation, preferably with contrast. 3. COPD. 4. Aortic atherosclerosis. Electronically Signed   By: Almira Bar M.D.   On: 09/14/2023 23:22   DG UGI W SINGLE CM (SOL OR THIN BA)  Result Date: 09/14/2023 CLINICAL DATA:  Patient with Vannie Hochstetler history of perforated gastric ulcer status post exploratory laparotomy and Graham patch. EXAM: DG UGI W SINGLE CM TECHNIQUE: Scout radiograph was obtained. Single contrast examination  1.5* <1.5*     CBG: Recent Labs  Lab 09/13/23 2323 09/14/23 0603 09/14/23 1150 09/14/23 2037 09/15/23 0806  GLUCAP 125* 117* 121* 119* 120*     Recent Results (from the past 240 hour(s))  Blood culture (routine x 2)     Status: None   Collection Time: October 10, 2023  6:09 PM   Specimen: Left Antecubital; Blood  Result Value Ref Range Status   Specimen Description LEFT ANTECUBITAL BLOOD  Final   Special Requests   Final    BOTTLES DRAWN AEROBIC AND ANAEROBIC Blood Culture results may not be optimal due to an excessive volume of blood received in culture bottles   Culture   Final    NO GROWTH 5 DAYS Performed at [facility], [address] [place], [place] [address]    Report Status 09/15/2023 FINAL  Final  Blood culture (routine x 2)     Status: None   Collection Time: 2023/10/10  6:22 PM   Specimen: BLOOD LEFT HAND  Result Value Ref Range Status   Specimen Description BLOOD LEFT HAND BLOOD  Final   Special Requests   Final    BOTTLES DRAWN AEROBIC AND ANAEROBIC Blood Culture results may not be optimal due to an excessive volume of blood received in culture bottles   Culture   Final    NO GROWTH 5 DAYS Performed at [facility], [address], [place], [place] [address]    Report Status 09/15/2023 FINAL  Final  SARS Coronavirus 2 by RT PCR (hospital order, performed in [facility] hospital lab) *cepheid single result test*     Status: None   Collection Time: Oct 10, 2023  7:48 PM  Result Value Ref Range Status   SARS Coronavirus 2 by RT PCR NEGATIVE NEGATIVE Final    Comment: (NOTE) SARS-CoV-2 target nucleic acids are NOT DETECTED.  The SARS-CoV-2 RNA is generally detectable in upper and lower respiratory specimens during the acute phase of infection. The lowest concentration of SARS-CoV-2 viral copies this assay can detect is 250 copies / mL. Harrold Fitchett negative result does not preclude SARS-CoV-2 infection and should not be used as the sole basis for treatment or other patient management  decisions.  Beatryce Colombo negative result may occur with improper specimen collection / handling, submission of specimen other than nasopharyngeal swab, presence of viral mutation(s) within the areas targeted by this assay, and inadequate number of viral copies (<250 copies / mL). Gennesis Hogland negative result must be combined with clinical observations, patient history, and epidemiological information.  Fact Sheet for Patients:   RoadLapTop.co.za  Fact Sheet for Healthcare Providers: http://kim-miller.com/  This test is not yet approved or  cleared by the [place] FDA and has been authorized for detection and/or diagnosis of SARS-CoV-2 by FDA under an Emergency Use Authorization (EUA).  This EUA will remain in effect (meaning this test can be used) for the duration of the COVID-19 declaration under Section 564(b)(1) of the Act, 21 U.S.C. section 360bbb-3(b)(1), unless the authorization is terminated or revoked sooner.  Performed at [facility], [address] [place], [place] [address]   Aerobic/Anaerobic Culture w Gram Stain (surgical/deep wound)     Status: None (Preliminary result)   Collection Time: 2023/10/10  8:56 PM   Specimen: Path fluid; Tissue  Result Value Ref Range Status   Specimen Description   Final    FLUID Performed at [facility], [address], [place], [place] [address]    Special Requests   Final    NONE Performed  1.5* <1.5*     CBG: Recent Labs  Lab 09/13/23 2323 09/14/23 0603 09/14/23 1150 09/14/23 2037 09/15/23 0806  GLUCAP 125* 117* 121* 119* 120*     Recent Results (from the past 240 hour(s))  Blood culture (routine x 2)     Status: None   Collection Time: October 10, 2023  6:09 PM   Specimen: Left Antecubital; Blood  Result Value Ref Range Status   Specimen Description LEFT ANTECUBITAL BLOOD  Final   Special Requests   Final    BOTTLES DRAWN AEROBIC AND ANAEROBIC Blood Culture results may not be optimal due to an excessive volume of blood received in culture bottles   Culture   Final    NO GROWTH 5 DAYS Performed at [facility], [address] [place], [place] [address]    Report Status 09/15/2023 FINAL  Final  Blood culture (routine x 2)     Status: None   Collection Time: 2023/10/10  6:22 PM   Specimen: BLOOD LEFT HAND  Result Value Ref Range Status   Specimen Description BLOOD LEFT HAND BLOOD  Final   Special Requests   Final    BOTTLES DRAWN AEROBIC AND ANAEROBIC Blood Culture results may not be optimal due to an excessive volume of blood received in culture bottles   Culture   Final    NO GROWTH 5 DAYS Performed at [facility], [address], [place], [place] [address]    Report Status 09/15/2023 FINAL  Final  SARS Coronavirus 2 by RT PCR (hospital order, performed in [facility] hospital lab) *cepheid single result test*     Status: None   Collection Time: Oct 10, 2023  7:48 PM  Result Value Ref Range Status   SARS Coronavirus 2 by RT PCR NEGATIVE NEGATIVE Final    Comment: (NOTE) SARS-CoV-2 target nucleic acids are NOT DETECTED.  The SARS-CoV-2 RNA is generally detectable in upper and lower respiratory specimens during the acute phase of infection. The lowest concentration of SARS-CoV-2 viral copies this assay can detect is 250 copies / mL. Harrold Fitchett negative result does not preclude SARS-CoV-2 infection and should not be used as the sole basis for treatment or other patient management  decisions.  Beatryce Colombo negative result may occur with improper specimen collection / handling, submission of specimen other than nasopharyngeal swab, presence of viral mutation(s) within the areas targeted by this assay, and inadequate number of viral copies (<250 copies / mL). Gennesis Hogland negative result must be combined with clinical observations, patient history, and epidemiological information.  Fact Sheet for Patients:   RoadLapTop.co.za  Fact Sheet for Healthcare Providers: http://kim-miller.com/  This test is not yet approved or  cleared by the [place] FDA and has been authorized for detection and/or diagnosis of SARS-CoV-2 by FDA under an Emergency Use Authorization (EUA).  This EUA will remain in effect (meaning this test can be used) for the duration of the COVID-19 declaration under Section 564(b)(1) of the Act, 21 U.S.C. section 360bbb-3(b)(1), unless the authorization is terminated or revoked sooner.  Performed at [facility], [address] [place], [place] [address]   Aerobic/Anaerobic Culture w Gram Stain (surgical/deep wound)     Status: None (Preliminary result)   Collection Time: 2023/10/10  8:56 PM   Specimen: Path fluid; Tissue  Result Value Ref Range Status   Specimen Description   Final    FLUID Performed at [facility], [address], [place], [place] [address]    Special Requests   Final    NONE Performed  at [facility], [address], [place], [place] [address]    Gram Stain   Final    NO WBC SEEN RARE YEAST Performed at [facility], [address], [place], [place] [address]    Culture   Final    RARE CANDIDA ALBICANS RARE GLAND NO ANAEROBES ISOLATED; CULTURE IN PROGRESS FOR 5 DAYS    Report Status PENDING  Incomplete  MRSA Next Gen by PCR, Nasal     Status: None   Collection Time: 10/02/2023 11:17 PM  Result Value Ref Range Status   MRSA by PCR Next Gen NOT DETECTED NOT DETECTED Final    Comment:  (NOTE) The GeneXpert MRSA Assay (FDA approved for NASAL specimens only), is one component of Garlon Tuggle comprehensive MRSA colonization surveillance program. It is not intended to diagnose MRSA infection nor to guide or monitor treatment for MRSA infections. Test performance is not FDA approved in patients less than 56 years old. Performed at [facility], [address] [place], [place] [address]   Culture, Respiratory w Gram Stain     Status: None (Preliminary result)   Collection Time: 09/11/23 12:30 AM   Specimen: Sputum  Result Value Ref Range Status   Specimen Description SPU  Final   Special Requests NONE  Final   Gram Stain   Final    ABUNDANT WBC PRESENT, PREDOMINANTLY PMN RARE GRAM POSITIVE COCCI IN PAIRS    Culture   Final    CULTURE REINCUBATED FOR BETTER GROWTH Performed at [facility], [address] [place], [place] [address]    Report Status PENDING  Incomplete         Radiology Studies: DG CHEST PORT 1 VIEW  Result Date: 09/14/2023 CLINICAL DATA:  [id number] with acute and chronic respiratory failure with hypoxia. EXAM: PORTABLE CHEST 1 VIEW COMPARISON:  Portable chest 10-02-2023 FINDINGS: 11:02 p.m. NGT/ETT interval removal. Right PICC interval insertion the tip in the mid to distal SVC. No pneumothorax is seen. The heart is slightly enlarged. The vascular markings appear normal caliber. Interstitial and patchy increased hazy opacities are noted today in the lower lung fields, on the right associated with Jacalynn Buzzell new small layering pleural effusion. Findings could be due to edema and/or pneumonia. There is Meliya Mcconahy coarse interstitial infiltrate in the peripheral left upper lobe which was not seen on Jonerik Sliker 2022 chest x-ray. There is Eugen Jeansonne suspected 2.6 cm cavity within this opacity. Findings could be infectious, post inflammatory or neoplastic. Rest of the lungs appear clear with COPD. Stable mediastinum with aortic tortuosity and calcification. The patient obliquely rotated to the  right. Osteopenia and thoracic spondylosis with no new osseous findings. IMPRESSION: 1. New bilateral lower zonal interstitial and patchy hazy opacities with small layering right pleural effusion. Findings could be due to edema and/or pneumonia. 2. Again noted fairly broad-based coarse interstitial infiltrate in the peripheral left upper lobe with Lawsyn Heiler suspected underlying 2.6 cm cavity. Findings could be infectious, post inflammatory or neoplastic and were not seen on the last chest x-ray on 04/02/2021. Consider follow-up chest CT for further evaluation, preferably with contrast. 3. COPD. 4. Aortic atherosclerosis. Electronically Signed   By: Almira Bar M.D.   On: 09/14/2023 23:22   DG UGI W SINGLE CM (SOL OR THIN BA)  Result Date: 09/14/2023 CLINICAL DATA:  Patient with Vannie Hochstetler history of perforated gastric ulcer status post exploratory laparotomy and Graham patch. EXAM: DG UGI W SINGLE CM TECHNIQUE: Scout radiograph was obtained. Single contrast examination  1.5* <1.5*     CBG: Recent Labs  Lab 09/13/23 2323 09/14/23 0603 09/14/23 1150 09/14/23 2037 09/15/23 0806  GLUCAP 125* 117* 121* 119* 120*     Recent Results (from the past 240 hour(s))  Blood culture (routine x 2)     Status: None   Collection Time: October 10, 2023  6:09 PM   Specimen: Left Antecubital; Blood  Result Value Ref Range Status   Specimen Description LEFT ANTECUBITAL BLOOD  Final   Special Requests   Final    BOTTLES DRAWN AEROBIC AND ANAEROBIC Blood Culture results may not be optimal due to an excessive volume of blood received in culture bottles   Culture   Final    NO GROWTH 5 DAYS Performed at [facility], [address] [place], [place] [address]    Report Status 09/15/2023 FINAL  Final  Blood culture (routine x 2)     Status: None   Collection Time: 2023/10/10  6:22 PM   Specimen: BLOOD LEFT HAND  Result Value Ref Range Status   Specimen Description BLOOD LEFT HAND BLOOD  Final   Special Requests   Final    BOTTLES DRAWN AEROBIC AND ANAEROBIC Blood Culture results may not be optimal due to an excessive volume of blood received in culture bottles   Culture   Final    NO GROWTH 5 DAYS Performed at [facility], [address], [place], [place] [address]    Report Status 09/15/2023 FINAL  Final  SARS Coronavirus 2 by RT PCR (hospital order, performed in [facility] hospital lab) *cepheid single result test*     Status: None   Collection Time: Oct 10, 2023  7:48 PM  Result Value Ref Range Status   SARS Coronavirus 2 by RT PCR NEGATIVE NEGATIVE Final    Comment: (NOTE) SARS-CoV-2 target nucleic acids are NOT DETECTED.  The SARS-CoV-2 RNA is generally detectable in upper and lower respiratory specimens during the acute phase of infection. The lowest concentration of SARS-CoV-2 viral copies this assay can detect is 250 copies / mL. Harrold Fitchett negative result does not preclude SARS-CoV-2 infection and should not be used as the sole basis for treatment or other patient management  decisions.  Beatryce Colombo negative result may occur with improper specimen collection / handling, submission of specimen other than nasopharyngeal swab, presence of viral mutation(s) within the areas targeted by this assay, and inadequate number of viral copies (<250 copies / mL). Gennesis Hogland negative result must be combined with clinical observations, patient history, and epidemiological information.  Fact Sheet for Patients:   RoadLapTop.co.za  Fact Sheet for Healthcare Providers: http://kim-miller.com/  This test is not yet approved or  cleared by the [place] FDA and has been authorized for detection and/or diagnosis of SARS-CoV-2 by FDA under an Emergency Use Authorization (EUA).  This EUA will remain in effect (meaning this test can be used) for the duration of the COVID-19 declaration under Section 564(b)(1) of the Act, 21 U.S.C. section 360bbb-3(b)(1), unless the authorization is terminated or revoked sooner.  Performed at [facility], [address] [place], [place] [address]   Aerobic/Anaerobic Culture w Gram Stain (surgical/deep wound)     Status: None (Preliminary result)   Collection Time: 2023/10/10  8:56 PM   Specimen: Path fluid; Tissue  Result Value Ref Range Status   Specimen Description   Final    FLUID Performed at [facility], [address], [place], [place] [address]    Special Requests   Final    NONE Performed  1.5* <1.5*     CBG: Recent Labs  Lab 09/13/23 2323 09/14/23 0603 09/14/23 1150 09/14/23 2037 09/15/23 0806  GLUCAP 125* 117* 121* 119* 120*     Recent Results (from the past 240 hour(s))  Blood culture (routine x 2)     Status: None   Collection Time: October 10, 2023  6:09 PM   Specimen: Left Antecubital; Blood  Result Value Ref Range Status   Specimen Description LEFT ANTECUBITAL BLOOD  Final   Special Requests   Final    BOTTLES DRAWN AEROBIC AND ANAEROBIC Blood Culture results may not be optimal due to an excessive volume of blood received in culture bottles   Culture   Final    NO GROWTH 5 DAYS Performed at [facility], [address] [place], [place] [address]    Report Status 09/15/2023 FINAL  Final  Blood culture (routine x 2)     Status: None   Collection Time: 2023/10/10  6:22 PM   Specimen: BLOOD LEFT HAND  Result Value Ref Range Status   Specimen Description BLOOD LEFT HAND BLOOD  Final   Special Requests   Final    BOTTLES DRAWN AEROBIC AND ANAEROBIC Blood Culture results may not be optimal due to an excessive volume of blood received in culture bottles   Culture   Final    NO GROWTH 5 DAYS Performed at [facility], [address], [place], [place] [address]    Report Status 09/15/2023 FINAL  Final  SARS Coronavirus 2 by RT PCR (hospital order, performed in [facility] hospital lab) *cepheid single result test*     Status: None   Collection Time: Oct 10, 2023  7:48 PM  Result Value Ref Range Status   SARS Coronavirus 2 by RT PCR NEGATIVE NEGATIVE Final    Comment: (NOTE) SARS-CoV-2 target nucleic acids are NOT DETECTED.  The SARS-CoV-2 RNA is generally detectable in upper and lower respiratory specimens during the acute phase of infection. The lowest concentration of SARS-CoV-2 viral copies this assay can detect is 250 copies / mL. Harrold Fitchett negative result does not preclude SARS-CoV-2 infection and should not be used as the sole basis for treatment or other patient management  decisions.  Beatryce Colombo negative result may occur with improper specimen collection / handling, submission of specimen other than nasopharyngeal swab, presence of viral mutation(s) within the areas targeted by this assay, and inadequate number of viral copies (<250 copies / mL). Gennesis Hogland negative result must be combined with clinical observations, patient history, and epidemiological information.  Fact Sheet for Patients:   RoadLapTop.co.za  Fact Sheet for Healthcare Providers: http://kim-miller.com/  This test is not yet approved or  cleared by the [place] FDA and has been authorized for detection and/or diagnosis of SARS-CoV-2 by FDA under an Emergency Use Authorization (EUA).  This EUA will remain in effect (meaning this test can be used) for the duration of the COVID-19 declaration under Section 564(b)(1) of the Act, 21 U.S.C. section 360bbb-3(b)(1), unless the authorization is terminated or revoked sooner.  Performed at [facility], [address] [place], [place] [address]   Aerobic/Anaerobic Culture w Gram Stain (surgical/deep wound)     Status: None (Preliminary result)   Collection Time: 2023/10/10  8:56 PM   Specimen: Path fluid; Tissue  Result Value Ref Range Status   Specimen Description   Final    FLUID Performed at [facility], [address], [place], [place] [address]    Special Requests   Final    NONE Performed

## 2023-09-15 NOTE — Progress Notes (Signed)
   09/15/23 0008  BiPAP/CPAP/SIPAP  Reason BIPAP/CPAP not in use Non-compliant (pt refused. pt does not tolerate any type of mask on his face)

## 2023-09-15 NOTE — Progress Notes (Addendum)
Pharmacy Antibiotic Note  Corey Griffin is a 77 y.o. male admitted on October 03, 2023 with perforated gastric ulcer.  Pharmacy has been consulted for cefepime dosing.  D#6 abx - started on cefepime and flagyl 03-Oct-2023 s/p OR for ex lap with graham patch with plan for 5 days per surgery based on STOP-IT trial.   Surgical path with rare candidate albicans > IV fluconazole added and cefepime de-escalated to Unasyn 10/14. TPN initiated 10/14 >> S/p UGI 10/17 without evidence of contrast leak, NGT removed  O2 sat dropped to 78% on 4L [place] early this morning and placed on 15 L HFNC briefly with improvement in sats to 80s. Noted episode of emesis today as well. Afebrile, WBC 26 > 23.3, ANC 20.3 SCr 0.56  Plan: START cefepime 2g IV Q8h Flagyl 500 mg IV q12h per MD (added back 10/18 for aspiration coverage) Continue fluconazole 400mg  IV every 24 hours Monitor renal function, clinical status, ability to de-escalate, and DOT  Height: 5\' 9"  (175.3 cm) Weight: 66.1 kg (145 lb 11.6 oz) IBW/kg (Calculated) : 70.7  Temp (24hrs), Avg:98 F (36.7 C), Min:97.9 F (36.6 C), Max:98 F (36.7 C)  Recent Labs  Lab 10-03-23 1809 09/11/23 0011 09/11/23 0329 09/12/23 0750 09/12/23 0949 09/13/23 0514 09/14/23 0842 09/15/23 0601  WBC  --  24.5* 26.8*  --  24.6*  --  26.0* 23.3*  CREATININE  --  0.89 0.78 0.71 0.74 0.57* 0.60* 0.56*  LATICACIDVEN 1.2 1.8 1.7  --   --   --   --   --     Estimated Creatinine Clearance: 71.1 mL/min (A) (by C-G formula based on SCr of 0.56 mg/dL (L)).    No Known Allergies  Antimicrobials this admission: Cefepime 2023-10-03 > 10/14, 10/18 > c Flagyl 2023/10/03 > c Fluconazole 10/03/2023 > c Unasyn 10/14 > 10/18  Microbiology results: 2023-10-03 BCx: ngF 10-03-2023 Wound Cx:  rare candida albicans 10/14 sputum: abundant WBC (predomin PMH), rare GPCs in pairs   Thank you for allowing pharmacy to be a part of this patient's care.  Trixie Rude, PharmD Clinical Pharmacist 09/15/2023  3:14  PM

## 2023-09-15 NOTE — Progress Notes (Signed)
   09/14/23 2203  Assess: MEWS Score  Pulse Rate (!) 110  ECG Heart Rate (!) 111  Resp (!) 23  Level of Consciousness Alert  SpO2 (!) 83 %  O2 Device HFNC  Patient Activity (if Appropriate) In bed  O2 Flow Rate (L/min) 15 L/min  Assess: MEWS Score  MEWS Temp 0  MEWS Systolic 0  MEWS Pulse 2  MEWS RR 1  MEWS LOC 0  MEWS Score 3  MEWS Score Color Yellow  Assess: if the MEWS score is Yellow or Red  Were vital signs accurate and taken at a resting state? Yes  Does the patient meet 2 or more of the SIRS criteria? Yes  MEWS guidelines implemented  Yes, yellow  Treat  MEWS Interventions Considered administering scheduled or prn medications/treatments as ordered  Take Vital Signs  Increase Vital Sign Frequency  Yellow: Q2hr x1, continue Q4hrs until patient remains green for 12hrs  Escalate  MEWS: Escalate Yellow: Discuss with charge nurse and consider notifying provider and/or RRT  Notify: Charge Nurse/RN  Name of Charge Nurse/RN Notified Gavin Pound, RN  Provider Notification  Provider Name/Title Dr. Antionette Char  Date Provider Notified 09/14/23  Time Provider Notified 2203  Method of Notification Page (Secured chat)  Notification Reason Other (Comment) (O2sat going down to low 80s)  Provider response See new orders  Date of Provider Response 09/14/23  Time of Provider Response 2219  Notify: Rapid Response  Name of Rapid Response RN Notified Marcellina Millin, RN  Date Rapid Response Notified 09/14/23  Time Rapid Response Notified 2253  Assess: SIRS CRITERIA  SIRS Temperature  0  SIRS Pulse 1  SIRS Respirations  1  SIRS WBC 0  SIRS Score Sum  2   Patient complains of shortness of breath SPO2 going 78 to low 80s on 4L . Pt is tachypneic and labored. Initially placed on HFNC 15L. Called respiratory therapist and rapid response nurse. Notified Dr Antionette Char and ordered for CXR and BiPAP. Patient refusing of any type of mask on. Dr Antionette Char came to bedside to check on patient. See new orders.

## 2023-09-15 NOTE — Progress Notes (Signed)
Pt placed on HHFNC 40l 40%. Pt refused any type of mask in his face. Pt tolerating high flow well but sating only 89-91% despite  increasing  fio2.

## 2023-09-15 NOTE — Progress Notes (Signed)
Progress Note  5 Days Post-Op  Subjective: Patient laying in bed and very difficult to understand and struggles to answer my questions this morning.  Holding emesis bag, but doesn't know if he has been throwing up or nauseated.  RN does report some bilious emesis since NGT removal yesterday.  Objective: Vital signs in last 24 hours: Temp:  [97.9 F (36.6 C)-98 F (36.7 C)] 98 F (36.7 C) (10/17 2320) Pulse Rate:  [26-112] 101 (10/18 0859) Resp:  [17-31] 26 (10/18 0859) BP: (99-114)/(63-68) 105/63 (10/18 0450) SpO2:  [83 %-99 %] 93 % (10/18 0900) FiO2 (%):  [40 %] 40 % (10/18 0900) Last BM Date :  (Pt. cannot recall date)  Intake/Output from previous day: 10/17 0701 - 10/18 0700 In: 2002.7 [I.V.:809.9; IV Piggyback:1192.8] Out: 4330 [Urine:4200; Emesis/NG output:100; Drains:30] Intake/Output this shift: No intake/output data recorded.  PE: General: chronically ill appearing elderly  male who is laying in bed in NAD Heart: regular, rate, and rhythm Abd: soft, appropriately ttp, dressing C/D/I with staples present, JP with serous output   Lab Results:  Recent Labs    09/14/23 0842 09/15/23 0601  WBC 26.0* 23.3*  HGB 9.4* 10.4*  HCT 30.8* 33.6*  PLT 503* 510*   BMET Recent Labs    09/14/23 0842 09/15/23 0601  NA 141 143  K 3.8 3.5  CL 107 103  CO2 28 32  GLUCOSE 115* 142*  BUN 26* 26*  CREATININE 0.60* 0.56*  CALCIUM 8.7* 9.2   PT/INR No results for input(s): "LABPROT", "INR" in the last 72 hours.  CMP     Component Value Date/Time   NA 143 09/15/2023 0601   NA 149 (H) 02/07/2022 1604   K 3.5 09/15/2023 0601   CL 103 09/15/2023 0601   CO2 32 09/15/2023 0601   GLUCOSE 142 (H) 09/15/2023 0601   BUN 26 (H) 09/15/2023 0601   BUN 19 02/07/2022 1604   CREATININE 0.56 (L) 09/15/2023 0601   CALCIUM 9.2 09/15/2023 0601   PROT 5.7 (L) 09/15/2023 0601   ALBUMIN <1.5 (L) 09/15/2023 0601   AST 19 09/15/2023 0601   ALT 19 09/15/2023 0601   ALKPHOS 135 (H)  09/15/2023 0601   BILITOT 0.6 09/15/2023 0601   GFRNONAA >60 09/15/2023 0601   GFRAA >60 01/01/2017 0919   Lipase     Component Value Date/Time   LIPASE 20 09/27/2023 1640       Studies/Results: DG CHEST PORT 1 VIEW  Result Date: 09/14/2023 CLINICAL DATA:  [id number] with acute and chronic respiratory failure with hypoxia. EXAM: PORTABLE CHEST 1 VIEW COMPARISON:  Portable chest 09/07/2023 FINDINGS: 11:02 p.m. NGT/ETT interval removal. Right PICC interval insertion the tip in the mid to distal SVC. No pneumothorax is seen. The heart is slightly enlarged. The vascular markings appear normal caliber. Interstitial and patchy increased hazy opacities are noted today in the lower lung fields, on the right associated with a new small layering pleural effusion. Findings could be due to edema and/or pneumonia. There is a coarse interstitial infiltrate in the peripheral left upper lobe which was not seen on a 2022 chest x-ray. There is a suspected 2.6 cm cavity within this opacity. Findings could be infectious, post inflammatory or neoplastic. Rest of the lungs appear clear with COPD. Stable mediastinum with aortic tortuosity and calcification. The patient obliquely rotated to the right. Osteopenia and thoracic spondylosis with no new osseous findings. IMPRESSION: 1. New bilateral lower zonal interstitial and patchy hazy opacities with small layering right  pleural effusion. Findings could be due to edema and/or pneumonia. 2. Again noted fairly broad-based coarse interstitial infiltrate in the peripheral left upper lobe with a suspected underlying 2.6 cm cavity. Findings could be infectious, post inflammatory or neoplastic and were not seen on the last chest x-ray on 04/02/2021. Consider follow-up chest CT for further evaluation, preferably with contrast. 3. COPD. 4. Aortic atherosclerosis. Electronically Signed   By: Almira Bar M.D.   On: 09/14/2023 23:22   DG UGI W SINGLE CM (SOL OR THIN BA)  Result  Date: 09/14/2023 CLINICAL DATA:  Patient with a history of perforated gastric ulcer status post exploratory laparotomy and Graham patch. EXAM: DG UGI W SINGLE CM TECHNIQUE: Scout radiograph was obtained. Single contrast examination was performed using water soluble contrast administered via NG tube. Study is limited by the patient's limited mobility. Examination was performed in the supine and right lateral decubitus positions. This exam was performed by Alwyn Ren, NP, and was supervised and interpreted by Dr. Purcell Mouton. FLUOROSCOPY: Radiation Exposure Index (as provided by the fluoroscopic device): 34.30 mGy Kerma COMPARISON:  Radiographs 2023-09-26.  Preoperative CT 26-Sep-2023. FINDINGS: Scout Radiograph: Enteric tube projects over the mid stomach. There is a surgical drain in the right upper quadrant. Gastroesophageal reflux:  None visualized. Stomach: No gastric ulcer or mucosal abnormality identified. No evidence of contrast leak. Gastric emptying: Normal. Duodenum:  Normal appearance. Other: No evidence of contrast leak. IMPRESSION: 1. No evidence of contrast leak following recent surgical repair of a perforated gastric ulcer. 2. No gastric outlet obstruction or focal small bowel abnormality identified. Electronically Signed   By: Carey Bullocks M.D.   On: 09/14/2023 10:39    Anti-infectives: Anti-infectives (From admission, onward)    Start     Dose/Rate Route Frequency Ordered Stop   09/11/23 2345  fluconazole (DIFLUCAN) IVPB 400 mg       Note to Pharmacy: Attention pharmacist: For 800 mg doses, change infusion duration to 240 min in verifcation via edit clinical information.  Placed in "Followed by" Linked Group   400 mg 100 mL/hr over 120 Minutes Intravenous Every 24 hours 26-Sep-2023 2318     09/11/23 1245  metroNIDAZOLE (FLAGYL) IVPB 500 mg  Status:  Discontinued        500 mg 100 mL/hr over 60 Minutes Intravenous Every 12 hours 09/11/23 1148 09/14/23 1450   09/11/23 1245   Ampicillin-Sulbactam (UNASYN) 3 g in sodium chloride 0.9 % 100 mL IVPB        3 g 200 mL/hr over 30 Minutes Intravenous Every 6 hours 09/11/23 1148     09/11/23 0600  cefoTEtan (CEFOTAN) 2 g in sodium chloride 0.9 % 100 mL IVPB        2 g 200 mL/hr over 30 Minutes Intravenous On call to O.R. 26-Sep-2023 2007 09/26/2023 2127   09/11/23 0000  azithromycin (ZITHROMAX) 500 mg in sodium chloride 0.9 % 250 mL IVPB  Status:  Discontinued        500 mg 250 mL/hr over 60 Minutes Intravenous Every 24 hours 09-26-2023 2330 09/11/23 1148   2023/09/26 2330  fluconazole (DIFLUCAN) IVPB 800 mg       Note to Pharmacy: Attention pharmacist: For 800 mg doses, change infusion duration to 240 min in verifcation via edit clinical information.  Placed in "Followed by" Linked Group   800 mg 100 mL/hr over 240 Minutes Intravenous  Once 26-Sep-2023 2318 09/11/23 0409   09/26/2023 2019  sodium chloride 0.9 % with cefoTEtan (CEFOTAN) ADS Med  pleural effusion. Findings could be due to edema and/or pneumonia. 2. Again noted fairly broad-based coarse interstitial infiltrate in the peripheral left upper lobe with a suspected underlying 2.6 cm cavity. Findings could be infectious, post inflammatory or neoplastic and were not seen on the last chest x-ray on 04/02/2021. Consider follow-up chest CT for further evaluation, preferably with contrast. 3. COPD. 4. Aortic atherosclerosis. Electronically Signed   By: Almira Bar M.D.   On: 09/14/2023 23:22   DG UGI W SINGLE CM (SOL OR THIN BA)  Result  Date: 09/14/2023 CLINICAL DATA:  Patient with a history of perforated gastric ulcer status post exploratory laparotomy and Graham patch. EXAM: DG UGI W SINGLE CM TECHNIQUE: Scout radiograph was obtained. Single contrast examination was performed using water soluble contrast administered via NG tube. Study is limited by the patient's limited mobility. Examination was performed in the supine and right lateral decubitus positions. This exam was performed by Alwyn Ren, NP, and was supervised and interpreted by Dr. Purcell Mouton. FLUOROSCOPY: Radiation Exposure Index (as provided by the fluoroscopic device): 34.30 mGy Kerma COMPARISON:  Radiographs 2023-09-26.  Preoperative CT 26-Sep-2023. FINDINGS: Scout Radiograph: Enteric tube projects over the mid stomach. There is a surgical drain in the right upper quadrant. Gastroesophageal reflux:  None visualized. Stomach: No gastric ulcer or mucosal abnormality identified. No evidence of contrast leak. Gastric emptying: Normal. Duodenum:  Normal appearance. Other: No evidence of contrast leak. IMPRESSION: 1. No evidence of contrast leak following recent surgical repair of a perforated gastric ulcer. 2. No gastric outlet obstruction or focal small bowel abnormality identified. Electronically Signed   By: Carey Bullocks M.D.   On: 09/14/2023 10:39    Anti-infectives: Anti-infectives (From admission, onward)    Start     Dose/Rate Route Frequency Ordered Stop   09/11/23 2345  fluconazole (DIFLUCAN) IVPB 400 mg       Note to Pharmacy: Attention pharmacist: For 800 mg doses, change infusion duration to 240 min in verifcation via edit clinical information.  Placed in "Followed by" Linked Group   400 mg 100 mL/hr over 120 Minutes Intravenous Every 24 hours 26-Sep-2023 2318     09/11/23 1245  metroNIDAZOLE (FLAGYL) IVPB 500 mg  Status:  Discontinued        500 mg 100 mL/hr over 60 Minutes Intravenous Every 12 hours 09/11/23 1148 09/14/23 1450   09/11/23 1245   Ampicillin-Sulbactam (UNASYN) 3 g in sodium chloride 0.9 % 100 mL IVPB        3 g 200 mL/hr over 30 Minutes Intravenous Every 6 hours 09/11/23 1148     09/11/23 0600  cefoTEtan (CEFOTAN) 2 g in sodium chloride 0.9 % 100 mL IVPB        2 g 200 mL/hr over 30 Minutes Intravenous On call to O.R. 26-Sep-2023 2007 09/26/2023 2127   09/11/23 0000  azithromycin (ZITHROMAX) 500 mg in sodium chloride 0.9 % 250 mL IVPB  Status:  Discontinued        500 mg 250 mL/hr over 60 Minutes Intravenous Every 24 hours 09-26-2023 2330 09/11/23 1148   2023/09/26 2330  fluconazole (DIFLUCAN) IVPB 800 mg       Note to Pharmacy: Attention pharmacist: For 800 mg doses, change infusion duration to 240 min in verifcation via edit clinical information.  Placed in "Followed by" Linked Group   800 mg 100 mL/hr over 240 Minutes Intravenous  Once 26-Sep-2023 2318 09/11/23 0409   09/26/2023 2019  sodium chloride 0.9 % with cefoTEtan (CEFOTAN) ADS Med

## 2023-09-16 ENCOUNTER — Inpatient Hospital Stay (HOSPITAL_COMMUNITY): Payer: 59

## 2023-09-16 DIAGNOSIS — Z515 Encounter for palliative care: Secondary | ICD-10-CM | POA: Diagnosis not present

## 2023-09-16 DIAGNOSIS — Z7189 Other specified counseling: Secondary | ICD-10-CM | POA: Diagnosis not present

## 2023-09-16 DIAGNOSIS — J90 Pleural effusion, not elsewhere classified: Secondary | ICD-10-CM

## 2023-09-16 DIAGNOSIS — J9601 Acute respiratory failure with hypoxia: Secondary | ICD-10-CM | POA: Diagnosis not present

## 2023-09-16 DIAGNOSIS — K251 Acute gastric ulcer with perforation: Secondary | ICD-10-CM | POA: Diagnosis not present

## 2023-09-16 DIAGNOSIS — J9602 Acute respiratory failure with hypercapnia: Secondary | ICD-10-CM

## 2023-09-16 DIAGNOSIS — E43 Unspecified severe protein-calorie malnutrition: Secondary | ICD-10-CM | POA: Diagnosis not present

## 2023-09-16 LAB — COMPREHENSIVE METABOLIC PANEL
ALT: 19 U/L (ref 0–44)
AST: 19 U/L (ref 15–41)
Albumin: <1.5 g/dL — ABNORMAL LOW (ref 3.5–5.0)
Alkaline Phosphatase: 163 U/L — ABNORMAL HIGH (ref 38–126)
Anion gap: 6 (ref 5–15)
BUN: 33 mg/dL — ABNORMAL HIGH (ref 8–23)
CO2: 33 mmol/L — ABNORMAL HIGH (ref 22–32)
Calcium: 8.3 mg/dL — ABNORMAL LOW (ref 8.9–10.3)
Chloride: 104 mmol/L (ref 98–111)
Creatinine, Ser: 0.62 mg/dL (ref 0.61–1.24)
GFR, Estimated: >60 mL/min (ref >60)
Glucose, Bld: 139 mg/dL — ABNORMAL HIGH (ref 70–99)
Potassium: 4.2 mmol/L (ref 3.5–5.1)
Sodium: 143 mmol/L (ref 135–145)
Total Bilirubin: 0.1 mg/dL — ABNORMAL LOW (ref 0.3–1.2)
Total Protein: 5.3 g/dL — ABNORMAL LOW (ref 6.5–8.1)

## 2023-09-16 LAB — AEROBIC/ANAEROBIC CULTURE W GRAM STAIN (SURGICAL/DEEP WOUND): Gram Stain: NONE SEEN

## 2023-09-16 LAB — GLUCOSE, CAPILLARY: Glucose-Capillary: 127 mg/dL — ABNORMAL HIGH (ref 70–99)

## 2023-09-16 LAB — BLOOD GAS, VENOUS
Acid-Base Excess: 8.4 mmol/L — ABNORMAL HIGH (ref 0.0–2.0)
Bicarbonate: 36.9 mmol/L — ABNORMAL HIGH (ref 20.0–28.0)
O2 Saturation: 76.9 %
Patient temperature: 37
pCO2, Ven: 75 mm[Hg] (ref 44–60)
pH, Ven: 7.3 (ref 7.25–7.43)
pO2, Ven: 47 mm[Hg] — ABNORMAL HIGH (ref 32–45)

## 2023-09-16 LAB — MAGNESIUM: Magnesium: 1.9 mg/dL (ref 1.7–2.4)

## 2023-09-16 LAB — PHOSPHORUS: Phosphorus: 2.5 mg/dL (ref 2.5–4.6)

## 2023-09-16 LAB — BRAIN NATRIURETIC PEPTIDE: B Natriuretic Peptide: 276.4 pg/mL — ABNORMAL HIGH (ref 0.0–100.0)

## 2023-09-16 MED ORDER — OXYMETAZOLINE HCL 0.05 % NA SOLN
2.0000 | Freq: Two times a day (BID) | NASAL | Status: DC | PRN
  Filled 2023-09-16: qty 30
  Filled 2023-09-16: qty 15

## 2023-09-16 MED ORDER — TRACE MINERALS CU-MN-SE-ZN 300-55-60-3000 MCG/ML IV SOLN
INTRAVENOUS | Status: AC
  Filled 2023-09-16: qty 2000

## 2023-09-16 MED ORDER — FAT EMUL FISH OIL/PLANT BASED 20% (SMOFLIPID)IV EMUL
250.0000 mL | INTRAVENOUS | Status: AC
  Administered 2023-09-16: 250 mL via INTRAVENOUS
  Filled 2023-09-16: qty 250

## 2023-09-16 MED ORDER — SODIUM PHOSPHATES 45 MMOLE/15ML IV SOLN
15.0000 mmol | Freq: Once | INTRAVENOUS | Status: AC
  Administered 2023-09-16: 15 mmol via INTRAVENOUS
  Filled 2023-09-16: qty 5

## 2023-09-16 MED ORDER — SORBITOL 70 % SOLN
960.0000 mL | TOPICAL_OIL | Freq: Once | ORAL | Status: DC
  Filled 2023-09-16: qty 240

## 2023-09-16 MED ORDER — MAGNESIUM SULFATE 2 GM/50ML IV SOLN
2.0000 g | Freq: Once | INTRAVENOUS | Status: AC
  Administered 2023-09-16: 2 g via INTRAVENOUS
  Filled 2023-09-16: qty 50

## 2023-09-16 NOTE — Progress Notes (Signed)
PROGRESS NOTE    Corey Griffin  ZOX:[id number] DOB: [birth year] DOA: 09-26-23 PCP: Benita Stabile, MD  Chief Complaint  Patient presents with   bowe blockage    Brief Narrative:   Patient is Corey Griffin  77 yo M w/ pertinent PMH COPD on o2(seen by Dr. Sherene Sires), chronic pain, chronic constipation presents to [facility] ED on September 26, 2023 w/ abd distention and pain.   Patient states he has had abd distention, constipation, and pain for several days. Uanble to eat or drink due to the pain. Denies any vomiting or diarrhea. Has hx of severe abd wall thinning. Concerned he has Jeralyn Nolden blockage and came to [facility] on 09/26/2023 for further workup. On arrival patient tachycardic 110s, afebrile, bp stable. Abd distended w/ pain on palpation. WBC 16. Cultures obtained and started on cefepime/flagyl. UA negative. Lipase wnl. CT abd/pelvis showing free air in stomach excision for perforated gastric ulcer. Surgery consulted. Taken to OR for ex lap with gastric ulcer repair, biopsy, graham patch placement. JP drain in place. Given hx of COPD plan to keep post op intubated overnight and transfer to [facility] ICU. PCCM consulted for transfer.   Significant Events 09-26-23 admit to [facility] for gastric ulcer w/ free air in abd; taking to OR; post op remains intubated transfer to [facility]; pccm consulted 10/14 Extubated TRH assumed care on 10/16 10/17 PO trial of clears after UGI without evidence of contrast leak 10/17-18 overnight declined with increasing SOB and increasing O2 requirement -> lasix ordered 10/18 am greenish vomit, made NPO again  Assessment & Plan:   Principal Problem:   Acute gastric ulcer with perforation (HCC) Active Problems:   Sepsis (HCC)   Bowel perforation (HCC)   Gastric ulcer with perforation (HCC)   Acute respiratory failure (HCC)   Protein-calorie malnutrition, severe   DNR (do not resuscitate)   DNI (do not intubate)   Pleural effusion   Goals of care Will ask palliative to see him.    Perforated Gastric Ulcer s/p Ex Lap  with Cheree Ditto Patch S/p OR 09-26-2023 with general surgery Appreciate surgery recs - NPO in setting of N/V 10/18 am. UGI 10/17 without evidence of contrast leak - follow pending path/culture (reactive gastropathy showing activity, ulceration, foveolar hyperplasia, and intestinal metaplasia.  Helicobacter stain negative.  Negative for dysplasia and carcinoma).  Culture with rare candida albicans.   Continue TPN per surgery Continue cefepime/flagyl//fluconazole    Acute Hypoxic Respiratory Failure  Due to aspiration pneumonia and volume overload as noted below   Aspiration Pneumonia Right Pleural Effusion COPD  Currently on 40 L HHFNC, 35% FiO2 Broaden to cefepime/flagyl with decline CXR 09/26/23 with increasing R base atelectasis or infiltrate  Follow, continue brovana, pulmicort, yuperlri and prn albuterol  On spiriva at home Respiratory culture with gram positive cocci in pairs (10/14) Negative MRSA PCR No clear wheezing on exam to suggest addition of steroids at this point Appreciate pulm assistance -> recommending consider CT chest 10/19 or 10/20, they'll follow daily POCUS of effusion   Volume Overload  Pulmonary Edema CXR with bilateral lower zonal interstitial and patchy hazy opacities with small layering R effusion (edema vs pneumonia) Clinically overloaded with dependent hip edema  Continue diuresis with 60 mg lasix BID Follow echo -> EF 55-60%  Strict I/O (net positive 2 L), daily weights   Left Upper Lobe Opacities Suspected 2.6 cm Cavity  Unclear etiology Not currently stable for CT with contrast due to resp status Will defer to pulm pulmonology   Leukocytosis Due  Lab, [address], [place], [place] [address]    Culture   Final    RARE CANDIDA ALBICANS RARE GLAND NO ANAEROBES ISOLATED; CULTURE IN PROGRESS FOR 5 DAYS    Report Status PENDING  Incomplete  MRSA Next Gen by PCR, Nasal     Status: None   Collection Time: 09/14/2023 11:17 PM  Result Value Ref Range Status   MRSA by PCR Next Gen NOT DETECTED NOT DETECTED Final    Comment: (NOTE) The GeneXpert MRSA Assay (FDA approved for NASAL specimens only), is one component of [place] comprehensive MRSA colonization surveillance program. It is not intended to diagnose MRSA infection nor to guide or monitor treatment for MRSA infections. Test performance is not FDA approved in patients less than 41 years old. Performed at  [facility], [address], [place], [place] [address]   Culture, Respiratory w Gram Stain     Status: None   Collection Time: 09/11/23 12:30 AM   Specimen: Sputum  Result Value Ref Range Status   Specimen Description SPU  Final   Special Requests NONE  Final   Gram Stain   Final    ABUNDANT WBC PRESENT, PREDOMINANTLY PMN RARE GRAM POSITIVE COCCI IN PAIRS    Culture   Final    RARE Normal respiratory flora-no Staph aureus or Pseudomonas seen Performed at [facility], [address], [place], [place] [address]    Report Status 09/15/2023 FINAL  Final         Radiology Studies: ECHOCARDIOGRAM COMPLETE  Result Date: 09/15/2023    ECHOCARDIOGRAM REPORT   Patient Name:   Corey Griffin Date of Exam: 09/15/2023 Medical Rec #:  [id number]     Height:       69.0 in Accession #:    [id number]    Weight:       145.7 lb Date of Birth:  1946-01-02      BSA:          1.806 m Patient Age:    77 years      BP:           105/63 mmHg Patient Gender: M             HR:           115 bpm. Exam Location:  Inpatient Procedure: 2D Echo, Cardiac Doppler and Color Doppler Indications:    Pulmonary Edema  History:        Patient has no prior history of Echocardiogram examinations.                 COPD.  Sonographer:    Darlys Gales Referring Phys: [phone] Sophiamarie Nease CALDWELL POWELL JR  Sonographer Comments: Technically difficult study due to poor echo windows. TDS due to patient being rolled on his right side. IMPRESSIONS  1. Tehcnically difficult study with very limited views. Left ventricular ejection fraction, by estimation, is 55 to 60%. The left ventricle has grossly normal function. Left ventricular endocardial border not optimally defined to evaluate regional wall motion. Left ventricular diastolic parameters are indeterminate.  2. Right ventricular systolic function was not well visualized. The right ventricular size is not well visualized. Tricuspid regurgitation signal is inadequate for assessing PA  pressure.  3. The mitral valve is grossly normal. Trivial mitral valve regurgitation. No evidence of mitral stenosis.  4. The aortic valve was not well visualized. Aortic valve regurgitation is not visualized. No aortic stenosis is present. FINDINGS  Left Ventricle: Left  Lab, [address], [place], [place] [address]    Culture   Final    RARE CANDIDA ALBICANS RARE GLAND NO ANAEROBES ISOLATED; CULTURE IN PROGRESS FOR 5 DAYS    Report Status PENDING  Incomplete  MRSA Next Gen by PCR, Nasal     Status: None   Collection Time: 09/14/2023 11:17 PM  Result Value Ref Range Status   MRSA by PCR Next Gen NOT DETECTED NOT DETECTED Final    Comment: (NOTE) The GeneXpert MRSA Assay (FDA approved for NASAL specimens only), is one component of [place] comprehensive MRSA colonization surveillance program. It is not intended to diagnose MRSA infection nor to guide or monitor treatment for MRSA infections. Test performance is not FDA approved in patients less than 41 years old. Performed at  [facility], [address], [place], [place] [address]   Culture, Respiratory w Gram Stain     Status: None   Collection Time: 09/11/23 12:30 AM   Specimen: Sputum  Result Value Ref Range Status   Specimen Description SPU  Final   Special Requests NONE  Final   Gram Stain   Final    ABUNDANT WBC PRESENT, PREDOMINANTLY PMN RARE GRAM POSITIVE COCCI IN PAIRS    Culture   Final    RARE Normal respiratory flora-no Staph aureus or Pseudomonas seen Performed at [facility], [address], [place], [place] [address]    Report Status 09/15/2023 FINAL  Final         Radiology Studies: ECHOCARDIOGRAM COMPLETE  Result Date: 09/15/2023    ECHOCARDIOGRAM REPORT   Patient Name:   Corey Griffin Date of Exam: 09/15/2023 Medical Rec #:  [id number]     Height:       69.0 in Accession #:    [id number]    Weight:       145.7 lb Date of Birth:  1946-01-02      BSA:          1.806 m Patient Age:    77 years      BP:           105/63 mmHg Patient Gender: M             HR:           115 bpm. Exam Location:  Inpatient Procedure: 2D Echo, Cardiac Doppler and Color Doppler Indications:    Pulmonary Edema  History:        Patient has no prior history of Echocardiogram examinations.                 COPD.  Sonographer:    Darlys Gales Referring Phys: [phone] Sophiamarie Nease CALDWELL POWELL JR  Sonographer Comments: Technically difficult study due to poor echo windows. TDS due to patient being rolled on his right side. IMPRESSIONS  1. Tehcnically difficult study with very limited views. Left ventricular ejection fraction, by estimation, is 55 to 60%. The left ventricle has grossly normal function. Left ventricular endocardial border not optimally defined to evaluate regional wall motion. Left ventricular diastolic parameters are indeterminate.  2. Right ventricular systolic function was not well visualized. The right ventricular size is not well visualized. Tricuspid regurgitation signal is inadequate for assessing PA  pressure.  3. The mitral valve is grossly normal. Trivial mitral valve regurgitation. No evidence of mitral stenosis.  4. The aortic valve was not well visualized. Aortic valve regurgitation is not visualized. No aortic stenosis is present. FINDINGS  Left Ventricle: Left  PROGRESS NOTE    Corey Griffin  ZOX:[id number] DOB: [birth year] DOA: 09-26-23 PCP: Benita Stabile, MD  Chief Complaint  Patient presents with   bowe blockage    Brief Narrative:   Patient is Corey Griffin  77 yo M w/ pertinent PMH COPD on o2(seen by Dr. Sherene Sires), chronic pain, chronic constipation presents to [facility] ED on September 26, 2023 w/ abd distention and pain.   Patient states he has had abd distention, constipation, and pain for several days. Uanble to eat or drink due to the pain. Denies any vomiting or diarrhea. Has hx of severe abd wall thinning. Concerned he has Jeralyn Nolden blockage and came to [facility] on 09/26/2023 for further workup. On arrival patient tachycardic 110s, afebrile, bp stable. Abd distended w/ pain on palpation. WBC 16. Cultures obtained and started on cefepime/flagyl. UA negative. Lipase wnl. CT abd/pelvis showing free air in stomach excision for perforated gastric ulcer. Surgery consulted. Taken to OR for ex lap with gastric ulcer repair, biopsy, graham patch placement. JP drain in place. Given hx of COPD plan to keep post op intubated overnight and transfer to [facility] ICU. PCCM consulted for transfer.   Significant Events 09-26-23 admit to [facility] for gastric ulcer w/ free air in abd; taking to OR; post op remains intubated transfer to [facility]; pccm consulted 10/14 Extubated TRH assumed care on 10/16 10/17 PO trial of clears after UGI without evidence of contrast leak 10/17-18 overnight declined with increasing SOB and increasing O2 requirement -> lasix ordered 10/18 am greenish vomit, made NPO again  Assessment & Plan:   Principal Problem:   Acute gastric ulcer with perforation (HCC) Active Problems:   Sepsis (HCC)   Bowel perforation (HCC)   Gastric ulcer with perforation (HCC)   Acute respiratory failure (HCC)   Protein-calorie malnutrition, severe   DNR (do not resuscitate)   DNI (do not intubate)   Pleural effusion   Goals of care Will ask palliative to see him.    Perforated Gastric Ulcer s/p Ex Lap  with Cheree Ditto Patch S/p OR 09-26-2023 with general surgery Appreciate surgery recs - NPO in setting of N/V 10/18 am. UGI 10/17 without evidence of contrast leak - follow pending path/culture (reactive gastropathy showing activity, ulceration, foveolar hyperplasia, and intestinal metaplasia.  Helicobacter stain negative.  Negative for dysplasia and carcinoma).  Culture with rare candida albicans.   Continue TPN per surgery Continue cefepime/flagyl//fluconazole    Acute Hypoxic Respiratory Failure  Due to aspiration pneumonia and volume overload as noted below   Aspiration Pneumonia Right Pleural Effusion COPD  Currently on 40 L HHFNC, 35% FiO2 Broaden to cefepime/flagyl with decline CXR 09/26/23 with increasing R base atelectasis or infiltrate  Follow, continue brovana, pulmicort, yuperlri and prn albuterol  On spiriva at home Respiratory culture with gram positive cocci in pairs (10/14) Negative MRSA PCR No clear wheezing on exam to suggest addition of steroids at this point Appreciate pulm assistance -> recommending consider CT chest 10/19 or 10/20, they'll follow daily POCUS of effusion   Volume Overload  Pulmonary Edema CXR with bilateral lower zonal interstitial and patchy hazy opacities with small layering R effusion (edema vs pneumonia) Clinically overloaded with dependent hip edema  Continue diuresis with 60 mg lasix BID Follow echo -> EF 55-60%  Strict I/O (net positive 2 L), daily weights   Left Upper Lobe Opacities Suspected 2.6 cm Cavity  Unclear etiology Not currently stable for CT with contrast due to resp status Will defer to pulm pulmonology   Leukocytosis Due  Lab, [address], [place], [place] [address]    Culture   Final    RARE CANDIDA ALBICANS RARE GLAND NO ANAEROBES ISOLATED; CULTURE IN PROGRESS FOR 5 DAYS    Report Status PENDING  Incomplete  MRSA Next Gen by PCR, Nasal     Status: None   Collection Time: 09/14/2023 11:17 PM  Result Value Ref Range Status   MRSA by PCR Next Gen NOT DETECTED NOT DETECTED Final    Comment: (NOTE) The GeneXpert MRSA Assay (FDA approved for NASAL specimens only), is one component of [place] comprehensive MRSA colonization surveillance program. It is not intended to diagnose MRSA infection nor to guide or monitor treatment for MRSA infections. Test performance is not FDA approved in patients less than 41 years old. Performed at  [facility], [address], [place], [place] [address]   Culture, Respiratory w Gram Stain     Status: None   Collection Time: 09/11/23 12:30 AM   Specimen: Sputum  Result Value Ref Range Status   Specimen Description SPU  Final   Special Requests NONE  Final   Gram Stain   Final    ABUNDANT WBC PRESENT, PREDOMINANTLY PMN RARE GRAM POSITIVE COCCI IN PAIRS    Culture   Final    RARE Normal respiratory flora-no Staph aureus or Pseudomonas seen Performed at [facility] Lab, [address], [place], [place] [address]    Report Status 09/15/2023 FINAL  Final         Radiology Studies: ECHOCARDIOGRAM COMPLETE  Result Date: 09/15/2023    ECHOCARDIOGRAM REPORT   Patient Name:   Corey Griffin Date of Exam: 09/15/2023 Medical Rec #:  [id number]     Height:       69.0 in Accession #:    [id number]    Weight:       145.7 lb Date of Birth:  1946-01-02      BSA:          1.806 m Patient Age:    77 years      BP:           105/63 mmHg Patient Gender: M             HR:           115 bpm. Exam Location:  Inpatient Procedure: 2D Echo, Cardiac Doppler and Color Doppler Indications:    Pulmonary Edema  History:        Patient has no prior history of Echocardiogram examinations.                 COPD.  Sonographer:    Darlys Gales Referring Phys: [phone] Sophiamarie Nease CALDWELL POWELL JR  Sonographer Comments: Technically difficult study due to poor echo windows. TDS due to patient being rolled on his right side. IMPRESSIONS  1. Tehcnically difficult study with very limited views. Left ventricular ejection fraction, by estimation, is 55 to 60%. The left ventricle has grossly normal function. Left ventricular endocardial border not optimally defined to evaluate regional wall motion. Left ventricular diastolic parameters are indeterminate.  2. Right ventricular systolic function was not well visualized. The right ventricular size is not well visualized. Tricuspid regurgitation signal is inadequate for assessing PA  pressure.  3. The mitral valve is grossly normal. Trivial mitral valve regurgitation. No evidence of mitral stenosis.  4. The aortic valve was not well visualized. Aortic valve regurgitation is not visualized. No aortic stenosis is present. FINDINGS  Left Ventricle: Left  Lab, [address], [place], [place] [address]    Culture   Final    RARE CANDIDA ALBICANS RARE GLAND NO ANAEROBES ISOLATED; CULTURE IN PROGRESS FOR 5 DAYS    Report Status PENDING  Incomplete  MRSA Next Gen by PCR, Nasal     Status: None   Collection Time: 09/14/2023 11:17 PM  Result Value Ref Range Status   MRSA by PCR Next Gen NOT DETECTED NOT DETECTED Final    Comment: (NOTE) The GeneXpert MRSA Assay (FDA approved for NASAL specimens only), is one component of [place] comprehensive MRSA colonization surveillance program. It is not intended to diagnose MRSA infection nor to guide or monitor treatment for MRSA infections. Test performance is not FDA approved in patients less than 41 years old. Performed at  [facility], [address], [place], [place] [address]   Culture, Respiratory w Gram Stain     Status: None   Collection Time: 09/11/23 12:30 AM   Specimen: Sputum  Result Value Ref Range Status   Specimen Description SPU  Final   Special Requests NONE  Final   Gram Stain   Final    ABUNDANT WBC PRESENT, PREDOMINANTLY PMN RARE GRAM POSITIVE COCCI IN PAIRS    Culture   Final    RARE Normal respiratory flora-no Staph aureus or Pseudomonas seen Performed at [facility] Lab, [address], [place], [place] [address]    Report Status 09/15/2023 FINAL  Final         Radiology Studies: ECHOCARDIOGRAM COMPLETE  Result Date: 09/15/2023    ECHOCARDIOGRAM REPORT   Patient Name:   Corey Griffin Date of Exam: 09/15/2023 Medical Rec #:  [id number]     Height:       69.0 in Accession #:    [id number]    Weight:       145.7 lb Date of Birth:  1946-01-02      BSA:          1.806 m Patient Age:    77 years      BP:           105/63 mmHg Patient Gender: M             HR:           115 bpm. Exam Location:  Inpatient Procedure: 2D Echo, Cardiac Doppler and Color Doppler Indications:    Pulmonary Edema  History:        Patient has no prior history of Echocardiogram examinations.                 COPD.  Sonographer:    Darlys Gales Referring Phys: [phone] Sophiamarie Nease CALDWELL POWELL JR  Sonographer Comments: Technically difficult study due to poor echo windows. TDS due to patient being rolled on his right side. IMPRESSIONS  1. Tehcnically difficult study with very limited views. Left ventricular ejection fraction, by estimation, is 55 to 60%. The left ventricle has grossly normal function. Left ventricular endocardial border not optimally defined to evaluate regional wall motion. Left ventricular diastolic parameters are indeterminate.  2. Right ventricular systolic function was not well visualized. The right ventricular size is not well visualized. Tricuspid regurgitation signal is inadequate for assessing PA  pressure.  3. The mitral valve is grossly normal. Trivial mitral valve regurgitation. No evidence of mitral stenosis.  4. The aortic valve was not well visualized. Aortic valve regurgitation is not visualized. No aortic stenosis is present. FINDINGS  Left Ventricle: Left  PROGRESS NOTE    Corey Griffin  ZOX:[id number] DOB: [birth year] DOA: 09-26-23 PCP: Benita Stabile, MD  Chief Complaint  Patient presents with   bowe blockage    Brief Narrative:   Patient is Corey Griffin  77 yo M w/ pertinent PMH COPD on o2(seen by Dr. Sherene Sires), chronic pain, chronic constipation presents to [facility] ED on September 26, 2023 w/ abd distention and pain.   Patient states he has had abd distention, constipation, and pain for several days. Uanble to eat or drink due to the pain. Denies any vomiting or diarrhea. Has hx of severe abd wall thinning. Concerned he has Jeralyn Nolden blockage and came to [facility] on 09/26/2023 for further workup. On arrival patient tachycardic 110s, afebrile, bp stable. Abd distended w/ pain on palpation. WBC 16. Cultures obtained and started on cefepime/flagyl. UA negative. Lipase wnl. CT abd/pelvis showing free air in stomach excision for perforated gastric ulcer. Surgery consulted. Taken to OR for ex lap with gastric ulcer repair, biopsy, graham patch placement. JP drain in place. Given hx of COPD plan to keep post op intubated overnight and transfer to [facility] ICU. PCCM consulted for transfer.   Significant Events 09-26-23 admit to [facility] for gastric ulcer w/ free air in abd; taking to OR; post op remains intubated transfer to [facility]; pccm consulted 10/14 Extubated TRH assumed care on 10/16 10/17 PO trial of clears after UGI without evidence of contrast leak 10/17-18 overnight declined with increasing SOB and increasing O2 requirement -> lasix ordered 10/18 am greenish vomit, made NPO again  Assessment & Plan:   Principal Problem:   Acute gastric ulcer with perforation (HCC) Active Problems:   Sepsis (HCC)   Bowel perforation (HCC)   Gastric ulcer with perforation (HCC)   Acute respiratory failure (HCC)   Protein-calorie malnutrition, severe   DNR (do not resuscitate)   DNI (do not intubate)   Pleural effusion   Goals of care Will ask palliative to see him.    Perforated Gastric Ulcer s/p Ex Lap  with Cheree Ditto Patch S/p OR 09-26-2023 with general surgery Appreciate surgery recs - NPO in setting of N/V 10/18 am. UGI 10/17 without evidence of contrast leak - follow pending path/culture (reactive gastropathy showing activity, ulceration, foveolar hyperplasia, and intestinal metaplasia.  Helicobacter stain negative.  Negative for dysplasia and carcinoma).  Culture with rare candida albicans.   Continue TPN per surgery Continue cefepime/flagyl//fluconazole    Acute Hypoxic Respiratory Failure  Due to aspiration pneumonia and volume overload as noted below   Aspiration Pneumonia Right Pleural Effusion COPD  Currently on 40 L HHFNC, 35% FiO2 Broaden to cefepime/flagyl with decline CXR 09/26/23 with increasing R base atelectasis or infiltrate  Follow, continue brovana, pulmicort, yuperlri and prn albuterol  On spiriva at home Respiratory culture with gram positive cocci in pairs (10/14) Negative MRSA PCR No clear wheezing on exam to suggest addition of steroids at this point Appreciate pulm assistance -> recommending consider CT chest 10/19 or 10/20, they'll follow daily POCUS of effusion   Volume Overload  Pulmonary Edema CXR with bilateral lower zonal interstitial and patchy hazy opacities with small layering R effusion (edema vs pneumonia) Clinically overloaded with dependent hip edema  Continue diuresis with 60 mg lasix BID Follow echo -> EF 55-60%  Strict I/O (net positive 2 L), daily weights   Left Upper Lobe Opacities Suspected 2.6 cm Cavity  Unclear etiology Not currently stable for CT with contrast due to resp status Will defer to pulm pulmonology   Leukocytosis Due

## 2023-09-16 NOTE — Progress Notes (Signed)
Called xray x 2 for results on KUB. Still waiting to hook NG tube to LWS. Will continue to monitor. Patient states he will do enema tonight after his company leaves.  Lidia Collum, RN

## 2023-09-16 NOTE — Progress Notes (Signed)
After 2 attempts NG tube placed

## 2023-09-16 NOTE — Progress Notes (Signed)
NAME:  Corey Griffin, MRN:  [id number], DOB:  [birth year], LOS: 6 ADMISSION DATE:  09-12-2023, CHIEF COMPLAINT:  Respiratory Failure   History of Present Illness:   Patient is a  77 yo M w/ pertinent PMH COPD on o2(seen by Dr. Sherene Sires), chronic pain, chronic constipation presents to [facility] ED on Sep 12, 2023 w/ abd distention and pain.   Patient states he has had abd distention, constipation, and pain for several days. Uanble to eat or drink due to the pain. Denies any vomiting or diarrhea. Has hx of severe abd wall thinning. Concerned he has a blockage and came to [facility] on 2023/09/12 for further workup. On arrival patient tachycardic 110s, afebrile, bp stable. Abd distended w/ pain on palpation. WBC 16. Cultures obtained and started on cefepime/flagyl. UA negative. Lipase wnl. CT abd/pelvis showing free air in stomach excision for perforated gastric ulcer. Surgery consulted. Taken to OR for ex lap with gastric ulcer repair, biopsy, graham patch placement. JP drain in place. Given hx of COPD plan to keep post op intubated overnight and [facility]. PCCM consulted for transfer.  Pertinent  Medical History  -COPD  Significant Hospital Events: Including procedures, antibiotic start and stop dates in addition to other pertinent events   09/12/2023 admit to [facility] for gastric ulcer w/ free air in abd; taking to OR for ex lap with graham patch; post op remains intubated transfer to [facility]; pccm consulted 10/14 Extubated 10/15 transfer out of ICU 10/17 some resp distress overnight, given lasix and offered BiPAP but he refused, placed on HHFNC 40L at 40%. CXR overnight showed interstitial opacities bilaterally with possible LUL cavitation and small R effusion  Interim History / Subjective:  Continues to be short of breath, on hi-flo nasal cannula.   Objective   Blood pressure 100/67, pulse (!) 102, temperature 99.3 F (37.4 C), temperature source Oral, resp. rate (!) 33, height 5\' 9"  (1.753 m), weight 66.1 kg, SpO2 93%.     FiO2 (%):  [40 %-45 %] 42 %   Intake/Output Summary (Last 24 hours) at 09/16/2023 1558 Last data filed at 09/16/2023 1508 Gross per 24 hour  Intake 2005.66 ml  Output 850 ml  Net 1155.66 ml   Filed Weights   09-12-23 1617 09/11/23 0600 09/12/23 0443  Weight: 68 kg 65.3 kg 66.1 kg    Examination:  General: in mild distress, resting in bed HEENT: [place]/AT, moist mucous membranes, sclera anicteric Neuro: A&O, moving all extremities CV: rrr, s1s2, no murmurs PULM: clear to auscultation anteriorly, rales over the bases GI: soft, tender, distended Extremities: warm, no edema  POCUS of the right lung 09/16/2023     Assessment & Plan:   #Acute on Chronic Hypoxic and Hypercapnic Respiratory Failure #Aspiration Pneumonia #Right Pleural Effusion  77 year old male with history of COPD followed by Dr. Sherene Sires presents to the hospital with abdominal pain, found to have a perforated gastric ulcer now s/p repair with graham patch. He developed acute on chronic hypoxic respiratory failure that is likely secondary to aspiration. CXR showing an infiltrate in the right lung base, and on my point of care ultrasound I note a consolidated RLL with an associated pleural effusion.   The differential for this includes a parapneumonic effusion secondary to aspiration / hospital acquired pneumonia vs a reactive effusion following his surgery. POCUS of the right lung base remains limited given patient's body habitus and severe pain. [place] yesterday showed a simple appearing effusion with collapsed RLL. Today, the RLL remains  collapsed, but the pleural effusion appears smaller in size, and remains simple appearing without septations or debris. I plan on continuing to monitor the effusion with serial ultrasound, with plan for procedural intervention should it grow in size or show signs of organization.  -recommend nocturnal BiPAP given chronic hypercapnia -serial US of the right pleural space -continue broad  spectrum antibiotics for HAP -wean HFNC as able -pulmonary toilet (incentive spirometry, flutter device, mechanical percussion) recommended -DNR/DNI  Raechel Chute, MD  Pulmonary Critical Care 09/16/2023 4:05 PM    Labs   CBC: Recent Labs  Lab 09/06/2023 1640 09/21/2023 2350 09/11/23 0011 09/11/23 0329 09/12/23 0949 09/14/23 0842 09/15/23 0601  WBC 16.1*  --  24.5* 26.8* 24.6* 26.0* 23.3*  NEUTROABS 14.0*  --   --   --   --  23.1* 20.3*  HGB 12.2*   < > 11.5* 11.7* 9.6* 9.4* 10.4*  HCT 39.3   < > 37.2* 37.1* 30.6* 30.8* 33.6*  MCV 100.3*  --  99.5 99.5 100.0 100.7* 102.8*  PLT 617*  --  585* 541* 499* 503* 510*   < > = values in this interval not displayed.    Basic Metabolic Panel: Recent Labs  Lab 09/12/23 0949 09/13/23 0514 09/14/23 0842 09/15/23 0601 09/16/23 0506  NA 141 141 141 143 143  K 3.8 3.5 3.8 3.5 4.2  CL 102 105 107 103 104  CO2 30 31 28  32 33*  GLUCOSE 111* 120* 115* 142* 139*  BUN 23 23 26* 26* 33*  CREATININE 0.74 0.57* 0.60* 0.56* 0.62  CALCIUM 8.5* 8.6* 8.7* 9.2 8.3*  MG 2.3 2.1 1.8 1.9 1.9  PHOS 2.7 2.3* 2.6 2.8 2.5   GFR: Estimated Creatinine Clearance: 71.1 mL/min (by C-G formula based on SCr of 0.62 mg/dL). Recent Labs  Lab 09/12/2023 1630 09/14/2023 1640 09/03/2023 1809 09/11/23 0011 09/11/23 0329 09/12/23 0949 09/14/23 0842 09/15/23 0601  PROCALCITON 0.21  --   --   --   --   --   --   --   WBC  --    < >  --  24.5* 26.8* 24.6* 26.0* 23.3*  LATICACIDVEN  --   --  1.2 1.8 1.7  --   --   --    < > = values in this interval not displayed.    Liver Function Tests: Recent Labs  Lab 08/31/2023 1640 09/11/23 0329 09/14/23 0842 09/15/23 0601 09/16/23 0506  AST 22 27 19 19 19   ALT 18 17 16 19 19   ALKPHOS 185* 117 111 135* 163*  BILITOT 0.9 0.7 0.1* 0.6 0.1*  PROT 8.0 5.9* 5.3* 5.7* 5.3*  ALBUMIN 2.8* 1.8* <1.5* <1.5* <1.5*   Recent Labs  Lab 09/11/2023 1640  LIPASE 20   No results for input(s): "AMMONIA" in the last 168  hours.  ABG    Component Value Date/Time   PHART 7.404 09/13/2023 2350   PCO2ART 52.8 (H) 09/11/2023 2350   PO2ART 58 (L) 09/01/2023 2350   HCO3 36.9 (H) 09/16/2023 1000   TCO2 35 (H) 09/28/2023 2350   O2SAT 76.9 09/16/2023 1000     Coagulation Profile: Recent Labs  Lab 09/11/23 0011  INR 1.2    Cardiac Enzymes: No results for input(s): "CKTOTAL", "CKMB", "CKMBINDEX", "TROPONINI" in the last 168 hours.  HbA1C: No results found for: "HGBA1C"  CBG: Recent Labs  Lab 09/13/23 2323 09/14/23 0603 09/14/23 1150 09/14/23 2037 09/15/23 0806  GLUCAP 125* 117* 121* 119* 120*     Past  Medical History:  He,  has a past medical history of COPD (chronic obstructive pulmonary disease) (HCC).   Surgical History:   Past Surgical History:  Procedure Laterality Date   COLONOSCOPY WITH PROPOFOL N/A 09/04/2018   Procedure: COLONOSCOPY WITH PROPOFOL;  Surgeon: Benancio Deeds, MD;  Location: [facility];  Service: Gastroenterology;  Laterality: N/A;   HAND SURGERY     LAPAROTOMY N/A 09/19/2023   Procedure: EXPLORATORY LAPAROTOMY,  graham patch, drain placement;  Surgeon: Lucretia Roers, MD;  Location: [facility];  Service: General;  Laterality: N/A;   POLYPECTOMY  09/04/2018   Procedure: POLYPECTOMY;  Surgeon: Benancio Deeds, MD;  Location: [facility];  Service: Gastroenterology;;     Social History:   reports that he has been smoking cigarettes. He has a 50 pack-year smoking history. He has never used smokeless tobacco. He reports current alcohol use. He reports that he does not use drugs.   Family History:  His family history is not on file.   Allergies No Known Allergies   Home Medications  Prior to Admission medications   Medication Sig Start Date End Date Taking? Authorizing Provider  albuterol (PROVENTIL HFA;VENTOLIN HFA) 108 (90 Base) MCG/ACT inhaler Inhale 1-2 puffs into the lungs every 6 (six) hours as needed for wheezing or shortness of  breath. Patient taking differently: Inhale 2 puffs into the lungs every 6 (six) hours as needed for wheezing or shortness of breath. 01/01/17  Yes Blane Ohara, MD  albuterol (PROVENTIL) (2.5 MG/3ML) 0.083% nebulizer solution Take 3 mLs (2.5 mg total) by nebulization every 4 (four) hours as needed for wheezing or shortness of breath. 01/01/17  Yes Blane Ohara, MD  budesonide-formoterol [facility]) 160-4.5 MCG/ACT inhaler Inhale 2 puffs into the lungs 2 (two) times daily. 05/23/22  Yes Nyoka Cowden, MD  furosemide (LASIX) 40 MG tablet Take 1 tablet (40 mg total) by mouth daily for 90 doses. Patient taking differently: Take 40 mg by mouth 2 (two) times daily. 08/16/22 09/11/23 Yes Nyoka Cowden, MD  ipratropium (ATROVENT HFA) 17 MCG/ACT inhaler Inhale 2 puffs into the lungs every 6 (six) hours. Patient taking differently: Inhale 2 puffs into the lungs as needed for wheezing. 01/01/17  Yes Blane Ohara, MD  LINZESS 145 MCG CAPS capsule Take 145 mcg by mouth daily as needed.   Yes [provider]  mometasone (NASONEX) 50 MCG/ACT nasal spray Place 1 spray into the nose 2 (two) times daily.    Yes [provider]  Oxycodone HCl 10 MG TABS Take 10 mg by mouth every 6 (six) hours as needed (pain).   Yes [provider]  oxymetazoline (AFRIN) 0.05 % nasal spray Place 2 sprays into both nostrils as needed for congestion.   Yes [provider]  potassium chloride (KLOR-CON) 10 MEQ tablet Take 1 tablet by mouth daily. 02/09/23  Yes [provider]  SPIRIVA HANDIHALER 18 MCG inhalation capsule Place 1 capsule into inhaler and inhale daily as needed (wheezing/SOB).   Yes [provider]  tamsulosin (FLOMAX) 0.4 MG CAPS capsule Take 0.4 mg by mouth daily.   Yes [provider]     I spent 36 minutes caring for this patient today, including preparing to see the patient, obtaining a medical history , reviewing a separately obtained history, performing  a medically appropriate examination and/or evaluation, counseling and educating the patient/family/caregiver, ordering medications, tests, or procedures, documenting clinical information in the electronic health record, and independently interpreting results (not separately reported/billed) and communicating  results to the patient/family/caregiver   Raechel Chute, MD [place] [facility] 09/16/2023 4:05 PM

## 2023-09-16 NOTE — Progress Notes (Signed)
Progress Note  6 Days Post-Op  Subjective: Patient laying in bed with HFNC on. Drain serous, abdomen distended still. No bowel function recorded for overnight. Daughter at bedside.   Objective: Vital signs in last 24 hours: Temp:  [98.2 F (36.8 C)-99.3 F (37.4 C)] 99 F (37.2 C) (10/19 0737) Pulse Rate:  [100-110] 102 (10/19 0514) Resp:  [18-37] 33 (10/19 0514) BP: (94-106)/(55-62) 105/62 (10/19 0737) SpO2:  [89 %-94 %] 94 % (10/19 0550) FiO2 (%):  [40 %-45 %] 40 % (10/19 0946) Last BM Date :  (Pt. cannot recall date)  Intake/Output from previous day: 10/18 0701 - 10/19 0700 In: 2005.7 [I.V.:1285.3; IV Piggyback:720.3] Out: 2060 [Urine:2000; Drains:60] Intake/Output this shift: No intake/output data recorded.  PE: General: chronically ill appearing elderly  male who is laying in bed  Heart: sinus tachy in the 100s Lungs: HFNC Abd: soft, appropriately ttp, incision C/D/I with staples present, JP with serous output, distended    Lab Results:  Recent Labs    09/14/23 0842 09/15/23 0601  WBC 26.0* 23.3*  HGB 9.4* 10.4*  HCT 30.8* 33.6*  PLT 503* 510*   BMET Recent Labs    09/15/23 0601 09/16/23 0506  NA 143 143  K 3.5 4.2  CL 103 104  CO2 32 33*  GLUCOSE 142* 139*  BUN 26* 33*  CREATININE 0.56* 0.62  CALCIUM 9.2 8.3*   PT/INR No results for input(s): "LABPROT", "INR" in the last 72 hours.  CMP     Component Value Date/Time   NA 143 09/16/2023 0506   NA 149 (H) 02/07/2022 1604   K 4.2 09/16/2023 0506   CL 104 09/16/2023 0506   CO2 33 (H) 09/16/2023 0506   GLUCOSE 139 (H) 09/16/2023 0506   BUN 33 (H) 09/16/2023 0506   BUN 19 02/07/2022 1604   CREATININE 0.62 09/16/2023 0506   CALCIUM 8.3 (L) 09/16/2023 0506   PROT 5.3 (L) 09/16/2023 0506   ALBUMIN <1.5 (L) 09/16/2023 0506   AST 19 09/16/2023 0506   ALT 19 09/16/2023 0506   ALKPHOS 163 (H) 09/16/2023 0506   BILITOT 0.1 (L) 09/16/2023 0506   GFRNONAA >60 09/16/2023 0506   GFRAA >60  01/01/2017 0919   Lipase     Component Value Date/Time   LIPASE 20 09/09/2023 1640       Studies/Results: ECHOCARDIOGRAM COMPLETE  Result Date: 09/15/2023    ECHOCARDIOGRAM REPORT   Patient Name:   Corey Griffin Date of Exam: 09/15/2023 Medical Rec #:  [id number]     Height:       69.0 in Accession #:    [id number]    Weight:       145.7 lb Date of Birth:  11-18-1946      BSA:          1.806 m Patient Age:    77 years      BP:           105/63 mmHg Patient Gender: M             HR:           115 bpm. Exam Location:  Inpatient Procedure: 2D Echo, Cardiac Doppler and Color Doppler Indications:    Pulmonary Edema  History:        Patient has no prior history of Echocardiogram examinations.                 COPD.  Sonographer:    Darlys Gales Referring Phys: [id number]  Progress Note  6 Days Post-Op  Subjective: Patient laying in bed with HFNC on. Drain serous, abdomen distended still. No bowel function recorded for overnight. Daughter at bedside.   Objective: Vital signs in last 24 hours: Temp:  [98.2 F (36.8 C)-99.3 F (37.4 C)] 99 F (37.2 C) (10/19 0737) Pulse Rate:  [100-110] 102 (10/19 0514) Resp:  [18-37] 33 (10/19 0514) BP: (94-106)/(55-62) 105/62 (10/19 0737) SpO2:  [89 %-94 %] 94 % (10/19 0550) FiO2 (%):  [40 %-45 %] 40 % (10/19 0946) Last BM Date :  (Pt. cannot recall date)  Intake/Output from previous day: 10/18 0701 - 10/19 0700 In: 2005.7 [I.V.:1285.3; IV Piggyback:720.3] Out: 2060 [Urine:2000; Drains:60] Intake/Output this shift: No intake/output data recorded.  PE: General: chronically ill appearing elderly  male who is laying in bed  Heart: sinus tachy in the 100s Lungs: HFNC Abd: soft, appropriately ttp, incision C/D/I with staples present, JP with serous output, distended    Lab Results:  Recent Labs    09/14/23 0842 09/15/23 0601  WBC 26.0* 23.3*  HGB 9.4* 10.4*  HCT 30.8* 33.6*  PLT 503* 510*   BMET Recent Labs    09/15/23 0601 09/16/23 0506  NA 143 143  K 3.5 4.2  CL 103 104  CO2 32 33*  GLUCOSE 142* 139*  BUN 26* 33*  CREATININE 0.56* 0.62  CALCIUM 9.2 8.3*   PT/INR No results for input(s): "LABPROT", "INR" in the last 72 hours.  CMP     Component Value Date/Time   NA 143 09/16/2023 0506   NA 149 (H) 02/07/2022 1604   K 4.2 09/16/2023 0506   CL 104 09/16/2023 0506   CO2 33 (H) 09/16/2023 0506   GLUCOSE 139 (H) 09/16/2023 0506   BUN 33 (H) 09/16/2023 0506   BUN 19 02/07/2022 1604   CREATININE 0.62 09/16/2023 0506   CALCIUM 8.3 (L) 09/16/2023 0506   PROT 5.3 (L) 09/16/2023 0506   ALBUMIN <1.5 (L) 09/16/2023 0506   AST 19 09/16/2023 0506   ALT 19 09/16/2023 0506   ALKPHOS 163 (H) 09/16/2023 0506   BILITOT 0.1 (L) 09/16/2023 0506   GFRNONAA >60 09/16/2023 0506   GFRAA >60  01/01/2017 0919   Lipase     Component Value Date/Time   LIPASE 20 09/09/2023 1640       Studies/Results: ECHOCARDIOGRAM COMPLETE  Result Date: 09/15/2023    ECHOCARDIOGRAM REPORT   Patient Name:   Corey Griffin Date of Exam: 09/15/2023 Medical Rec #:  [id number]     Height:       69.0 in Accession #:    [id number]    Weight:       145.7 lb Date of Birth:  11-18-1946      BSA:          1.806 m Patient Age:    77 years      BP:           105/63 mmHg Patient Gender: M             HR:           115 bpm. Exam Location:  Inpatient Procedure: 2D Echo, Cardiac Doppler and Color Doppler Indications:    Pulmonary Edema  History:        Patient has no prior history of Echocardiogram examinations.                 COPD.  Sonographer:    Darlys Gales Referring Phys: [id number]  g 200 mL/hr over 30 Minutes Intravenous Every 6 hours 09/11/23 1148 09/15/23 1009   09/11/23 0600  cefoTEtan (CEFOTAN) 2 g in sodium chloride 0.9 % 100 mL IVPB        2 g 200 mL/hr over 30 Minutes Intravenous On call to O.R. 09/13/2023 2007 2023-09-13 2127   09/11/23 0000  azithromycin (ZITHROMAX) 500 mg in sodium chloride 0.9 % 250 mL IVPB  Status:  Discontinued        500 mg 250 mL/hr over 60 Minutes Intravenous Every 24 hours 09-13-2023 2330 09/11/23 1148   09/13/23 2330  fluconazole (DIFLUCAN) IVPB 800 mg       Note to Pharmacy: Attention pharmacist: For 800 mg doses, change infusion duration to 240 min in verifcation via edit clinical information.  Placed in "Followed by" Linked Group   800 mg 100  mL/hr over 240 Minutes Intravenous  Once 2023/09/13 2318 09/11/23 0409   09-13-2023 2019  sodium chloride 0.9 % with cefoTEtan (CEFOTAN) ADS Med       Note to Pharmacy: Sherlene Shams: cabinet override      09/13/23 2019 2023/09/13 2113   September 13, 2023 1845  ceFEPIme (MAXIPIME) 2 g in sodium chloride 0.9 % 100 mL IVPB  Status:  Discontinued        2 g 200 mL/hr over 30 Minutes Intravenous Every 8 hours 2023-09-13 1840 09/11/23 1148   Sep 13, 2023 1830  piperacillin-tazobactam (ZOSYN) IVPB 3.375 g  Status:  Discontinued        3.375 g 100 mL/hr over 30 Minutes Intravenous  Once Sep 13, 2023 1815 09/13/2023 1818   09/13/23 1830  ceFEPIme (MAXIPIME) 2 g in sodium chloride 0.9 % 100 mL IVPB  Status:  Discontinued        2 g 200 mL/hr over 30 Minutes Intravenous  Once 13-Sep-2023 1818 09-13-23 1840   Sep 13, 2023 1830  metroNIDAZOLE (FLAGYL) IVPB 500 mg        500 mg 100 mL/hr over 60 Minutes Intravenous  Once 09-13-2023 1818 Sep 13, 2023 1955        Assessment/Plan Perforated gastric ulcer  POD6, s/p ex-lap with graham patch 09/13/23 Dr. Henreitta Leber - NGT removed 10/17, no further emesis but abdomen distended - chest KUB - may need NGT replaced if stomach significantly distended to mitigate aspiration risk  - path c/w ulceration without malignancy or H.pylori on specimen - mobilize as able. -WBC 23 yesterday, no CBC today despite being ordered.  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    Juliet Rude, [facility] 09/16/2023, 11:10 AM Please see Amion for pager number during day hours  7:00am-4:30pm  g 200 mL/hr over 30 Minutes Intravenous Every 6 hours 09/11/23 1148 09/15/23 1009   09/11/23 0600  cefoTEtan (CEFOTAN) 2 g in sodium chloride 0.9 % 100 mL IVPB        2 g 200 mL/hr over 30 Minutes Intravenous On call to O.R. 09/13/2023 2007 2023-09-13 2127   09/11/23 0000  azithromycin (ZITHROMAX) 500 mg in sodium chloride 0.9 % 250 mL IVPB  Status:  Discontinued        500 mg 250 mL/hr over 60 Minutes Intravenous Every 24 hours 09-13-2023 2330 09/11/23 1148   09/13/23 2330  fluconazole (DIFLUCAN) IVPB 800 mg       Note to Pharmacy: Attention pharmacist: For 800 mg doses, change infusion duration to 240 min in verifcation via edit clinical information.  Placed in "Followed by" Linked Group   800 mg 100  mL/hr over 240 Minutes Intravenous  Once 2023/09/13 2318 09/11/23 0409   09-13-2023 2019  sodium chloride 0.9 % with cefoTEtan (CEFOTAN) ADS Med       Note to Pharmacy: Sherlene Shams: cabinet override      09/13/23 2019 2023/09/13 2113   September 13, 2023 1845  ceFEPIme (MAXIPIME) 2 g in sodium chloride 0.9 % 100 mL IVPB  Status:  Discontinued        2 g 200 mL/hr over 30 Minutes Intravenous Every 8 hours 2023-09-13 1840 09/11/23 1148   Sep 13, 2023 1830  piperacillin-tazobactam (ZOSYN) IVPB 3.375 g  Status:  Discontinued        3.375 g 100 mL/hr over 30 Minutes Intravenous  Once Sep 13, 2023 1815 09/13/2023 1818   09/13/23 1830  ceFEPIme (MAXIPIME) 2 g in sodium chloride 0.9 % 100 mL IVPB  Status:  Discontinued        2 g 200 mL/hr over 30 Minutes Intravenous  Once 13-Sep-2023 1818 09-13-23 1840   Sep 13, 2023 1830  metroNIDAZOLE (FLAGYL) IVPB 500 mg        500 mg 100 mL/hr over 60 Minutes Intravenous  Once 09-13-2023 1818 Sep 13, 2023 1955        Assessment/Plan Perforated gastric ulcer  POD6, s/p ex-lap with graham patch 09/13/23 Dr. Henreitta Leber - NGT removed 10/17, no further emesis but abdomen distended - chest KUB - may need NGT replaced if stomach significantly distended to mitigate aspiration risk  - path c/w ulceration without malignancy or H.pylori on specimen - mobilize as able. -WBC 23 yesterday, no CBC today despite being ordered.  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    Juliet Rude, [facility] 09/16/2023, 11:10 AM Please see Amion for pager number during day hours  7:00am-4:30pm

## 2023-09-16 NOTE — Plan of Care (Signed)
  Problem: Health Behavior/Discharge Planning: Goal: Ability to manage health-related needs will improve Outcome: Progressing   Problem: Clinical Measurements: Goal: Will remain free from infection Outcome: Progressing Goal: Cardiovascular complication will be avoided Outcome: Progressing   Problem: Elimination: Goal: Will not experience complications related to urinary retention Outcome: Progressing   Problem: Safety: Goal: Ability to remain free from injury will improve Outcome: Progressing

## 2023-09-16 NOTE — Progress Notes (Signed)
Patient refusing enema at this time. Will continue to encourage and notify physician.  Lidia Collum, RN

## 2023-09-16 NOTE — Progress Notes (Addendum)
PHARMACY - TOTAL PARENTERAL NUTRITION CONSULT NOTE   Indication:  Gastric Ulcer Perforation  Patient Measurements: Height: 5\' 9"  (175.3 cm) Weight: 66.1 kg (145 lb 11.6 oz) IBW/kg (Calculated) : 70.7 TPN AdjBW (KG): 68 Body mass index is 21.52 kg/m. Usual Weight: 180 lbs.  Assessment:  30 YOM admitted for perforation of gastric ulcer after presenting 2023-10-04) with several days of abdominal pain and distention which prevented him from eating or drinking except for small sips of water. Underwent exploratory laparotomy, primary repair of gastric ulcer (1.5 cm), biopsy, and graham patch at [facility] 10-04-23). PMHx significant for chronic constipation and thinning of abdominal wall with abdominal distention, COPD on home O2. Unable to speak with patient due to intubation.  Presumed moderate-to-high refeeding risk given temporal wasting per MD. Plan for NPO for at least 5 days post-op per team. Pharmacy consulted for TPN.  Unable to make customized TPN and utilizing Clinimix 8/10 due to IVF shortage caused by [facility] per hospital policy. Will not be able to meet full nutrition goals due to limited stock and fluid restrictions. Will replace electrolytes and give lipids outside of Clinimix.   10/16: Lytes trending down steadily, potentially refeeding, will continue to monitor.  Glucose / Insulin: No Hx of DM, A1c unknown, BG < 180 - no insulin Electrolytes: Phos 2.5 (down), CoCa 10.38, Mag 1.9, K 4.2, Na 143, Ch 104 Renal: Scr 0.62, BUN 33 (up) Hepatic: Albumin < 1.5, Tbili 0.1, AlkPhos 163, /AST/ALT wnl, TG 63 Intake / Output; MIVF: UOP 1.26 mL/kg/hr, NGT nothing charted, JP drain 60 mL  GI Imaging: Oct 04, 2023 CT Abdomen/Pelvis: free air concerning for gastric or duodenal ulcer, likely acute cholecystitis  10/04/2023 KUB: nonobstructive bowel gas 10/17 UGI: no contrast leak, no identifiable obstruction or small bowel abnormality  GI Surgeries/Procedures:  10-04-2023: Ex-lap, graham patch, JP drain  and NGT placement 10/14: Extubated, propofol off  Central access: PICC 10/14 TPN start date: 10/14  Nutritional Goals: Clinimix E 8/10 1000 mL/day MW @ 42 mL/hr (provides 80 g protein and 664 kcal per day)  Clinimix E 8/10 2000 mL/day TTFriSatSun @ 82 mL/hr (provides 160 g protein and 1325 kcal per day) To provide an average of 137 g AA and 1586 Kcal per day.   RD Assessment:  Estimated Needs Total Energy Estimated Needs: 1900-2100 kcals Total Protein Estimated Needs: 110-125 g Total Fluid Estimated Needs: >/= 1.8 L  Current Nutrition:  TPN NPO  Plan:  Continue Clinimix E 8/10 at 1800: Will alternate: - per day (42 ml/hr) on M/Th - per day (44ml/hr) on Tu/Wed/Fri/Sat/Sun - Smof lipid to 250 ml daily To provide an average of 137 g AA and 1586 Kcal per day, meeting 100% of protein and ~83% of Kcal goals; lipids provide 28% of kcals. Unable to meet full nutrition requirements due to Clinimix and volume limitations.   Electrolytes:  - 1000 mL per day: Na 35 mEq, K 30 mEq, Mag 5 mEq, Phos 15 mmol, Acetate 83 mEq, Cl 76 mEq in Clinimix - 2000 mL per day:no E-lytes within TPN 10/19 Additional: - Mag 2 g IV - Naphos 15 mmol x1 Add to clinimix standard MVI and trace elements, no chromium due to shortage Additional IVF per team Monitor TPN labs daily while on Clinimix F/u clear liquid diet trial given results of UGI   Annabel Gibeau BS, PharmD, BCPS Clinical Pharmacist 09/16/2023 6:59 AM  Contact: [phone] after 3 PM  "Be curious, not judgmental..." -Debbora Dus

## 2023-09-16 NOTE — Consult Note (Signed)
Palliative Care Consult Note                                  Date: 09/16/2023   Patient Name: Corey Griffin  DOB: 1946-03-20  MRN: [id number]  Age / Sex: 77 y.o., male  PCP: Benita Stabile, MD Referring Physician: Zigmund Daniel., *  Reason for Consultation: Establishing goals of care  HPI/Patient Profile: 77 y.o. male  with past medical history of COPD on O2, chronic pain, and chronic constipation admitted on 09/05/2023 with abdominal distention and pain.   09/24/2023: CT abd/pelvis showed free air in stomach excision for perforated gastric ulcer. Surgery consulted. Taken to OR for ex lap with gastric ulcer repair, biopsy, graham patch placement. 10/17 PO trial of clears after UGI without evidence of contrast leak 10/17-18 overnight declined with increasing SOB and increasing O2 requirement -> lasix ordered 10/18 am greenish vomit, made NPO again 10/19 NG tube placed    Past Medical History:  Diagnosis Date   COPD (chronic obstructive pulmonary disease) (HCC)     Subjective:   I have reviewed medical records including EPIC notes, labs and imaging,  assessed the patient and then at his request met with his daughter Meribeth Mattes to discuss diagnosis prognosis, GOC, EOL wishes, disposition and options.  I introduced Palliative Medicine as specialized medical care for people living with serious illness. It focuses on providing relief from symptoms and stress of a serious illness. The goal is to improve quality of life for both the patient and the family.  Today's Discussion: The patient asked me to meet with his daughter while he slept. She shared her understanding of his chronic conditions including his COPD and his current hospitalization. I reviewed my understanding, including limitations of ongoing interventions and risk for further decline despite treatment efforts. We discussed his fragile respiratory status, the perforation and surgery  to repair, his sacral pressure ulcer, and his decline both over time and since the hospitalization. We discussed code status and scope of care. Verified patient is do not attempt resuscitation and limited scope of treatment with no intubation.   She shares that the patient has always been very independent. She shares that his living conditions have not been ideal, but they are what the patient will allow. The patient lives in his own home and his grandson lives with him. The patient has lived in a lift chair the last few years. He has set up the area around the lift chair with a mini fridge, hot plate, and his computer. He is able to prepare many of his own meals with his grandson helping with shopping. He sponge bathes from his lift chair. The patient often refuses help and does not let others know when health issues arise for example he did not share that he had a sacral wound.  We discussed that in the best case scenario where the patient improves, it is still unlikely he could return home. We also discussed options if he continues to decline including comfort care. Patient's daughter understands the seriousness of the patient's condition. Patient's daughter would like time for outcomes and to allow the antibiotics to work.   Discussed the importance of continued conversation with family and the medical providers regarding overall plan of care and treatment options, ensuring decisions are within the context of the patient's values and GOCs.  Questions and concerns were addressed. Hard Choices booklet left for  review. The family was encouraged to call with questions or concerns. PMT will continue to support holistically.  Review of Systems  Unable to perform ROS   Objective:   Primary Diagnoses: Present on Admission:  Bowel perforation (HCC)  Gastric ulcer with perforation [facility])   Physical Exam Vitals reviewed.  Constitutional:      General: He is sleeping.     Appearance: He is  ill-appearing.     Comments: HFNC  HENT:     Head:     Comments: NG tube Cardiovascular:     Rate and Rhythm: Tachycardia present.  Pulmonary:     Effort: Tachypnea present.  Skin:    General: Skin is warm and dry.  Neurological:     Mental Status: He is easily aroused.  Psychiatric:        Mood and Affect: Mood normal.     Vital Signs:  BP 106/68 (BP Location: Left Arm)   Pulse (!) 102   Temp 99.3 F (37.4 C) (Oral)   Resp (!) 33   Ht 5\' 9"  (1.753 m)   Wt 66.1 kg   SpO2 93%   BMI 21.52 kg/m   Palliative Assessment/Data: 30% with TPN    Advanced Care Planning:   Existing Vynca/ACP Documentation: None  Primary Decision Maker: Patient. If patient were unable to make decisions his next of kin is his daughter Rogers Blocker.  Code Status/Advance Care Planning: DNR   Assessment & Plan:   SUMMARY OF RECOMMENDATIONS   DNR Limited scope of care-- Do not intubate Time for outcomes- treat the treatable Encouraged continued discussion re: goals of care PMT will continue to support    Discussed with: Dr. Lowell Guitar  Time Total: 75 minutes  Thank you for allowing [place] to participate in the care of Leane Para PMT will continue to support holistically.     Signed by: Sarina Ser, NP Palliative Medicine Team  Team Phone # [phone] (Nights/Weekends)  09/16/2023, 5:36 PM

## 2023-09-17 DIAGNOSIS — Z515 Encounter for palliative care: Secondary | ICD-10-CM | POA: Diagnosis not present

## 2023-09-17 DIAGNOSIS — Z7189 Other specified counseling: Secondary | ICD-10-CM | POA: Diagnosis not present

## 2023-09-17 DIAGNOSIS — K251 Acute gastric ulcer with perforation: Secondary | ICD-10-CM | POA: Diagnosis not present

## 2023-09-17 LAB — BASIC METABOLIC PANEL
Anion gap: 4 — ABNORMAL LOW (ref 5–15)
BUN: 33 mg/dL — ABNORMAL HIGH (ref 8–23)
CO2: 31 mmol/L (ref 22–32)
Calcium: 8.6 mg/dL — ABNORMAL LOW (ref 8.9–10.3)
Chloride: 108 mmol/L (ref 98–111)
Creatinine, Ser: 0.55 mg/dL — ABNORMAL LOW (ref 0.61–1.24)
GFR, Estimated: >60 mL/min (ref >60)
Glucose, Bld: 148 mg/dL — ABNORMAL HIGH (ref 70–99)
Potassium: 3.8 mmol/L (ref 3.5–5.1)
Sodium: 143 mmol/L (ref 135–145)

## 2023-09-17 LAB — GLUCOSE, CAPILLARY
Glucose-Capillary: 143 mg/dL — ABNORMAL HIGH (ref 70–99)
Glucose-Capillary: 135 mg/dL — ABNORMAL HIGH (ref 70–99)
Glucose-Capillary: 144 mg/dL — ABNORMAL HIGH (ref 70–99)
Glucose-Capillary: 141 mg/dL — ABNORMAL HIGH (ref 70–99)
Glucose-Capillary: 133 mg/dL — ABNORMAL HIGH (ref 70–99)
Glucose-Capillary: 144 mg/dL — ABNORMAL HIGH (ref 70–99)

## 2023-09-17 LAB — CBC
HCT: 32.1 % — ABNORMAL LOW (ref 39.0–52.0)
Hemoglobin: 9.5 g/dL — ABNORMAL LOW (ref 13.0–17.0)
MCH: 30.9 pg (ref 26.0–34.0)
MCHC: 29.6 g/dL — ABNORMAL LOW (ref 30.0–36.0)
MCV: 104.6 fL — ABNORMAL HIGH (ref 80.0–100.0)
Platelets: 415 10*3/uL — ABNORMAL HIGH (ref 150–400)
RBC: 3.07 MIL/uL — ABNORMAL LOW (ref 4.22–5.81)
RDW: 16.6 % — ABNORMAL HIGH (ref 11.5–15.5)
WBC: 26.8 10*3/uL — ABNORMAL HIGH (ref 4.0–10.5)
nRBC: 0 % (ref 0.0–0.2)

## 2023-09-17 LAB — PHOSPHORUS: Phosphorus: 1.5 mg/dL — ABNORMAL LOW (ref 2.5–4.6)

## 2023-09-17 LAB — MAGNESIUM: Magnesium: 2 mg/dL (ref 1.7–2.4)

## 2023-09-17 MED ORDER — POTASSIUM PHOSPHATES 15 MMOLE/5ML IV SOLN
15.0000 mmol | Freq: Once | INTRAVENOUS | Status: AC
  Administered 2023-09-17: 15 mmol via INTRAVENOUS
  Filled 2023-09-17 (×2): qty 5

## 2023-09-17 MED ORDER — POTASSIUM CHLORIDE 10 MEQ/100ML IV SOLN
INTRAVENOUS | Status: AC
  Filled 2023-09-17: qty 100

## 2023-09-17 MED ORDER — FAT EMUL FISH OIL/PLANT BASED 20% (SMOFLIPID)IV EMUL
250.0000 mL | INTRAVENOUS | Status: DC
  Administered 2023-09-17: 250 mL via INTRAVENOUS
  Filled 2023-09-17: qty 250

## 2023-09-17 MED ORDER — TRACE MINERALS CU-MN-SE-ZN 300-55-60-3000 MCG/ML IV SOLN
INTRAVENOUS | Status: DC
  Filled 2023-09-17: qty 2000

## 2023-09-17 NOTE — Progress Notes (Signed)
Patient and daughter refused enema. He had a medium bowel movement just around I was about to give him enema.

## 2023-09-17 NOTE — Progress Notes (Signed)
Progress Note  7 Days Post-Op  Subjective: NGT replaced d/t bowel distention.  Endorsing pain related to the tube but feels relief from less bloating.  + BM.    Objective: Vital signs in last 24 hours: Temp:  [98.8 F (37.1 C)-99.5 F (37.5 C)] 98.9 F (37.2 C) (10/20 0746) Pulse Rate:  [91-115] 91 (10/20 0746) Resp:  [16-37] 28 (10/20 0746) BP: (97-113)/(60-73) 113/68 (10/20 0746) SpO2:  [92 %-97 %] 97 % (10/20 0746) FiO2 (%):  [40 %-62 %] 62 % (10/20 0746) Last BM Date :  (Pt. cannot recall date)  Intake/Output from previous day: 10/19 0701 - 10/20 0700 In: 1640.7 [I.V.:1164; IV Piggyback:476.7] Out: 1460 [Urine:1000; Emesis/NG output:400; Drains:60] Intake/Output this shift: No intake/output data recorded.  PE: General: chronically ill appearing elderly  male who is laying in bed  Heart: sinus tachy in the 100s Lungs: HFNC Abd: soft, appropriately ttp, incision C/D/I with staples present, JP with serous output, distended    Lab Results:  Recent Labs    09/15/23 0601  WBC 23.3*  HGB 10.4*  HCT 33.6*  PLT 510*   BMET Recent Labs    09/15/23 0601 09/16/23 0506  NA 143 143  K 3.5 4.2  CL 103 104  CO2 32 33*  GLUCOSE 142* 139*  BUN 26* 33*  CREATININE 0.56* 0.62  CALCIUM 9.2 8.3*   PT/INR No results for input(s): "LABPROT", "INR" in the last 72 hours.  CMP     Component Value Date/Time   NA 143 09/16/2023 0506   NA 149 (H) 02/07/2022 1604   K 4.2 09/16/2023 0506   CL 104 09/16/2023 0506   CO2 33 (H) 09/16/2023 0506   GLUCOSE 139 (H) 09/16/2023 0506   BUN 33 (H) 09/16/2023 0506   BUN 19 02/07/2022 1604   CREATININE 0.62 09/16/2023 0506   CALCIUM 8.3 (L) 09/16/2023 0506   PROT 5.3 (L) 09/16/2023 0506   ALBUMIN <1.5 (L) 09/16/2023 0506   AST 19 09/16/2023 0506   ALT 19 09/16/2023 0506   ALKPHOS 163 (H) 09/16/2023 0506   BILITOT 0.1 (L) 09/16/2023 0506   GFRNONAA >60 09/16/2023 0506   GFRAA >60 01/01/2017 0919   Lipase     Component  Value Date/Time   LIPASE 20 08/30/2023 1640       Studies/Results: DG Abd 1 View  Result Date: 09/16/2023 CLINICAL DATA:  Abdominal distension.  NG tube placement. EXAM: ABDOMEN - 1 VIEW COMPARISON:  Earlier today FINDINGS: Two portable supine views of the abdomen obtained. Repositioning of the enteric tube, second image demonstrates the tip and side-port below the diaphragm in the stomach. There is contrast in the colon from prior upper GI. Drain in the upper abdomen. Air within the central upper abdomen may represent gaseous gastric distension, however free air could have a similar appearance. The amount of air is greater than would be expected 6 days post laparotomy. Skin staples in place. IMPRESSION: 1. Repositioning of the enteric tube, tip and side-port are below the diaphragm in the stomach. 2. Air within the central upper abdomen may represent gaseous gastric distension, however free air could have a similar appearance. CT would be the study of choice for further assessment, however CT would be of likely limited value this time due to the significant dense contrast in the colon which would cause severe streak artifact and limit assessment. Continued clinical follow-up recommended. Electronically Signed   By: Narda Rutherford M.D.   On: 09/16/2023 23:06   DG  Abd 1 View  Result Date: 09/16/2023 CLINICAL DATA:  NG tube placement due to postoperative complication from gastric ulcer perforation. EXAM: ABDOMEN - 1 VIEW COMPARISON:  09/16/2023 FINDINGS: Limited field of view for tube placement purposes. An enteric tube has been placed. The tip is coiled back on itself and projected over the distal esophagus. Advancement is recommended if placement in the stomach is desired. Skin clips and surgical drains in the upper abdomen consistent with recent surgery. Infiltration or atelectasis in the right lung base. IMPRESSION: Enteric tube tip is coiled back on itself and located over the distal esophagus.  Advancement is recommended if placement in the stomach is desired. Electronically Signed   By: Burman Nieves M.D.   On: 09/16/2023 19:02   DG Abd Portable 1V  Result Date: 09/16/2023 CLINICAL DATA:  Abdominal distension. Recent gastric ulcer perforation repair. EXAM: PORTABLE ABDOMEN - 1 VIEW COMPARISON:  Upper GI 09/14/2023 FINDINGS: Suspected gaseous gastric distention in the upper abdomen. Surgical drain in the midline upper abdomen. There is contrast in the colon from prior upper GI. Persistent gaseous small bowel distension centrally, partially obscured by contrast. Technically limited due to habitus and portable technique. IMPRESSION: 1. Suspected gaseous gastric distention in the upper abdomen. 2. Enteric contrast in the colon from recent upper GI. 3. Persistent gaseous small bowel distension centrally, partially obscured by contrast. Favor postoperative ileus. Electronically Signed   By: Narda Rutherford M.D.   On: 09/16/2023 15:15   ECHOCARDIOGRAM COMPLETE  Result Date: 09/15/2023    ECHOCARDIOGRAM REPORT   Patient Name:   Corey Griffin Date of Exam: 09/15/2023 Medical Rec #:  [id number]     Height:       69.0 in Accession #:    [id number]    Weight:       145.7 lb Date of Birth:  1946/10/31      BSA:          1.806 m Patient Age:    77 years      BP:           105/63 mmHg Patient Gender: M             HR:           115 bpm. Exam Location:  Inpatient Procedure: 2D Echo, Cardiac Doppler and Color Doppler Indications:    Pulmonary Edema  History:        Patient has no prior history of Echocardiogram examinations.                 COPD.  Sonographer:    Darlys Gales Referring Phys: [phone] A CALDWELL POWELL JR  Sonographer Comments: Technically difficult study due to poor echo windows. TDS due to patient being rolled on his right side. IMPRESSIONS  1. Tehcnically difficult study with very limited views. Left ventricular ejection fraction, by estimation, is 55 to 60%. The left ventricle has grossly  normal function. Left ventricular endocardial border not optimally defined to evaluate regional wall motion. Left ventricular diastolic parameters are indeterminate.  2. Right ventricular systolic function was not well visualized. The right ventricular size is not well visualized. Tricuspid regurgitation signal is inadequate for assessing PA pressure.  3. The mitral valve is grossly normal. Trivial mitral valve regurgitation. No evidence of mitral stenosis.  4. The aortic valve was not well visualized. Aortic valve regurgitation is not visualized. No aortic stenosis is present. FINDINGS  Left Ventricle: Left ventricular ejection fraction, by estimation, is 55 to 60%. The  left ventricle has normal function. Left ventricular endocardial border not optimally defined to evaluate regional wall motion. The left ventricular internal cavity size was normal in size. There is no left ventricular hypertrophy. Left ventricular diastolic parameters are indeterminate. Right Ventricle: The right ventricular size is not well visualized. Right vetricular wall thickness was not well visualized. Right ventricular systolic function was not well visualized. Tricuspid regurgitation signal is inadequate for assessing PA pressure. Left Atrium: Left atrial size was not well visualized. Right Atrium: Right atrial size was not well visualized. Pericardium: There is no evidence of pericardial effusion. Mitral Valve: The mitral valve is grossly normal. Trivial mitral valve regurgitation. No evidence of mitral valve stenosis. Tricuspid Valve: The tricuspid valve is grossly normal. Tricuspid valve regurgitation is trivial. Aortic Valve: The aortic valve was not well visualized. Aortic valve regurgitation is not visualized. No aortic stenosis is present. Aortic valve mean gradient measures 6.0 mmHg. Aortic valve peak gradient measures 12.0 mmHg. Aortic valve area, by VTI measures 2.25 cm. Pulmonic Valve: The pulmonic valve was not well  visualized. Pulmonic valve regurgitation is not visualized. Aorta: The aortic root was not well visualized. IAS/Shunts: The interatrial septum was not well visualized.  LEFT VENTRICLE PLAX 2D LVIDd:         4.00 cm LVIDs:         2.70 cm LV PW:         0.90 cm LV IVS:        0.90 cm LVOT diam:     1.90 cm LV SV:         57 LV SV Index:   32 LVOT Area:     2.84 cm  RIGHT VENTRICLE TAPSE (M-mode): 2.3 cm LEFT ATRIUM             Index        RIGHT ATRIUM           Index LA Vol (A2C):   54.1 ml 29.96 ml/m  RA Area:     10.50 cm LA Vol (A4C):   16.3 ml 9.03 ml/m   RA Volume:   19.80 ml  10.96 ml/m LA Biplane Vol: 30.5 ml 16.89 ml/m  AORTIC VALVE AV Area (Vmax):    2.00 cm AV Area (Vmean):   1.98 cm AV Area (VTI):     2.25 cm AV Vmax:           173.00 cm/s AV Vmean:          114.000 cm/s AV VTI:            0.254 m AV Peak Grad:      12.0 mmHg AV Mean Grad:      6.0 mmHg LVOT Vmax:         122.00 cm/s LVOT Vmean:        79.700 cm/s LVOT VTI:          0.202 m LVOT/AV VTI ratio: 0.80 MITRAL VALVE MV Area (PHT): 4.19 cm     SHUNTS MV Decel Time: 181 msec     Systemic VTI:  0.20 m MV E velocity: 55.30 cm/s   Systemic Diam: 1.90 cm MV A velocity: 101.00 cm/s MV E/A ratio:  0.55 Epifanio Lesches MD Electronically signed by Epifanio Lesches MD Signature Date/Time: 09/15/2023/3:24:30 PM    Final     Anti-infectives: Anti-infectives (From admission, onward)    Start     Dose/Rate Route Frequency Ordered Stop   09/15/23 1115  ceFEPIme (MAXIPIME) 2 g in  sodium chloride 0.9 % 100 mL IVPB        2 g 200 mL/hr over 30 Minutes Intravenous Every 8 hours 09/15/23 1027     09/15/23 1100  metroNIDAZOLE (FLAGYL) IVPB 500 mg        500 mg 100 mL/hr over 60 Minutes Intravenous Every 12 hours 09/15/23 1009     09/11/23 2345  fluconazole (DIFLUCAN) IVPB 400 mg       Note to Pharmacy: Attention pharmacist: For 800 mg doses, change infusion duration to 240 min in verifcation via edit clinical information.  Placed in  "Followed by" Linked Group   400 mg 100 mL/hr over 120 Minutes Intravenous Every 24 hours 09/19/2023 2318     09/11/23 1245  metroNIDAZOLE (FLAGYL) IVPB 500 mg  Status:  Discontinued        500 mg 100 mL/hr over 60 Minutes Intravenous Every 12 hours 09/11/23 1148 09/14/23 1450   09/11/23 1245  Ampicillin-Sulbactam (UNASYN) 3 g in sodium chloride 0.9 % 100 mL IVPB  Status:  Discontinued        3 g 200 mL/hr over 30 Minutes Intravenous Every 6 hours 09/11/23 1148 09/15/23 1009   09/11/23 0600  cefoTEtan (CEFOTAN) 2 g in sodium chloride 0.9 % 100 mL IVPB        2 g 200 mL/hr over 30 Minutes Intravenous On call to O.R. 08/29/2023 2007 09/12/2023 2127   09/11/23 0000  azithromycin (ZITHROMAX) 500 mg in sodium chloride 0.9 % 250 mL IVPB  Status:  Discontinued        500 mg 250 mL/hr over 60 Minutes Intravenous Every 24 hours 09/25/2023 2330 09/11/23 1148   09/15/2023 2330  fluconazole (DIFLUCAN) IVPB 800 mg       Note to Pharmacy: Attention pharmacist: For 800 mg doses, change infusion duration to 240 min in verifcation via edit clinical information.  Placed in "Followed by" Linked Group   800 mg 100 mL/hr over 240 Minutes Intravenous  Once 09/22/2023 2318 09/11/23 0409   09/05/2023 2019  sodium chloride 0.9 % with cefoTEtan (CEFOTAN) ADS Med       Note to Pharmacy: Sherlene Shams: cabinet override      09/28/2023 2019 08/29/2023 2113   09/08/2023 1845  ceFEPIme (MAXIPIME) 2 g in sodium chloride 0.9 % 100 mL IVPB  Status:  Discontinued        2 g 200 mL/hr over 30 Minutes Intravenous Every 8 hours 08/31/2023 1840 09/11/23 1148   09/09/2023 1830  piperacillin-tazobactam (ZOSYN) IVPB 3.375 g  Status:  Discontinued        3.375 g 100 mL/hr over 30 Minutes Intravenous  Once 09/26/2023 1815 09/05/2023 1818   09/22/2023 1830  ceFEPIme (MAXIPIME) 2 g in sodium chloride 0.9 % 100 mL IVPB  Status:  Discontinued        2 g 200 mL/hr over 30 Minutes Intravenous  Once 09/09/2023 1818 09/20/2023 1840   09/03/2023 1830  metroNIDAZOLE  (FLAGYL) IVPB 500 mg        500 mg 100 mL/hr over 60 Minutes Intravenous  Once 09/24/2023 1818 08/29/2023 1955        Assessment/Plan Perforated gastric ulcer  POD6, s/p ex-lap with graham patch 10/13 Dr. Henreitta Leber - NGT replaced 10/19, AXR c/w ileus - Monitor bowel function and JP output - path c/w ulceration without malignancy or H.pylori on specimen - mobilize as able. - CBC today despite being ordered.  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    Moise Boring, MD [facility] 09/17/2023, 9:11 AM Please see Amion for pager number during day hours 7:00am-4:30pm

## 2023-09-17 NOTE — Progress Notes (Signed)
PHARMACY - TOTAL PARENTERAL NUTRITION CONSULT NOTE   Indication:  Gastric Ulcer Perforation  Patient Measurements: Height: 5\' 9"  (175.3 cm) Weight: 66.1 kg (145 lb 11.6 oz) IBW/kg (Calculated) : 70.7 TPN AdjBW (KG): 68 Body mass index is 21.52 kg/m. Usual Weight: 180 lbs.  Assessment:  50 YOM admitted for perforation of gastric ulcer after presenting (10/13) with several days of abdominal pain and distention which prevented him from eating or drinking except for small sips of water. Underwent exploratory laparotomy, primary repair of gastric ulcer (1.5 cm), biopsy, and graham patch at [facility] (10/13). PMHx significant for chronic constipation and thinning of abdominal wall with abdominal distention, COPD on home O2. Unable to speak with patient due to intubation.  Presumed moderate-to-high refeeding risk given temporal wasting per MD. Plan for NPO for at least 5 days post-op per team. Pharmacy consulted for TPN.  Unable to make customized TPN and utilizing Clinimix 8/10 due to IVF shortage caused by [facility] per hospital policy. Will not be able to meet full nutrition goals due to limited stock and fluid restrictions. Will replace electrolytes and give lipids outside of Clinimix.   10/16: Lytes trending down steadily, potentially refeeding, will continue to monitor.  Glucose / Insulin: No Hx of DM, A1c unknown, BG < 180 - no insulin Electrolytes: Phos 1.5 (down), CoCa 10.38, Mag 2.0, K 3.8, Na 143, Ch 108 Renal: Scr 0.62, BUN 33 (up) Hepatic: Albumin < 1.5, Tbili 0.1, AlkPhos 163, /AST/ALT wnl, TG 63 Intake / Output; MIVF: UOP 1.26 mL/kg/hr, NGT nothing charted, JP drain 60 mL  GI Imaging: 10/13 CT Abdomen/Pelvis: free air concerning for gastric or duodenal ulcer, likely acute cholecystitis  10/13 KUB: nonobstructive bowel gas 10/17 UGI: no contrast leak, no identifiable obstruction or small bowel abnormality  GI Surgeries/Procedures:  10/13: Ex-lap, graham patch, JP drain  and NGT placement 10/14: Extubated, propofol off  Central access: PICC 10/14 TPN start date: 10/14  Nutritional Goals: Clinimix E 8/10 1000 mL/day MW @ 42 mL/hr (provides 80 g protein and 664 kcal per day)  Clinimix E 8/10 2000 mL/day TTFriSatSun @ 82 mL/hr (provides 160 g protein and 1325 kcal per day) To provide an average of 137 g AA and 1586 Kcal per day.   RD Assessment:  Estimated Needs Total Energy Estimated Needs: 1900-2100 kcals Total Protein Estimated Needs: 110-125 g Total Fluid Estimated Needs: >/= 1.8 L  Current Nutrition:  TPN NPO  Plan:  Continue Clinimix E 8/10 at 1800: Will alternate: - per day (42 ml/hr) on M/Th - per day (86ml/hr) on Tu/Wed/Fri/Sat/Sun - Smof lipid to 250 ml daily To provide an average of 137 g AA and 1586 Kcal per day, meeting 100% of protein and ~83% of Kcal goals; lipids provide 28% of kcals. Unable to meet full nutrition requirements due to Clinimix and volume limitations.   Electrolytes:  - 1000 mL per day: Na 35 mEq, K 30 mEq, Mag 5 mEq, Phos 15 mmol, Acetate 83 mEq, Cl 76 mEq in Clinimix - 2000 mL per day: Na 70 mEq, K 60 mEq, Mag 10 mEq, Phos 30 mmol, Acetate 166 mEq, Cl 152 mEq in Clinimix  Additional: - Kphos 15 mmol x1 Add to clinimix standard MVI and trace elements, no chromium due to shortage Additional IVF per team Monitor TPN labs daily while on Clinimix F/u clear liquid diet trial given results of UGI   Keiry Kowal BS, PharmD, BCPS Clinical Pharmacist 09/17/2023 6:50 AM  Contact: [phone] after  3 PM  "Be curious, not judgmental..." -Debbora Dus

## 2023-09-17 NOTE — Plan of Care (Signed)
  Problem: Clinical Measurements: Goal: Will remain free from infection Outcome: Progressing Goal: Diagnostic test results will improve Outcome: Progressing Goal: Cardiovascular complication will be avoided Outcome: Progressing   

## 2023-09-17 NOTE — Progress Notes (Signed)
Daily Progress Note   Patient Name: Corey Griffin       Date: 09/17/2023 DOB: [birth year]  Age: 77 y.o. MRN#: [id number] Attending Physician: Zigmund Daniel., * Primary Care Physician: Benita Stabile, MD Admit Date: 09/17/2023  Reason for Consultation/Follow-up: Establishing goals of care   Length of Stay: 7  Current Medications: Scheduled Meds:   arformoterol  15 mcg Nebulization Q12H   bisacodyl  10 mg Rectal Daily   budesonide (PULMICORT) nebulizer solution  0.5 mg Nebulization BID   Chlorhexidine Gluconate Cloth  6 each Topical Daily   enoxaparin (LOVENOX) injection  40 mg Subcutaneous Q24H   feeding supplement  1 Container Oral TID BM   Gerhardt's butt cream   Topical TID   ipratropium-albuterol  3 mL Nebulization Once   leptospermum manuka honey  1 Application Topical Daily   lidocaine  3 patch Transdermal Q24H   pantoprazole (PROTONIX) IV  40 mg Intravenous Q12H   revefenacin  175 mcg Nebulization Daily   sodium chloride flush  10-40 mL Intracatheter Q12H   sorbitol, milk of mag, mineral oil, glycerin (SMOG) enema  960 mL Rectal Once    Continuous Infusions:  ceFEPime (MAXIPIME) IV 2 g (09/17/23 1212)   TPN (CLINIMIX-E) Adult     And   fat emul(SMOFlipid)     fluconazole (DIFLUCAN) IV 400 mg (09/17/23 0056)   metronidazole 500 mg (09/17/23 1217)   potassium PHOSPHATE IVPB (in mmol)     TPN (CLINIMIX) Adult without lytes 82 mL/hr at 09/16/23 1801    PRN Meds: acetaminophen, albuterol, alum & mag hydroxide-simeth, fentaNYL (SUBLIMAZE) injection, ondansetron (ZOFRAN) IV, oxymetazoline, sodium chloride flush, white petrolatum  Physical Exam Vitals reviewed.  Constitutional:      General: He is sleeping.     Comments: HFNC and NG tube  Cardiovascular:     Rate  and Rhythm: Normal rate.  Pulmonary:     Effort: Tachypnea present.  Skin:    General: Skin is warm and dry.     Findings: Bruising present.  Neurological:     Mental Status: He is oriented to person, place, and time and easily aroused.  Psychiatric:        Mood and Affect: Mood normal.        Behavior: Behavior normal.  Vital Signs: BP 113/68 (BP Location: Left Arm)   Pulse 91   Temp 98.9 F (37.2 C) (Oral)   Resp (!) 28   Ht 5\' 9"  (1.753 m)   Wt 66.1 kg   SpO2 97%   BMI 21.52 kg/m  SpO2: SpO2: 97 % O2 Device: O2 Device: Heated High Flow Nasal Cannula O2 Flow Rate: O2 Flow Rate (L/min): 40 L/min   Patient Active Problem List   Diagnosis Date Noted   DNR (do not resuscitate) 09/15/2023   DNI (do not intubate) 09/15/2023   Pleural effusion 09/15/2023   Protein-calorie malnutrition, severe 09/12/2023   Acute respiratory failure (HCC) 09/11/2023   Acute gastric ulcer with perforation (HCC) 08/31/2023   Sepsis (HCC) 09/28/2023   Bowel perforation (HCC) 09/07/2023   Gastric ulcer with perforation (HCC) 09/28/2023   COPD GOLD ? / MZ  02/07/2022   Chronic respiratory failure with hypoxia (HCC) 02/07/2022   Change in bowel habits    On home O2    Benign neoplasm of colon     Palliative Care Assessment & Plan   Patient Profile:  77 y.o. male  with past medical history of COPD on O2, chronic pain, and chronic constipation admitted on 09/11/2023 with abdominal distention and pain.    09/04/2023: CT abd/pelvis showed free air in stomach excision for perforated gastric ulcer. Surgery consulted. Taken to OR for ex lap with gastric ulcer repair, biopsy, graham patch placement. 10/17 PO trial of clears after UGI without evidence of contrast leak 10/17-18 overnight declined with increasing SOB and increasing O2 requirement -> lasix ordered 10/18 am greenish vomit, made NPO again 10/19 NG tube placed  Today's Discussion: Patient looks better today. He reports he  feels a little better after having a large bowel movement and having the fluid removed through his NG tube set to suction.  Patient's daughter Meribeth Mattes shares that she is hopeful he will continuing to improve but she is cautiously optimistic. She understands how acutely and chronically ill he is. We discussed that much of the patient's quality of life is through television watching and being on the computer. His independence is very important. She continues to want time for outcomes and is taking things one day at a time understanding the seriousness of his illnesses. We again discuss best case scenario would likely be him discharging to a nursing facility. Emotional support provided.  Encouraged family to call PMT with questions or concerns.  Recommendations/Plan: DNR Limited scope of care-- Do not intubate Time for outcomes- treat the treatable Encouraged continued discussion re: goals of care PMT will continue to support    Code Status:    Code Status Orders  (From admission, onward)           Start     Ordered   09/15/23 1141  Do not attempt resuscitation (DNR)- Limited -Do Not Intubate (DNI)  (Code Status)  Continuous       Question Answer Comment  If pulseless and not breathing No CPR or chest compressions.   In Pre-Arrest Conditions (Patient Is Breathing and Has A Pulse) Do not intubate. Provide all appropriate non-invasive medical interventions. Avoid ICU transfer unless indicated or required.   Consent: Discussion documented in EHR or advanced directives reviewed      09/15/23 1141         Extensive chart review has been completed prior to seeing the patient and his daughter including labs, vital signs, imaging, progress/consult notes, orders, medications, and available advance directive documents.  Time spent: 40 minutes  Thank you for allowing the Palliative Medicine Team to assist in the care of this patient.    Sherryll Burger, NP  Please contact Palliative  Medicine Team phone at [phone] for questions and concerns.

## 2023-09-17 NOTE — Progress Notes (Addendum)
Upper Lobe Opacities Suspected 2.6 cm Cavity  Unclear etiology Will consider CT scan  Will defer to pulm pulmonology   Leukocytosis Due to above  Rare candida albicans, candida glabrata, rare normal resp flora on resp culture  BC NGTD (10/13)  Abx as above   Sinus  Tachycardia Due to pain, above processes Will trend    Severe Protein Calorie Malnutrition TPN   Hypophosphatemia  Hypomagnesemia  Hypokalemia Replace and follow   Chronic Pain Takes oxy 10 q6 prn at home as needed   BPH  Flomax currently on hold   Stage III decub Appreciate wound care recs     DVT prophylaxis: lovenox Code Status: DNR Family Communication: daughter at bedside 10/20 Disposition:   Status is: Inpatient Remains inpatient appropriate because: pending improvement   Consultants:  PCCM Surgery  Procedures:  10/13  Exploratory laparotomy, primary repair  gastric ulcer (1.5cm), biopsy of ulcer edge, vascularized graham patch (omental patch)   Antimicrobials:  Anti-infectives (From admission, onward)    Start     Dose/Rate Route Frequency Ordered Stop   09/15/23 1115  ceFEPIme (MAXIPIME) 2 g in sodium chloride 0.9 % 100 mL IVPB        2 g 200 mL/hr over 30 Minutes Intravenous Every 8 hours 09/15/23 1027     09/15/23 1100  metroNIDAZOLE (FLAGYL) IVPB 500 mg        500 mg 100 mL/hr over 60 Minutes Intravenous Every 12 hours 09/15/23 1009     09/11/23 2345  fluconazole (DIFLUCAN) IVPB 400 mg       Note to Pharmacy: Attention pharmacist: For 800 mg doses, change infusion duration to 240 min in verifcation via edit clinical information.  Placed in "Followed by" Linked Group   400 mg 100 mL/hr over 120 Minutes Intravenous Every 24 hours 09/13/2023 2318     09/11/23 1245  metroNIDAZOLE (FLAGYL) IVPB 500 mg  Status:  Discontinued        500 mg 100 mL/hr over 60 Minutes Intravenous Every 12 hours 09/11/23 1148 09/14/23 1450   09/11/23 1245  Ampicillin-Sulbactam (UNASYN) 3 g in sodium chloride 0.9 % 100 mL IVPB  Status:  Discontinued        3 g 200 mL/hr over 30 Minutes Intravenous Every 6 hours 09/11/23 1148 09/15/23 1009   09/11/23 0600  cefoTEtan (CEFOTAN) 2 g in sodium chloride 0.9 % 100 mL IVPB        2 g 200 mL/hr over 30 Minutes Intravenous On call to  O.R. 09/17/2023 2007 09/27/2023 2127   09/11/23 0000  azithromycin (ZITHROMAX) 500 mg in sodium chloride 0.9 % 250 mL IVPB  Status:  Discontinued        500 mg 250 mL/hr over 60 Minutes Intravenous Every 24 hours 09/21/2023 2330 09/11/23 1148   09/03/2023 2330  fluconazole (DIFLUCAN) IVPB 800 mg       Note to Pharmacy: Attention pharmacist: For 800 mg doses, change infusion duration to 240 min in verifcation via edit clinical information.  Placed in "Followed by" Linked Group   800 mg 100 mL/hr over 240 Minutes Intravenous  Once 09/05/2023 2318 09/11/23 0409   09/21/2023 2019  sodium chloride 0.9 % with cefoTEtan (CEFOTAN) ADS Med       Note to Pharmacy: Sherlene Shams: cabinet override      09/11/2023 2019 09/09/2023 2113   09/27/2023 1845  ceFEPIme (MAXIPIME) 2 g in sodium chloride 0.9 % 100 mL IVPB  Status:  Discontinued  Upper Lobe Opacities Suspected 2.6 cm Cavity  Unclear etiology Will consider CT scan  Will defer to pulm pulmonology   Leukocytosis Due to above  Rare candida albicans, candida glabrata, rare normal resp flora on resp culture  BC NGTD (10/13)  Abx as above   Sinus  Tachycardia Due to pain, above processes Will trend    Severe Protein Calorie Malnutrition TPN   Hypophosphatemia  Hypomagnesemia  Hypokalemia Replace and follow   Chronic Pain Takes oxy 10 q6 prn at home as needed   BPH  Flomax currently on hold   Stage III decub Appreciate wound care recs     DVT prophylaxis: lovenox Code Status: DNR Family Communication: daughter at bedside 10/20 Disposition:   Status is: Inpatient Remains inpatient appropriate because: pending improvement   Consultants:  PCCM Surgery  Procedures:  10/13  Exploratory laparotomy, primary repair  gastric ulcer (1.5cm), biopsy of ulcer edge, vascularized graham patch (omental patch)   Antimicrobials:  Anti-infectives (From admission, onward)    Start     Dose/Rate Route Frequency Ordered Stop   09/15/23 1115  ceFEPIme (MAXIPIME) 2 g in sodium chloride 0.9 % 100 mL IVPB        2 g 200 mL/hr over 30 Minutes Intravenous Every 8 hours 09/15/23 1027     09/15/23 1100  metroNIDAZOLE (FLAGYL) IVPB 500 mg        500 mg 100 mL/hr over 60 Minutes Intravenous Every 12 hours 09/15/23 1009     09/11/23 2345  fluconazole (DIFLUCAN) IVPB 400 mg       Note to Pharmacy: Attention pharmacist: For 800 mg doses, change infusion duration to 240 min in verifcation via edit clinical information.  Placed in "Followed by" Linked Group   400 mg 100 mL/hr over 120 Minutes Intravenous Every 24 hours 09/13/2023 2318     09/11/23 1245  metroNIDAZOLE (FLAGYL) IVPB 500 mg  Status:  Discontinued        500 mg 100 mL/hr over 60 Minutes Intravenous Every 12 hours 09/11/23 1148 09/14/23 1450   09/11/23 1245  Ampicillin-Sulbactam (UNASYN) 3 g in sodium chloride 0.9 % 100 mL IVPB  Status:  Discontinued        3 g 200 mL/hr over 30 Minutes Intravenous Every 6 hours 09/11/23 1148 09/15/23 1009   09/11/23 0600  cefoTEtan (CEFOTAN) 2 g in sodium chloride 0.9 % 100 mL IVPB        2 g 200 mL/hr over 30 Minutes Intravenous On call to  O.R. 09/17/2023 2007 09/27/2023 2127   09/11/23 0000  azithromycin (ZITHROMAX) 500 mg in sodium chloride 0.9 % 250 mL IVPB  Status:  Discontinued        500 mg 250 mL/hr over 60 Minutes Intravenous Every 24 hours 09/21/2023 2330 09/11/23 1148   09/03/2023 2330  fluconazole (DIFLUCAN) IVPB 800 mg       Note to Pharmacy: Attention pharmacist: For 800 mg doses, change infusion duration to 240 min in verifcation via edit clinical information.  Placed in "Followed by" Linked Group   800 mg 100 mL/hr over 240 Minutes Intravenous  Once 09/05/2023 2318 09/11/23 0409   09/21/2023 2019  sodium chloride 0.9 % with cefoTEtan (CEFOTAN) ADS Med       Note to Pharmacy: Sherlene Shams: cabinet override      09/11/2023 2019 09/09/2023 2113   09/27/2023 1845  ceFEPIme (MAXIPIME) 2 g in sodium chloride 0.9 % 100 mL IVPB  Status:  Discontinued  [place] [id number]    Gram Stain NO WBC SEEN RARE YEAST   Final   Culture   Final    RARE CANDIDA ALBICANS RARE CANDIDA GLABRATA NO ANAEROBES ISOLATED Performed at [facility], [address], [place], [place] [address]    Report Status 09/16/2023 FINAL  Final  MRSA Next Gen by PCR, Nasal     Status: None   Collection Time: September 30, 2023 11:17 PM  Result Value Ref Range Status   MRSA by PCR Next Gen NOT DETECTED NOT DETECTED Final    Comment: (NOTE) The GeneXpert MRSA Assay (FDA approved for NASAL specimens only), is one component of [facility] comprehensive MRSA colonization surveillance program. It is not intended to diagnose MRSA infection nor to guide or monitor treatment for MRSA infections. Test performance is not  FDA approved in patients less than 51 years old. Performed at [facility], [address] [place], [place] [address]   Culture, Respiratory w Gram Stain     Status: None   Collection Time: 09/11/23 12:30 AM   Specimen: Sputum  Result Value Ref Range Status   Specimen Description SPU  Final   Special Requests NONE  Final   Gram Stain   Final    ABUNDANT WBC PRESENT, PREDOMINANTLY PMN RARE GRAM POSITIVE COCCI IN PAIRS    Culture   Final    RARE Normal respiratory flora-no Staph aureus or Pseudomonas seen Performed at [facility], [address], [place], [place] [address]    Report Status 09/15/2023 FINAL  Final         Radiology Studies: DG Abd 1 View  Result Date: 09/16/2023 CLINICAL DATA:  Abdominal distension.  NG tube placement. EXAM: ABDOMEN - 1 VIEW COMPARISON:  Earlier today FINDINGS: Two portable supine views of the abdomen obtained. Repositioning of the enteric tube, second image demonstrates the tip and side-port below the diaphragm in the stomach. There is contrast in the colon from prior upper GI. Drain in the upper abdomen. Air within the central upper abdomen may represent gaseous gastric distension, however free air could have Keyoni Lapinski similar appearance. The amount of air is greater than would be expected 6 days post laparotomy. Skin staples in place. IMPRESSION: 1. Repositioning of the enteric tube, tip and side-port are below the diaphragm in the stomach. 2. Air within the central upper abdomen may represent gaseous gastric distension, however free air could have Ouida Abeyta similar appearance. CT would be the study of choice for further assessment, however CT would be of likely limited value this time due to the significant dense contrast in the colon which would cause severe streak artifact and limit assessment. Continued clinical follow-up recommended. Electronically Signed   By: Narda Rutherford M.D.   On: 09/16/2023 23:06   DG Abd 1 View  Result Date:  09/16/2023 CLINICAL DATA:  NG tube placement due to postoperative complication from gastric ulcer perforation. EXAM: ABDOMEN - 1 VIEW COMPARISON:  09/16/2023 FINDINGS: Limited field of view for tube placement purposes. An enteric tube has been placed. The tip is coiled back on itself and projected over the distal esophagus. Advancement is recommended if placement in the stomach is desired. Skin clips and surgical drains in the upper abdomen consistent with recent surgery. Infiltration or atelectasis in the right lung base. IMPRESSION: Enteric tube tip is coiled back on itself and located over the distal esophagus. Advancement is recommended if placement in the stomach is desired. Electronically Signed   By: Chrissie Noa  [place] [id number]    Gram Stain NO WBC SEEN RARE YEAST   Final   Culture   Final    RARE CANDIDA ALBICANS RARE CANDIDA GLABRATA NO ANAEROBES ISOLATED Performed at [facility], [address], [place], [place] [address]    Report Status 09/16/2023 FINAL  Final  MRSA Next Gen by PCR, Nasal     Status: None   Collection Time: September 30, 2023 11:17 PM  Result Value Ref Range Status   MRSA by PCR Next Gen NOT DETECTED NOT DETECTED Final    Comment: (NOTE) The GeneXpert MRSA Assay (FDA approved for NASAL specimens only), is one component of [facility] comprehensive MRSA colonization surveillance program. It is not intended to diagnose MRSA infection nor to guide or monitor treatment for MRSA infections. Test performance is not  FDA approved in patients less than 51 years old. Performed at [facility], [address] [place], [place] [address]   Culture, Respiratory w Gram Stain     Status: None   Collection Time: 09/11/23 12:30 AM   Specimen: Sputum  Result Value Ref Range Status   Specimen Description SPU  Final   Special Requests NONE  Final   Gram Stain   Final    ABUNDANT WBC PRESENT, PREDOMINANTLY PMN RARE GRAM POSITIVE COCCI IN PAIRS    Culture   Final    RARE Normal respiratory flora-no Staph aureus or Pseudomonas seen Performed at [facility], [address], [place], [place] [address]    Report Status 09/15/2023 FINAL  Final         Radiology Studies: DG Abd 1 View  Result Date: 09/16/2023 CLINICAL DATA:  Abdominal distension.  NG tube placement. EXAM: ABDOMEN - 1 VIEW COMPARISON:  Earlier today FINDINGS: Two portable supine views of the abdomen obtained. Repositioning of the enteric tube, second image demonstrates the tip and side-port below the diaphragm in the stomach. There is contrast in the colon from prior upper GI. Drain in the upper abdomen. Air within the central upper abdomen may represent gaseous gastric distension, however free air could have Keyoni Lapinski similar appearance. The amount of air is greater than would be expected 6 days post laparotomy. Skin staples in place. IMPRESSION: 1. Repositioning of the enteric tube, tip and side-port are below the diaphragm in the stomach. 2. Air within the central upper abdomen may represent gaseous gastric distension, however free air could have Ouida Abeyta similar appearance. CT would be the study of choice for further assessment, however CT would be of likely limited value this time due to the significant dense contrast in the colon which would cause severe streak artifact and limit assessment. Continued clinical follow-up recommended. Electronically Signed   By: Narda Rutherford M.D.   On: 09/16/2023 23:06   DG Abd 1 View  Result Date:  09/16/2023 CLINICAL DATA:  NG tube placement due to postoperative complication from gastric ulcer perforation. EXAM: ABDOMEN - 1 VIEW COMPARISON:  09/16/2023 FINDINGS: Limited field of view for tube placement purposes. An enteric tube has been placed. The tip is coiled back on itself and projected over the distal esophagus. Advancement is recommended if placement in the stomach is desired. Skin clips and surgical drains in the upper abdomen consistent with recent surgery. Infiltration or atelectasis in the right lung base. IMPRESSION: Enteric tube tip is coiled back on itself and located over the distal esophagus. Advancement is recommended if placement in the stomach is desired. Electronically Signed   By: Chrissie Noa  [place] [id number]    Gram Stain NO WBC SEEN RARE YEAST   Final   Culture   Final    RARE CANDIDA ALBICANS RARE CANDIDA GLABRATA NO ANAEROBES ISOLATED Performed at [facility], [address], [place], [place] [address]    Report Status 09/16/2023 FINAL  Final  MRSA Next Gen by PCR, Nasal     Status: None   Collection Time: September 30, 2023 11:17 PM  Result Value Ref Range Status   MRSA by PCR Next Gen NOT DETECTED NOT DETECTED Final    Comment: (NOTE) The GeneXpert MRSA Assay (FDA approved for NASAL specimens only), is one component of [facility] comprehensive MRSA colonization surveillance program. It is not intended to diagnose MRSA infection nor to guide or monitor treatment for MRSA infections. Test performance is not  FDA approved in patients less than 51 years old. Performed at [facility], [address] [place], [place] [address]   Culture, Respiratory w Gram Stain     Status: None   Collection Time: 09/11/23 12:30 AM   Specimen: Sputum  Result Value Ref Range Status   Specimen Description SPU  Final   Special Requests NONE  Final   Gram Stain   Final    ABUNDANT WBC PRESENT, PREDOMINANTLY PMN RARE GRAM POSITIVE COCCI IN PAIRS    Culture   Final    RARE Normal respiratory flora-no Staph aureus or Pseudomonas seen Performed at [facility], [address], [place], [place] [address]    Report Status 09/15/2023 FINAL  Final         Radiology Studies: DG Abd 1 View  Result Date: 09/16/2023 CLINICAL DATA:  Abdominal distension.  NG tube placement. EXAM: ABDOMEN - 1 VIEW COMPARISON:  Earlier today FINDINGS: Two portable supine views of the abdomen obtained. Repositioning of the enteric tube, second image demonstrates the tip and side-port below the diaphragm in the stomach. There is contrast in the colon from prior upper GI. Drain in the upper abdomen. Air within the central upper abdomen may represent gaseous gastric distension, however free air could have Keyoni Lapinski similar appearance. The amount of air is greater than would be expected 6 days post laparotomy. Skin staples in place. IMPRESSION: 1. Repositioning of the enteric tube, tip and side-port are below the diaphragm in the stomach. 2. Air within the central upper abdomen may represent gaseous gastric distension, however free air could have Ouida Abeyta similar appearance. CT would be the study of choice for further assessment, however CT would be of likely limited value this time due to the significant dense contrast in the colon which would cause severe streak artifact and limit assessment. Continued clinical follow-up recommended. Electronically Signed   By: Narda Rutherford M.D.   On: 09/16/2023 23:06   DG Abd 1 View  Result Date:  09/16/2023 CLINICAL DATA:  NG tube placement due to postoperative complication from gastric ulcer perforation. EXAM: ABDOMEN - 1 VIEW COMPARISON:  09/16/2023 FINDINGS: Limited field of view for tube placement purposes. An enteric tube has been placed. The tip is coiled back on itself and projected over the distal esophagus. Advancement is recommended if placement in the stomach is desired. Skin clips and surgical drains in the upper abdomen consistent with recent surgery. Infiltration or atelectasis in the right lung base. IMPRESSION: Enteric tube tip is coiled back on itself and located over the distal esophagus. Advancement is recommended if placement in the stomach is desired. Electronically Signed   By: Chrissie Noa  [place] [id number]    Gram Stain NO WBC SEEN RARE YEAST   Final   Culture   Final    RARE CANDIDA ALBICANS RARE CANDIDA GLABRATA NO ANAEROBES ISOLATED Performed at [facility], [address], [place], [place] [address]    Report Status 09/16/2023 FINAL  Final  MRSA Next Gen by PCR, Nasal     Status: None   Collection Time: September 30, 2023 11:17 PM  Result Value Ref Range Status   MRSA by PCR Next Gen NOT DETECTED NOT DETECTED Final    Comment: (NOTE) The GeneXpert MRSA Assay (FDA approved for NASAL specimens only), is one component of [facility] comprehensive MRSA colonization surveillance program. It is not intended to diagnose MRSA infection nor to guide or monitor treatment for MRSA infections. Test performance is not  FDA approved in patients less than 51 years old. Performed at [facility], [address] [place], [place] [address]   Culture, Respiratory w Gram Stain     Status: None   Collection Time: 09/11/23 12:30 AM   Specimen: Sputum  Result Value Ref Range Status   Specimen Description SPU  Final   Special Requests NONE  Final   Gram Stain   Final    ABUNDANT WBC PRESENT, PREDOMINANTLY PMN RARE GRAM POSITIVE COCCI IN PAIRS    Culture   Final    RARE Normal respiratory flora-no Staph aureus or Pseudomonas seen Performed at [facility], [address], [place], [place] [address]    Report Status 09/15/2023 FINAL  Final         Radiology Studies: DG Abd 1 View  Result Date: 09/16/2023 CLINICAL DATA:  Abdominal distension.  NG tube placement. EXAM: ABDOMEN - 1 VIEW COMPARISON:  Earlier today FINDINGS: Two portable supine views of the abdomen obtained. Repositioning of the enteric tube, second image demonstrates the tip and side-port below the diaphragm in the stomach. There is contrast in the colon from prior upper GI. Drain in the upper abdomen. Air within the central upper abdomen may represent gaseous gastric distension, however free air could have Keyoni Lapinski similar appearance. The amount of air is greater than would be expected 6 days post laparotomy. Skin staples in place. IMPRESSION: 1. Repositioning of the enteric tube, tip and side-port are below the diaphragm in the stomach. 2. Air within the central upper abdomen may represent gaseous gastric distension, however free air could have Ouida Abeyta similar appearance. CT would be the study of choice for further assessment, however CT would be of likely limited value this time due to the significant dense contrast in the colon which would cause severe streak artifact and limit assessment. Continued clinical follow-up recommended. Electronically Signed   By: Narda Rutherford M.D.   On: 09/16/2023 23:06   DG Abd 1 View  Result Date:  09/16/2023 CLINICAL DATA:  NG tube placement due to postoperative complication from gastric ulcer perforation. EXAM: ABDOMEN - 1 VIEW COMPARISON:  09/16/2023 FINDINGS: Limited field of view for tube placement purposes. An enteric tube has been placed. The tip is coiled back on itself and projected over the distal esophagus. Advancement is recommended if placement in the stomach is desired. Skin clips and surgical drains in the upper abdomen consistent with recent surgery. Infiltration or atelectasis in the right lung base. IMPRESSION: Enteric tube tip is coiled back on itself and located over the distal esophagus. Advancement is recommended if placement in the stomach is desired. Electronically Signed   By: Chrissie Noa  Upper Lobe Opacities Suspected 2.6 cm Cavity  Unclear etiology Will consider CT scan  Will defer to pulm pulmonology   Leukocytosis Due to above  Rare candida albicans, candida glabrata, rare normal resp flora on resp culture  BC NGTD (10/13)  Abx as above   Sinus  Tachycardia Due to pain, above processes Will trend    Severe Protein Calorie Malnutrition TPN   Hypophosphatemia  Hypomagnesemia  Hypokalemia Replace and follow   Chronic Pain Takes oxy 10 q6 prn at home as needed   BPH  Flomax currently on hold   Stage III decub Appreciate wound care recs     DVT prophylaxis: lovenox Code Status: DNR Family Communication: daughter at bedside 10/20 Disposition:   Status is: Inpatient Remains inpatient appropriate because: pending improvement   Consultants:  PCCM Surgery  Procedures:  10/13  Exploratory laparotomy, primary repair  gastric ulcer (1.5cm), biopsy of ulcer edge, vascularized graham patch (omental patch)   Antimicrobials:  Anti-infectives (From admission, onward)    Start     Dose/Rate Route Frequency Ordered Stop   09/15/23 1115  ceFEPIme (MAXIPIME) 2 g in sodium chloride 0.9 % 100 mL IVPB        2 g 200 mL/hr over 30 Minutes Intravenous Every 8 hours 09/15/23 1027     09/15/23 1100  metroNIDAZOLE (FLAGYL) IVPB 500 mg        500 mg 100 mL/hr over 60 Minutes Intravenous Every 12 hours 09/15/23 1009     09/11/23 2345  fluconazole (DIFLUCAN) IVPB 400 mg       Note to Pharmacy: Attention pharmacist: For 800 mg doses, change infusion duration to 240 min in verifcation via edit clinical information.  Placed in "Followed by" Linked Group   400 mg 100 mL/hr over 120 Minutes Intravenous Every 24 hours 09/13/2023 2318     09/11/23 1245  metroNIDAZOLE (FLAGYL) IVPB 500 mg  Status:  Discontinued        500 mg 100 mL/hr over 60 Minutes Intravenous Every 12 hours 09/11/23 1148 09/14/23 1450   09/11/23 1245  Ampicillin-Sulbactam (UNASYN) 3 g in sodium chloride 0.9 % 100 mL IVPB  Status:  Discontinued        3 g 200 mL/hr over 30 Minutes Intravenous Every 6 hours 09/11/23 1148 09/15/23 1009   09/11/23 0600  cefoTEtan (CEFOTAN) 2 g in sodium chloride 0.9 % 100 mL IVPB        2 g 200 mL/hr over 30 Minutes Intravenous On call to  O.R. 09/17/2023 2007 09/27/2023 2127   09/11/23 0000  azithromycin (ZITHROMAX) 500 mg in sodium chloride 0.9 % 250 mL IVPB  Status:  Discontinued        500 mg 250 mL/hr over 60 Minutes Intravenous Every 24 hours 09/21/2023 2330 09/11/23 1148   09/03/2023 2330  fluconazole (DIFLUCAN) IVPB 800 mg       Note to Pharmacy: Attention pharmacist: For 800 mg doses, change infusion duration to 240 min in verifcation via edit clinical information.  Placed in "Followed by" Linked Group   800 mg 100 mL/hr over 240 Minutes Intravenous  Once 09/05/2023 2318 09/11/23 0409   09/21/2023 2019  sodium chloride 0.9 % with cefoTEtan (CEFOTAN) ADS Med       Note to Pharmacy: Sherlene Shams: cabinet override      09/11/2023 2019 09/09/2023 2113   09/27/2023 1845  ceFEPIme (MAXIPIME) 2 g in sodium chloride 0.9 % 100 mL IVPB  Status:  Discontinued  Upper Lobe Opacities Suspected 2.6 cm Cavity  Unclear etiology Will consider CT scan  Will defer to pulm pulmonology   Leukocytosis Due to above  Rare candida albicans, candida glabrata, rare normal resp flora on resp culture  BC NGTD (10/13)  Abx as above   Sinus  Tachycardia Due to pain, above processes Will trend    Severe Protein Calorie Malnutrition TPN   Hypophosphatemia  Hypomagnesemia  Hypokalemia Replace and follow   Chronic Pain Takes oxy 10 q6 prn at home as needed   BPH  Flomax currently on hold   Stage III decub Appreciate wound care recs     DVT prophylaxis: lovenox Code Status: DNR Family Communication: daughter at bedside 10/20 Disposition:   Status is: Inpatient Remains inpatient appropriate because: pending improvement   Consultants:  PCCM Surgery  Procedures:  10/13  Exploratory laparotomy, primary repair  gastric ulcer (1.5cm), biopsy of ulcer edge, vascularized graham patch (omental patch)   Antimicrobials:  Anti-infectives (From admission, onward)    Start     Dose/Rate Route Frequency Ordered Stop   09/15/23 1115  ceFEPIme (MAXIPIME) 2 g in sodium chloride 0.9 % 100 mL IVPB        2 g 200 mL/hr over 30 Minutes Intravenous Every 8 hours 09/15/23 1027     09/15/23 1100  metroNIDAZOLE (FLAGYL) IVPB 500 mg        500 mg 100 mL/hr over 60 Minutes Intravenous Every 12 hours 09/15/23 1009     09/11/23 2345  fluconazole (DIFLUCAN) IVPB 400 mg       Note to Pharmacy: Attention pharmacist: For 800 mg doses, change infusion duration to 240 min in verifcation via edit clinical information.  Placed in "Followed by" Linked Group   400 mg 100 mL/hr over 120 Minutes Intravenous Every 24 hours 09/13/2023 2318     09/11/23 1245  metroNIDAZOLE (FLAGYL) IVPB 500 mg  Status:  Discontinued        500 mg 100 mL/hr over 60 Minutes Intravenous Every 12 hours 09/11/23 1148 09/14/23 1450   09/11/23 1245  Ampicillin-Sulbactam (UNASYN) 3 g in sodium chloride 0.9 % 100 mL IVPB  Status:  Discontinued        3 g 200 mL/hr over 30 Minutes Intravenous Every 6 hours 09/11/23 1148 09/15/23 1009   09/11/23 0600  cefoTEtan (CEFOTAN) 2 g in sodium chloride 0.9 % 100 mL IVPB        2 g 200 mL/hr over 30 Minutes Intravenous On call to  O.R. 09/17/2023 2007 09/27/2023 2127   09/11/23 0000  azithromycin (ZITHROMAX) 500 mg in sodium chloride 0.9 % 250 mL IVPB  Status:  Discontinued        500 mg 250 mL/hr over 60 Minutes Intravenous Every 24 hours 09/21/2023 2330 09/11/23 1148   09/03/2023 2330  fluconazole (DIFLUCAN) IVPB 800 mg       Note to Pharmacy: Attention pharmacist: For 800 mg doses, change infusion duration to 240 min in verifcation via edit clinical information.  Placed in "Followed by" Linked Group   800 mg 100 mL/hr over 240 Minutes Intravenous  Once 09/05/2023 2318 09/11/23 0409   09/21/2023 2019  sodium chloride 0.9 % with cefoTEtan (CEFOTAN) ADS Med       Note to Pharmacy: Sherlene Shams: cabinet override      09/11/2023 2019 09/09/2023 2113   09/27/2023 1845  ceFEPIme (MAXIPIME) 2 g in sodium chloride 0.9 % 100 mL IVPB  Status:  Discontinued

## 2023-09-18 DIAGNOSIS — K251 Acute gastric ulcer with perforation: Secondary | ICD-10-CM | POA: Diagnosis not present

## 2023-09-18 LAB — COMPREHENSIVE METABOLIC PANEL
ALT: 22 U/L (ref 0–44)
AST: 23 U/L (ref 15–41)
Albumin: <1.5 g/dL — ABNORMAL LOW (ref 3.5–5.0)
Alkaline Phosphatase: 172 U/L — ABNORMAL HIGH (ref 38–126)
Anion gap: 4 — ABNORMAL LOW (ref 5–15)
BUN: 35 mg/dL — ABNORMAL HIGH (ref 8–23)
CO2: 32 mmol/L (ref 22–32)
Calcium: 9.4 mg/dL (ref 8.9–10.3)
Chloride: 106 mmol/L (ref 98–111)
Creatinine, Ser: 0.57 mg/dL — ABNORMAL LOW (ref 0.61–1.24)
GFR, Estimated: >60 mL/min (ref >60)
Glucose, Bld: 164 mg/dL — ABNORMAL HIGH (ref 70–99)
Potassium: 4.1 mmol/L (ref 3.5–5.1)
Sodium: 142 mmol/L (ref 135–145)
Total Bilirubin: 0.4 mg/dL (ref 0.3–1.2)
Total Protein: 5.7 g/dL — ABNORMAL LOW (ref 6.5–8.1)

## 2023-09-18 LAB — CBC
HCT: 34.2 % — ABNORMAL LOW (ref 39.0–52.0)
Hemoglobin: 10 g/dL — ABNORMAL LOW (ref 13.0–17.0)
MCH: 31.1 pg (ref 26.0–34.0)
MCHC: 29.2 g/dL — ABNORMAL LOW (ref 30.0–36.0)
MCV: 106.2 fL — ABNORMAL HIGH (ref 80.0–100.0)
Platelets: 478 10*3/uL — ABNORMAL HIGH (ref 150–400)
RBC: 3.22 MIL/uL — ABNORMAL LOW (ref 4.22–5.81)
RDW: 16.5 % — ABNORMAL HIGH (ref 11.5–15.5)
WBC: 28.8 10*3/uL — ABNORMAL HIGH (ref 4.0–10.5)
nRBC: 0 % (ref 0.0–0.2)

## 2023-09-18 LAB — MAGNESIUM: Magnesium: 2 mg/dL (ref 1.7–2.4)

## 2023-09-18 LAB — PHOSPHORUS: Phosphorus: 2.4 mg/dL — ABNORMAL LOW (ref 2.5–4.6)

## 2023-09-18 LAB — TRIGLYCERIDES: Triglycerides: 91 mg/dL (ref <150)

## 2023-09-18 LAB — GLUCOSE, CAPILLARY: Glucose-Capillary: 159 mg/dL — ABNORMAL HIGH (ref 70–99)

## 2023-09-18 MED ORDER — ACETAMINOPHEN 325 MG PO TABS: 650.0000 mg | ORAL_TABLET | Freq: Four times a day (QID) | ORAL | Status: DC | PRN

## 2023-09-18 MED ORDER — FAT EMUL FISH OIL/PLANT BASED 20% (SMOFLIPID)IV EMUL: 250.0000 mL | INTRAVENOUS | Status: DC

## 2023-09-18 MED ORDER — TRACE MINERALS CU-MN-SE-ZN 300-55-60-3000 MCG/ML IV SOLN: INTRAVENOUS | Status: DC

## 2023-09-18 MED ORDER — HALOPERIDOL LACTATE 5 MG/ML IJ SOLN: 0.5000 mg | INTRAMUSCULAR | Status: DC | PRN

## 2023-09-18 MED ORDER — POLYVINYL ALCOHOL 1.4 % OP SOLN: 1.0000 [drp] | Freq: Four times a day (QID) | OPHTHALMIC | Status: DC | PRN

## 2023-09-18 MED ORDER — FENTANYL CITRATE PF 50 MCG/ML IJ SOSY: 12.5000 ug | PREFILLED_SYRINGE | INTRAMUSCULAR | Status: DC | PRN

## 2023-09-18 MED ORDER — MORPHINE 100MG IN NS 100ML (1MG/ML) PREMIX INFUSION
1.0000 mg/h | INTRAVENOUS | Status: DC
Start: 2023-09-18 — End: 2023-09-18

## 2023-09-18 MED ORDER — GLYCOPYRROLATE 0.2 MG/ML IJ SOLN: 0.2000 mg | INTRAMUSCULAR | Status: DC | PRN

## 2023-09-18 MED ORDER — POTASSIUM PHOSPHATES 15 MMOLE/5ML IV SOLN
15.0000 mmol | Freq: Once | INTRAVENOUS | Status: DC
  Filled 2023-09-18: qty 5

## 2023-09-18 MED ORDER — MORPHINE BOLUS VIA INFUSION: 1.0000 mg | INTRAVENOUS | Status: DC | PRN

## 2023-09-18 MED ORDER — HALOPERIDOL LACTATE 2 MG/ML PO CONC: 0.5000 mg | ORAL | Status: DC | PRN

## 2023-09-18 MED ORDER — BIOTENE DRY MOUTH MT LIQD: 15.0000 mL | OROMUCOSAL | Status: DC | PRN

## 2023-09-18 MED ORDER — ACETAMINOPHEN 650 MG RE SUPP: 650.0000 mg | Freq: Four times a day (QID) | RECTAL | Status: DC | PRN

## 2023-09-18 MED ORDER — HALOPERIDOL 0.5 MG PO TABS: 0.5000 mg | ORAL_TABLET | ORAL | Status: DC | PRN

## 2023-09-18 MED ORDER — GLYCOPYRROLATE 1 MG PO TABS: 1.0000 mg | ORAL_TABLET | ORAL | Status: DC | PRN

## 2023-09-29 NOTE — Progress Notes (Signed)
Orthopedic Tech Progress Note Patient Details:  [name] 1946-07-12 [id number]  Patient ID: Corey Griffin, male   DOB: July 08, 1946, 77 y.o.   MRN: [id number] Per nursing pt is now comfort care.  Will hold off on unna boots unless MD/family decide they would like them.    Mixtli Reno OTR/L 2023-09-30, 8:54 AM

## 2023-09-29 NOTE — Progress Notes (Signed)
PT Cancellation Note  Patient Details Name: Corey Griffin MRN: [id number] DOB: 1946-03-09   Cancelled Treatment:    Reason Eval/Treat Not Completed: Medical issues which prohibited therapy (Family meeting regarding possible comfort care. Will check back tomorrow to see what was decided.)   Bevelyn Buckles 09/21/2023, 10:38 AM Samiyyah Moffa M,PT Acute Rehab Services [phone]

## 2023-09-29 NOTE — Progress Notes (Signed)
8 Days Post-Op   Subjective/Chief Complaint: unresponsive   Objective: Vital signs in last 24 hours: Temp:  [98.1 F (36.7 C)-98.9 F (37.2 C)] 98.9 F (37.2 C) Oct 10, 2023 0335) Pulse Rate:  [97-109] 102 10-10-23 0335) Resp:  [16-34] 16 10/10/2023 0335) BP: (96-118)/(58-66) 109/63 10/10/23 0335) SpO2:  [92 %-97 %] 95 % October 10, 2023 0758) FiO2 (%):  [60 %-75 %] 60 % 10/10/23 0758) Last BM Date : 09/17/23  Intake/Output from previous day: 10/20 0701 10-Oct-2023 0700 In: 1161.9 [I.V.:583.6; IV Piggyback:578.3] Out: 620 [Urine:550; Drains:70] Intake/Output this shift: No intake/output data recorded.  Ab soft incision intact, jp serous  Lab Results:  Recent Labs    09/17/23 0925 2023/10/10 0500  WBC 26.8* 28.8*  HGB 9.5* 10.0*  HCT 32.1* 34.2*  PLT 415* 478*   BMET Recent Labs    09/17/23 0925 2023/10/10 0500  NA 143 142  K 3.8 4.1  CL 108 106  CO2 31 32  GLUCOSE 148* 164*  BUN 33* 35*  CREATININE 0.55* 0.57*  CALCIUM 8.6* 9.4   PT/INR No results for input(s): "LABPROT", "INR" in the last 72 hours. ABG Recent Labs    09/16/23 1000  HCO3 36.9*    Studies/Results: DG Abd 1 View  Result Date: 09/16/2023 CLINICAL DATA:  Abdominal distension.  NG tube placement. EXAM: ABDOMEN - 1 VIEW COMPARISON:  Earlier today FINDINGS: Two portable supine views of the abdomen obtained. Repositioning of the enteric tube, second image demonstrates the tip and side-port below the diaphragm in the stomach. There is contrast in the colon from prior upper GI. Drain in the upper abdomen. Air within the central upper abdomen may represent gaseous gastric distension, however free air could have a similar appearance. The amount of air is greater than would be expected 6 days post laparotomy. Skin staples in place. IMPRESSION: 1. Repositioning of the enteric tube, tip and side-port are below the diaphragm in the stomach. 2. Air within the central upper abdomen may represent gaseous gastric distension, however free  air could have a similar appearance. CT would be the study of choice for further assessment, however CT would be of likely limited value this time due to the significant dense contrast in the colon which would cause severe streak artifact and limit assessment. Continued clinical follow-up recommended. Electronically Signed   By: Narda Rutherford M.D.   On: 09/16/2023 23:06   DG Abd 1 View  Result Date: 09/16/2023 CLINICAL DATA:  NG tube placement due to postoperative complication from gastric ulcer perforation. EXAM: ABDOMEN - 1 VIEW COMPARISON:  09/16/2023 FINDINGS: Limited field of view for tube placement purposes. An enteric tube has been placed. The tip is coiled back on itself and projected over the distal esophagus. Advancement is recommended if placement in the stomach is desired. Skin clips and surgical drains in the upper abdomen consistent with recent surgery. Infiltration or atelectasis in the right lung base. IMPRESSION: Enteric tube tip is coiled back on itself and located over the distal esophagus. Advancement is recommended if placement in the stomach is desired. Electronically Signed   By: Burman Nieves M.D.   On: 09/16/2023 19:02    Anti-infectives: Anti-infectives (From admission, onward)    Start     Dose/Rate Route Frequency Ordered Stop   09/15/23 1115  ceFEPIme (MAXIPIME) 2 g in sodium chloride 0.9 % 100 mL IVPB  Status:  Discontinued        2 g 200 mL/hr over 30 Minutes Intravenous Every 8 hours 09/15/23 1027  09/20/2023 1103   09/15/23 1100  metroNIDAZOLE (FLAGYL) IVPB 500 mg  Status:  Discontinued        500 mg 100 mL/hr over 60 Minutes Intravenous Every 12 hours 09/15/23 1009 2023-09-20 1103   09/11/23 2345  fluconazole (DIFLUCAN) IVPB 400 mg  Status:  Discontinued       Note to Pharmacy: Attention pharmacist: For 800 mg doses, change infusion duration to 240 min in verifcation via edit clinical information.  Placed in "Followed by" Linked Group   400 mg 100 mL/hr over  120 Minutes Intravenous Every 24 hours 09/12/2023 2318 20-Sep-2023 1103   09/11/23 1245  metroNIDAZOLE (FLAGYL) IVPB 500 mg  Status:  Discontinued        500 mg 100 mL/hr over 60 Minutes Intravenous Every 12 hours 09/11/23 1148 09/14/23 1450   09/11/23 1245  Ampicillin-Sulbactam (UNASYN) 3 g in sodium chloride 0.9 % 100 mL IVPB  Status:  Discontinued        3 g 200 mL/hr over 30 Minutes Intravenous Every 6 hours 09/11/23 1148 09/15/23 1009   09/11/23 0600  cefoTEtan (CEFOTAN) 2 g in sodium chloride 0.9 % 100 mL IVPB        2 g 200 mL/hr over 30 Minutes Intravenous On call to O.R. 09/14/2023 2007 09/04/2023 2127   09/11/23 0000  azithromycin (ZITHROMAX) 500 mg in sodium chloride 0.9 % 250 mL IVPB  Status:  Discontinued        500 mg 250 mL/hr over 60 Minutes Intravenous Every 24 hours 09/09/2023 2330 09/11/23 1148   09/28/2023 2330  fluconazole (DIFLUCAN) IVPB 800 mg       Note to Pharmacy: Attention pharmacist: For 800 mg doses, change infusion duration to 240 min in verifcation via edit clinical information.  Placed in "Followed by" Linked Group   800 mg 100 mL/hr over 240 Minutes Intravenous  Once 09/17/2023 2318 09/11/23 0409   09/04/2023 2019  sodium chloride 0.9 % with cefoTEtan (CEFOTAN) ADS Med       Note to Pharmacy: Sherlene Shams: cabinet override      09/27/2023 2019 08/31/2023 2113   08/31/2023 1845  ceFEPIme (MAXIPIME) 2 g in sodium chloride 0.9 % 100 mL IVPB  Status:  Discontinued        2 g 200 mL/hr over 30 Minutes Intravenous Every 8 hours 08/30/2023 1840 09/11/23 1148   09/24/2023 1830  piperacillin-tazobactam (ZOSYN) IVPB 3.375 g  Status:  Discontinued        3.375 g 100 mL/hr over 30 Minutes Intravenous  Once 08/29/2023 1815 09/09/2023 1818   09/13/2023 1830  ceFEPIme (MAXIPIME) 2 g in sodium chloride 0.9 % 100 mL IVPB  Status:  Discontinued        2 g 200 mL/hr over 30 Minutes Intravenous  Once 09/15/2023 1818 09/09/2023 1840   09/15/2023 1830  metroNIDAZOLE (FLAGYL) IVPB 500 mg        500 mg 100  mL/hr over 60 Minutes Intravenous  Once 09/26/2023 1818 09/16/2023 1955       Assessment/Plan: Perforated gastric ulcer  POD 7 s/p ex-lap with graham patch 10/13 Dr. Henreitta Leber - NGT replaced 10/19, AXR c/w ileus - Monitor bowel function and JP output - path c/w ulceration without malignancy or H.pylori on specimen - mobilize as able. 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

## 2023-09-29 NOTE — Death Summary Note (Signed)
[facility], [address], [place], [place] [address]    Special Requests   Final    NONE Performed at [facility], [address] [place], [place] [address]    Gram Stain NO WBC SEEN RARE YEAST   Final   Culture   Final    RARE CANDIDA ALBICANS RARE CANDIDA GLABRATA NO ANAEROBES ISOLATED Performed at [facility], [address], [place], [place] [address]    Report Status 09/16/2023 FINAL  Final  MRSA Next Gen by PCR, Nasal     Status: None   Collection Time: October 05, 2023 11:17 PM  Result Value Ref Range Status   MRSA by PCR Next Gen NOT DETECTED NOT DETECTED Final    Comment: (NOTE) The GeneXpert MRSA Assay (FDA approved for NASAL specimens only), is one component of Clairissa Valvano comprehensive MRSA colonization surveillance program. It is not intended to diagnose MRSA infection nor to guide or monitor treatment for MRSA infections. Test performance is not FDA approved in patients less  than 15 years old. Performed at [facility], [address] [place], [place] [address]   Culture, Respiratory w Gram Stain     Status: None   Collection Time: 09/11/23 12:30 AM   Specimen: Sputum  Result Value Ref Range Status   Specimen Description SPU  Final   Special Requests NONE  Final   Gram Stain   Final    ABUNDANT WBC PRESENT, PREDOMINANTLY PMN RARE GRAM POSITIVE COCCI IN PAIRS    Culture   Final    RARE Normal respiratory flora-no Staph aureus or Pseudomonas seen Performed at [facility], [address], [place], [place] [address]    Report Status 09/15/2023 FINAL  Final    Time spent: 30 minutes  Signed: Lacretia Nicks, MD 09/15/2023  [facility], [address], [place], [place] [address]    Special Requests   Final    NONE Performed at [facility], [address] [place], [place] [address]    Gram Stain NO WBC SEEN RARE YEAST   Final   Culture   Final    RARE CANDIDA ALBICANS RARE CANDIDA GLABRATA NO ANAEROBES ISOLATED Performed at [facility], [address], [place], [place] [address]    Report Status 09/16/2023 FINAL  Final  MRSA Next Gen by PCR, Nasal     Status: None   Collection Time: October 05, 2023 11:17 PM  Result Value Ref Range Status   MRSA by PCR Next Gen NOT DETECTED NOT DETECTED Final    Comment: (NOTE) The GeneXpert MRSA Assay (FDA approved for NASAL specimens only), is one component of Clairissa Valvano comprehensive MRSA colonization surveillance program. It is not intended to diagnose MRSA infection nor to guide or monitor treatment for MRSA infections. Test performance is not FDA approved in patients less  than 15 years old. Performed at [facility], [address] [place], [place] [address]   Culture, Respiratory w Gram Stain     Status: None   Collection Time: 09/11/23 12:30 AM   Specimen: Sputum  Result Value Ref Range Status   Specimen Description SPU  Final   Special Requests NONE  Final   Gram Stain   Final    ABUNDANT WBC PRESENT, PREDOMINANTLY PMN RARE GRAM POSITIVE COCCI IN PAIRS    Culture   Final    RARE Normal respiratory flora-no Staph aureus or Pseudomonas seen Performed at [facility], [address], [place], [place] [address]    Report Status 09/15/2023 FINAL  Final    Time spent: 30 minutes  Signed: Lacretia Nicks, MD 09/15/2023  [facility], [address], [place], [place] [address]    Special Requests   Final    NONE Performed at [facility], [address] [place], [place] [address]    Gram Stain NO WBC SEEN RARE YEAST   Final   Culture   Final    RARE CANDIDA ALBICANS RARE CANDIDA GLABRATA NO ANAEROBES ISOLATED Performed at [facility], [address], [place], [place] [address]    Report Status 09/16/2023 FINAL  Final  MRSA Next Gen by PCR, Nasal     Status: None   Collection Time: October 05, 2023 11:17 PM  Result Value Ref Range Status   MRSA by PCR Next Gen NOT DETECTED NOT DETECTED Final    Comment: (NOTE) The GeneXpert MRSA Assay (FDA approved for NASAL specimens only), is one component of Clairissa Valvano comprehensive MRSA colonization surveillance program. It is not intended to diagnose MRSA infection nor to guide or monitor treatment for MRSA infections. Test performance is not FDA approved in patients less  than 15 years old. Performed at [facility], [address] [place], [place] [address]   Culture, Respiratory w Gram Stain     Status: None   Collection Time: 09/11/23 12:30 AM   Specimen: Sputum  Result Value Ref Range Status   Specimen Description SPU  Final   Special Requests NONE  Final   Gram Stain   Final    ABUNDANT WBC PRESENT, PREDOMINANTLY PMN RARE GRAM POSITIVE COCCI IN PAIRS    Culture   Final    RARE Normal respiratory flora-no Staph aureus or Pseudomonas seen Performed at [facility], [address], [place], [place] [address]    Report Status 09/15/2023 FINAL  Final    Time spent: 30 minutes  Signed: Lacretia Nicks, MD 09/15/2023  DEATH SUMMARY   Patient Details  Name: Corey Griffin MRN: [id number] DOB: 11/13/1946 [id number], Corey Hazel, MD Admission/Discharge Information   Admit Date:  09/26/23  Date of Death:   11-04-23  Time of Death:   11:20 AM  Length of Stay: 8   Principle Cause of death: acute hypoxic respiratory failure, aspiration pneumonia  Hospital Diagnoses: Principal Problem:   Acute gastric ulcer with perforation (HCC) Active Problems:   Sepsis (HCC)   Bowel perforation (HCC)   Gastric ulcer with perforation (HCC)   Acute respiratory failure (HCC)   Protein-calorie malnutrition, severe   DNR (do not resuscitate)   DNI (do not intubate)   Pleural effusion   Hospital Course:  Patient is Kelie Griffin  77 yo M w/ pertinent PMH COPD on o2 chronic pain, chronic constipation who presented to [facility] ED on 09/26/2023 w/ abd distention and pain.   CT showed wall thickening of the duodenum and gastric antrum with surrounding inflammation and free air worrisome for perforated gastric or duodenal ulcer.  He had ex lap with graham patch on 09-26-2023 with general surgery.  Remained intubated post ob and was eventually transferred to [facility] service.    Post op course complicated by acute hypoxic respiratory failure due to suspected aspiration as well as volume overload.     He was maintained on TPN post op.  Trial of clears was trated on 10/17, but NG was replaced due to abnormal KUB on 10/19.     He continued to worsen with progressive acute hypoxic respiratory failure.  He was started on [facility] on 10/18.  Despite broadened antibiotics, lasix, he's continued to decline.     With lack of improvement and continued decline post op, he's been transitioned to full comfort measures on 10/21 with imminent passing expected with recent worsening hypoxia and change in mental status.     He passed at 1120 after transition to comfort measures.    Significant Events September 26, 2023 admit to [facility] for gastric ulcer w/ free air in abd; taking to OR;  post op remains intubated transfer to [facility]; pccm consulted 10/14 Extubated [facility] assumed care on 10/16 10/17 PO trial of clears after UGI without evidence of contrast leak 10/17-18 overnight declined with increasing SOB and increasing O2 requirement -> lasix ordered 10/18 am greenish vomit, made NPO again 10/19 NG replaced 10/21 PCCM began conversations regarding comfort measures with daughter.  He declined with worsening hypoxia and has been transitioned to comfort measures  Assessment and Plan:  Goals of care Appreciate palliative and critical care assistance with discussions.  Dr. Katrinka Blazing had discussed transition to comfort measures and concern he is at end of life.  In that conversation, daughter indicated Mr. Blee would want to focus on comfort.  As I entered room today, he had desatted to the 60's with cyanosis, tachypnea, increased work of breathing, obtunded.  I discussed that Mr. Loach is at the end of life.  Based on current appearance, I suspect he has minutes to hours, probably on the shorter end of that spectrum.  Fentanyl x1 given for air hunger (discussed rationale with daughter).  I'll make it available q15.  Morphine gtt ordered, but in reality, I doubt there'll be time to start that.  Comfort orders entered.  Will d/c monitor.  His daughter Meribeth Mattes is with him at the bedside and expressed understanding.    Perforated Gastric Ulcer s/p Ex Lap with Cheree Ditto Patch S/p OR 09/26/23 with general surgery Appreciate surgery recs - NPO  [facility], [address], [place], [place] [address]    Special Requests   Final    NONE Performed at [facility], [address] [place], [place] [address]    Gram Stain NO WBC SEEN RARE YEAST   Final   Culture   Final    RARE CANDIDA ALBICANS RARE CANDIDA GLABRATA NO ANAEROBES ISOLATED Performed at [facility], [address], [place], [place] [address]    Report Status 09/16/2023 FINAL  Final  MRSA Next Gen by PCR, Nasal     Status: None   Collection Time: October 05, 2023 11:17 PM  Result Value Ref Range Status   MRSA by PCR Next Gen NOT DETECTED NOT DETECTED Final    Comment: (NOTE) The GeneXpert MRSA Assay (FDA approved for NASAL specimens only), is one component of Clairissa Valvano comprehensive MRSA colonization surveillance program. It is not intended to diagnose MRSA infection nor to guide or monitor treatment for MRSA infections. Test performance is not FDA approved in patients less  than 15 years old. Performed at [facility], [address] [place], [place] [address]   Culture, Respiratory w Gram Stain     Status: None   Collection Time: 09/11/23 12:30 AM   Specimen: Sputum  Result Value Ref Range Status   Specimen Description SPU  Final   Special Requests NONE  Final   Gram Stain   Final    ABUNDANT WBC PRESENT, PREDOMINANTLY PMN RARE GRAM POSITIVE COCCI IN PAIRS    Culture   Final    RARE Normal respiratory flora-no Staph aureus or Pseudomonas seen Performed at [facility], [address], [place], [place] [address]    Report Status 09/15/2023 FINAL  Final    Time spent: 30 minutes  Signed: Lacretia Nicks, MD 09/15/2023  [facility], [address], [place], [place] [address]    Special Requests   Final    NONE Performed at [facility], [address] [place], [place] [address]    Gram Stain NO WBC SEEN RARE YEAST   Final   Culture   Final    RARE CANDIDA ALBICANS RARE CANDIDA GLABRATA NO ANAEROBES ISOLATED Performed at [facility], [address], [place], [place] [address]    Report Status 09/16/2023 FINAL  Final  MRSA Next Gen by PCR, Nasal     Status: None   Collection Time: October 05, 2023 11:17 PM  Result Value Ref Range Status   MRSA by PCR Next Gen NOT DETECTED NOT DETECTED Final    Comment: (NOTE) The GeneXpert MRSA Assay (FDA approved for NASAL specimens only), is one component of Clairissa Valvano comprehensive MRSA colonization surveillance program. It is not intended to diagnose MRSA infection nor to guide or monitor treatment for MRSA infections. Test performance is not FDA approved in patients less  than 15 years old. Performed at [facility], [address] [place], [place] [address]   Culture, Respiratory w Gram Stain     Status: None   Collection Time: 09/11/23 12:30 AM   Specimen: Sputum  Result Value Ref Range Status   Specimen Description SPU  Final   Special Requests NONE  Final   Gram Stain   Final    ABUNDANT WBC PRESENT, PREDOMINANTLY PMN RARE GRAM POSITIVE COCCI IN PAIRS    Culture   Final    RARE Normal respiratory flora-no Staph aureus or Pseudomonas seen Performed at [facility], [address], [place], [place] [address]    Report Status 09/15/2023 FINAL  Final    Time spent: 30 minutes  Signed: Lacretia Nicks, MD 09/15/2023  [facility], [address], [place], [place] [address]    Special Requests   Final    NONE Performed at [facility], [address] [place], [place] [address]    Gram Stain NO WBC SEEN RARE YEAST   Final   Culture   Final    RARE CANDIDA ALBICANS RARE CANDIDA GLABRATA NO ANAEROBES ISOLATED Performed at [facility], [address], [place], [place] [address]    Report Status 09/16/2023 FINAL  Final  MRSA Next Gen by PCR, Nasal     Status: None   Collection Time: October 05, 2023 11:17 PM  Result Value Ref Range Status   MRSA by PCR Next Gen NOT DETECTED NOT DETECTED Final    Comment: (NOTE) The GeneXpert MRSA Assay (FDA approved for NASAL specimens only), is one component of Clairissa Valvano comprehensive MRSA colonization surveillance program. It is not intended to diagnose MRSA infection nor to guide or monitor treatment for MRSA infections. Test performance is not FDA approved in patients less  than 15 years old. Performed at [facility], [address] [place], [place] [address]   Culture, Respiratory w Gram Stain     Status: None   Collection Time: 09/11/23 12:30 AM   Specimen: Sputum  Result Value Ref Range Status   Specimen Description SPU  Final   Special Requests NONE  Final   Gram Stain   Final    ABUNDANT WBC PRESENT, PREDOMINANTLY PMN RARE GRAM POSITIVE COCCI IN PAIRS    Culture   Final    RARE Normal respiratory flora-no Staph aureus or Pseudomonas seen Performed at [facility], [address], [place], [place] [address]    Report Status 09/15/2023 FINAL  Final    Time spent: 30 minutes  Signed: Lacretia Nicks, MD 09/15/2023  [facility], [address], [place], [place] [address]    Special Requests   Final    NONE Performed at [facility], [address] [place], [place] [address]    Gram Stain NO WBC SEEN RARE YEAST   Final   Culture   Final    RARE CANDIDA ALBICANS RARE CANDIDA GLABRATA NO ANAEROBES ISOLATED Performed at [facility], [address], [place], [place] [address]    Report Status 09/16/2023 FINAL  Final  MRSA Next Gen by PCR, Nasal     Status: None   Collection Time: October 05, 2023 11:17 PM  Result Value Ref Range Status   MRSA by PCR Next Gen NOT DETECTED NOT DETECTED Final    Comment: (NOTE) The GeneXpert MRSA Assay (FDA approved for NASAL specimens only), is one component of Clairissa Valvano comprehensive MRSA colonization surveillance program. It is not intended to diagnose MRSA infection nor to guide or monitor treatment for MRSA infections. Test performance is not FDA approved in patients less  than 15 years old. Performed at [facility], [address] [place], [place] [address]   Culture, Respiratory w Gram Stain     Status: None   Collection Time: 09/11/23 12:30 AM   Specimen: Sputum  Result Value Ref Range Status   Specimen Description SPU  Final   Special Requests NONE  Final   Gram Stain   Final    ABUNDANT WBC PRESENT, PREDOMINANTLY PMN RARE GRAM POSITIVE COCCI IN PAIRS    Culture   Final    RARE Normal respiratory flora-no Staph aureus or Pseudomonas seen Performed at [facility], [address], [place], [place] [address]    Report Status 09/15/2023 FINAL  Final    Time spent: 30 minutes  Signed: Lacretia Nicks, MD 09/15/2023

## 2023-09-29 NOTE — Progress Notes (Signed)
NAME:  Corey Griffin, MRN:  [id number], DOB:  1946-01-31, LOS: 8 ADMISSION DATE:  2023/09/11, CHIEF COMPLAINT:  Respiratory Failure   History of Present Illness:   Patient is a  77 yo M w/ pertinent PMH COPD on o2(seen by Dr. Sherene Sires), chronic pain, chronic constipation presents to [facility] ED on 09-11-2023 w/ abd distention and pain.   Patient states he has had abd distention, constipation, and pain for several days. Uanble to eat or drink due to the pain. Denies any vomiting or diarrhea. Has hx of severe abd wall thinning. Concerned he has a blockage and came to [facility] on 2023-09-11 for further workup. On arrival patient tachycardic 110s, afebrile, bp stable. Abd distended w/ pain on palpation. WBC 16. Cultures obtained and started on cefepime/flagyl. UA negative. Lipase wnl. CT abd/pelvis showing free air in stomach excision for perforated gastric ulcer. Surgery consulted. Taken to OR for ex lap with gastric ulcer repair, biopsy, graham patch placement. JP drain in place. Given hx of COPD plan to keep post op intubated overnight and transfer to [facility] ICU. PCCM consulted for transfer.  Pertinent  Medical History  -COPD  Significant Hospital Events: Including procedures, antibiotic start and stop dates in addition to other pertinent events   09-11-23 admit to [facility] for gastric ulcer w/ free air in abd; taking to OR for ex lap with graham patch; post op remains intubated transfer to [facility]; pccm consulted 10/14 Extubated 10/15 transfer out of ICU 10/17 some resp distress overnight, given lasix and offered BiPAP but he refused, placed on HHFNC 40L at 40%. CXR overnight showed interstitial opacities bilaterally with possible LUL cavitation and small R effusion  Interim History / Subjective:  Less responsive.  Daughter at bedside.  RR in high 30s.  Objective   Blood pressure 109/63, pulse (!) 102, temperature 98.9 F (37.2 C), temperature source Oral, resp. rate 16, height 5\' 9"  (1.753 m), weight 66.1 kg, SpO2 94%.    FiO2  (%):  [60 %-75 %] 60 %   Intake/Output Summary (Last 24 hours) at 09/14/2023 0705 Last data filed at 09/24/2023 0500 Gross per 24 hour  Intake 1161.88 ml  Output 620 ml  Net 541.88 ml   Filed Weights   09-11-2023 1617 09/11/23 0600 09/12/23 0443  Weight: 68 kg 65.3 kg 66.1 kg    Examination: Ill appearing Tachypneic shallow breathing R lung [place]: moderate free flowing effusion estimate 1L Abd JP in place Ext muscle wasting Wakes up and briefly follows commands then falls back asleep  Assessment & Plan:  # Perforated gastric ulcer s/p ex lap with graham pouch Sep 11, 2023- ongoing issues with postop ileus # Acute on Chronic Hypoxic and Hypercapnic Respiratory Failure # Aspiration Pneumonia # Baseline severe COPD # Right Pleural Effusion- related to intra-abdominal process vs. Concurrent PNA; could probably get pleural tube in but given clinical appearance this will not change clinical trajectory # Sacral pressure ulcer POA # Frailty # Encounter for palliative care # Worsening metabolic encephalopathy # Worsening leukocytosis  Worried he is at EOL and prolonging suffering regardless of the process in his R pleural space.  Daughter who has been present with him agrees.  Baseline chronic pain and poor functional status.  Discussed what he would have wanted in such circumstances, daughter thinks would want comfort to be focus rather than attempts to prolong life at all costs. She is going to talk to family and get back with Dr. Lowell Guitar regarding how the transition to full comfort looks (I  discussed usually involves morphine drip titrated to RR<20 and no s/s of discomfort followed by titration down of oxygen as well as cessation of TPN).  Will be available if additional family needs to talk.  From my standpoint he clinically appears to be progressively deteriorating (mostly related to baseline poor health/mobility/nutrition) with pleural intervention only delaying the inevitable.  Will be available  PRN.  Discussed above with Dr. Lowell Guitar.  Rapid Response RN Scarlette Slice in room for these discussions.  Lorin Glass, MD [facility] 09/13/2023 7:05 AM

## 2023-09-29 NOTE — Progress Notes (Signed)
PHARMACY - TOTAL PARENTERAL NUTRITION CONSULT NOTE   Indication:  Gastric Ulcer Perforation  Patient Measurements: Height: 5\' 9"  (175.3 cm) Weight: 66.1 kg (145 lb 11.6 oz) IBW/kg (Calculated) : 70.7 TPN AdjBW (KG): 68 Body mass index is 21.52 kg/m. Usual Weight: 180 lbs.  Assessment:  71 YOM admitted for perforation of gastric ulcer after presenting (10/13) with several days of abdominal pain and distention which prevented him from eating or drinking except for small sips of water. Underwent exploratory laparotomy, primary repair of gastric ulcer (1.5 cm), biopsy, and graham patch at [facility] (10/13). PMHx significant for chronic constipation and thinning of abdominal wall with abdominal distention, COPD on home O2. Unable to speak with patient due to intubation.  Presumed moderate-to-high refeeding risk given temporal wasting per MD. Plan for NPO for at least 5 days post-op per team. Pharmacy consulted for TPN.  Unable to make customized TPN and utilizing Clinimix 8/10 due to IVF shortage caused by [facility] per hospital policy. Will not be able to meet full nutrition goals due to limited stock and fluid restrictions. Will replace electrolytes and give lipids outside of Clinimix.   10/16: Lytes trending down steadily, potentially refeeding, will continue to monitor.  Glucose / Insulin: No Hx of DM, A1c unknown, BG < 180 - no insulin Electrolytes: Phos up 2.4 (received 15 mmol k phos), CoCa 11.4, Mag 2.0, K up 4.1 (received ~22.5 mEq in K phos), other wnl Renal: Scr 0.57, BUN 35 (up) Hepatic: Albumin < 1.5, Tbili 0.1, AlkPhos up 172, Tbili/AST/ALT wnl, TG up 91 Intake / Output; MIVF: UOP 0.3 mL/kg/hr, JP drain 70 mL, NGT nothing charted, LBM 10/20  GI Imaging: 10/13 CT Abdomen/Pelvis: free air concerning for gastric or duodenal ulcer, likely acute cholecystitis  10/13 KUB: nonobstructive bowel gas 10/17 UGI: no contrast leak, no identifiable obstruction or small bowel  abnormality  10/19 KUB: gastric gaseous distention (free air may have similar appearance) GI Surgeries/Procedures:  10/13: Ex-lap, graham patch, JP drain and NGT placement 10/14: Extubated, propofol off  Central access: PICC 10/14 TPN start date: 10/14  Nutritional Goals: Clinimix E 8/10 1000 mL/day MW @ 42 mL/hr (provides 80 g protein and 664 kcal per day)  Clinimix E 8/10 2000 mL/day TTFriSatSun @ 82 mL/hr (provides 160 g protein and 1325 kcal per day) To provide an average of 137 g AA and 1586 Kcal per day.   RD Assessment:  Estimated Needs Total Energy Estimated Needs: 1900-2100 kcals Total Protein Estimated Needs: 110-125 g Total Fluid Estimated Needs: >/= 1.8 L  Current Nutrition:  TPN NPO  Plan:  Continue Clinimix E 8/10 at 1800: Will alternate: - per day (42 ml/hr) on M/Th - per day (67ml/hr) on Tu/Wed/Fri/Sat/Sun - Smof lipid to 250 ml daily To provide an average of 137 g AA and 1586 Kcal per day, meeting 100% of protein and ~83% of Kcal goals; lipids provide 28% of kcals. Unable to meet full nutrition requirements due to Clinimix and volume limitations.   Electrolytes:  - 1000 mL per day:  No Lytes in TPN Oct 04, 2023 - 2000 mL per day: Na 70 mEq, K 60 mEq, Mag 10 mEq, Phos 30 mmol, Acetate 166 mEq, Cl 152 mEq in Clinimix  Additional: - Kphos 15 mmol x1 IV Add to clinimix standard MVI and trace elements, no chromium due to shortage Additional IVF per team Monitor TPN labs daily while on Clinimix, monitor TG qMonday   Laqueta Jean PharmD Candidate 04-Oct-2023 10:56 AM

## 2023-09-29 NOTE — Accreditation Note (Signed)
Restraint death after removal soft wrist restraints logged on 09/27/2023 by Laurene Footman RN at [phone].

## 2023-09-29 NOTE — Progress Notes (Signed)
104 108 106  CO2 28 32 33* 31 32  GLUCOSE 115* 142* 139* 148* 164*  BUN 26* 26* 33* 33* 35*  CREATININE 0.60* 0.56* 0.62 0.55* 0.57*  CALCIUM 8.7* 9.2 8.3* 8.6* 9.4  MG 1.8 1.9 1.9 2.0 2.0  PHOS 2.6 2.8 2.5 1.5* 2.4*    GFR: Estimated Creatinine Clearance: 71.1 mL/min ([employer]) (by C-G formula based on SCr of 0.57 mg/dL (L)).  Liver Function Tests: Recent Labs  Lab 09/14/23 1610 09/15/23 0601 09/16/23 0506 October 01, 2023 0500  AST 19 19 19 23   ALT 16 19 19 22   ALKPHOS 111 135* 163* 172*  BILITOT 0.1* 0.6 0.1* 0.4  PROT 5.3* 5.7* 5.3* 5.7*  ALBUMIN <1.5* <1.5* <1.5* <1.5*    CBG: Recent Labs  Lab 09/17/23 0757 09/17/23 1250 09/17/23 1707 09/17/23 2145 2023-10-01 0740  GLUCAP 144* 141* 133* 144* 159*     Recent Results (from the past 240 hour(s))  Blood culture (routine x 2)     Status: None   Collection Time: 09/06/2023  6:09 PM   Specimen: Left Antecubital; Blood  Result Value Ref Range Status   Specimen Description LEFT ANTECUBITAL BLOOD  Final   Special Requests   Final    BOTTLES DRAWN AEROBIC AND ANAEROBIC Blood Culture results may not be optimal due to an excessive volume of blood received in culture bottles   Culture   Final    NO GROWTH 5 DAYS Performed at [facility], [address], [place], [place] [address]    Report Status 09/15/2023 FINAL  Final  Blood culture (routine x 2)     Status: None   Collection Time: 09/19/2023  6:22 PM   Specimen: BLOOD LEFT HAND  Result Value Ref Range Status   Specimen Description BLOOD LEFT HAND BLOOD  Final   Special Requests   Final    BOTTLES DRAWN AEROBIC AND ANAEROBIC Blood Culture results may not be optimal due to an excessive volume of blood received in culture bottles   Culture   Final    NO GROWTH 5 DAYS Performed at [facility], [address] [place], [place] [address]    Report Status 09/15/2023 FINAL  Final  SARS Coronavirus 2 by RT PCR (hospital order, performed in [facility] hospital lab)  *cepheid single result test*     Status: None   Collection Time: 09/20/2023  7:48 PM  Result Value Ref Range Status   SARS Coronavirus 2 by RT PCR NEGATIVE NEGATIVE Final    Comment: (NOTE) SARS-CoV-2 target nucleic acids are NOT DETECTED.  The SARS-CoV-2 RNA is generally detectable in upper and lower respiratory specimens during the acute phase of infection. The lowest concentration of SARS-CoV-2 viral copies this assay can detect is 250 copies / mL. Oziah Vitanza negative result does not preclude SARS-CoV-2 infection and should not be used as the sole basis for treatment or other patient management decisions.  Kevina Piloto negative result may occur with improper specimen collection / handling, submission of specimen other than nasopharyngeal swab, presence of viral mutation(s) within the areas targeted by this assay, and inadequate number of viral copies (<250 copies / mL). Orlie Cundari negative result must be combined with clinical observations, patient history, and epidemiological information.  Fact Sheet for Patients:   RoadLapTop.co.za  Fact Sheet for Healthcare Providers: http://kim-miller.com/  This test is not yet approved or  cleared by the [place] FDA and has been authorized for detection and/or diagnosis of SARS-CoV-2 by FDA under an Emergency Use Authorization (EUA).  call to O.R. 09-15-23 2007 15-Sep-2023 2127   09/11/23 0000  azithromycin (ZITHROMAX) 500 mg in sodium chloride 0.9 % 250 mL IVPB  Status:  Discontinued        500 mg 250 mL/hr over 60 Minutes Intravenous Every 24 hours 09-15-23 2330 09/11/23 1148   09/15/23 2330  fluconazole (DIFLUCAN) IVPB 800 mg       Note to Pharmacy: Attention pharmacist: For 800 mg doses, change infusion duration to 240 min in verifcation via edit clinical information.  Placed in "Followed by" Linked Group   800 mg 100 mL/hr over 240 Minutes Intravenous  Once 09-15-2023 2318 09/11/23 0409   09/15/2023 2019  sodium chloride 0.9 % with cefoTEtan (CEFOTAN) ADS Med       Note to Pharmacy: Sherlene Shams: cabinet override      09-15-2023 2019 09/15/2023 2113   September 15, 2023 1845  ceFEPIme (MAXIPIME) 2 g in sodium chloride 0.9 % 100 mL IVPB  Status:  Discontinued        2 g 200 mL/hr over 30 Minutes Intravenous Every 8 hours 2023-09-15 1840 09/11/23 1148   September 15, 2023 1830  piperacillin-tazobactam  (ZOSYN) IVPB 3.375 g  Status:  Discontinued        3.375 g 100 mL/hr over 30 Minutes Intravenous  Once 09/15/2023 1815 September 15, 2023 1818   September 15, 2023 1830  ceFEPIme (MAXIPIME) 2 g in sodium chloride 0.9 % 100 mL IVPB  Status:  Discontinued        2 g 200 mL/hr over 30 Minutes Intravenous  Once September 15, 2023 1818 09-15-23 1840   09-15-2023 1830  metroNIDAZOLE (FLAGYL) IVPB 500 mg        500 mg 100 mL/hr over 60 Minutes Intravenous  Once 09/15/2023 1818 09-15-23 1955       Subjective: C/o pain with movement Breathing is the same  Abdomen better with NG Daughter at bedside  Objective: Vitals:   09/17/23 2337 09/04/2023 0313 09/09/2023 0335 09/01/2023 0737  BP: 115/64  109/63   Pulse: (!) 108 (!) 104 (!) 102   Resp: (!) 26 (!) 26 16   Temp: 98.1 F (36.7 C)  98.9 F (37.2 C)   TempSrc: Oral  Oral Axillary  SpO2: 93% 93% 94%   Weight:      Height:        Intake/Output Summary (Last 24 hours) at 09/19/2023 1053 Last data filed at 09/09/2023 0500 Gross per 24 hour  Intake 1161.88 ml  Output 620 ml  Net 541.88 ml   Filed Weights   09/15/2023 1617 09/11/23 0600 09/12/23 0443  Weight: 68 kg 65.3 kg 66.1 kg    Examination:  General: No acute distress. Cardiovascular: RRR Lungs: diminished, tachypneic on HHFNC  Abdomen: distended, nontender - NG in place Neurological: Alert.  Moving all extremities.  Extremities: dependent edema noted to hip    Data Reviewed: I have personally reviewed following labs and imaging studies  CBC: Recent Labs  Lab 09/12/23 0949 09/14/23 0842 09/15/23 0601 09/17/23 0925 09/28/2023 0500  WBC 24.6* 26.0* 23.3* 26.8* 28.8*  NEUTROABS  --  23.1* 20.3*  --   --   HGB 9.6* 9.4* 10.4* 9.5* 10.0*  HCT 30.6* 30.8* 33.6* 32.1* 34.2*  MCV 100.0 100.7* 102.8* 104.6* 106.2*  PLT 499* 503* 510* 415* 478*    Basic Metabolic Panel: Recent Labs  Lab 09/14/23 0842 09/15/23 0601 09/16/23 0506 09/17/23 0925 09/12/2023 0500  NA 141 143 143 143 142  K 3.8 3.5 4.2 3.8  4.1  CL 107 103  This EUA will remain in effect (meaning this test can be used) for the duration of the COVID-19 declaration under Section 564(b)(1) of the Act, 21 U.S.C. section 360bbb-3(b)(1), unless the authorization is terminated or revoked sooner.  Performed at [facility], [address] [place], [place] [address]   Aerobic/Anaerobic Culture w Gram Stain (surgical/deep wound)     Status: None   Collection Time: 09/28/23  8:56 PM   Specimen: Path fluid; Tissue  Result Value Ref Range Status   Specimen Description   Final    FLUID Performed at [facility], [address], [place], [place]  [address]    Special Requests   Final    NONE Performed at [facility], [address], [place], [place] [address]    Gram Stain NO WBC SEEN RARE YEAST   Final   Culture   Final    RARE CANDIDA ALBICANS RARE CANDIDA GLABRATA NO ANAEROBES ISOLATED Performed at [facility], [address] [place], [place] [address]    Report Status 09/16/2023 FINAL  Final  MRSA Next Gen by PCR, Nasal     Status: None   Collection Time: September 28, 2023 11:17 PM  Result Value Ref Range Status   MRSA by PCR Next Gen NOT DETECTED NOT DETECTED Final    Comment: (NOTE) The GeneXpert MRSA Assay (FDA approved for NASAL specimens only), is one component of [facility]ion Shankles comprehensive MRSA colonization surveillance program. It is not intended to diagnose MRSA infection nor to guide or monitor treatment for MRSA infections. Test performance is not FDA approved in patients less than 1 years old. Performed at [facility], [address], [place], [place] [address]   Culture, Respiratory w Gram Stain     Status: None   Collection Time: 09/11/23 12:30 AM   Specimen: Sputum  Result Value Ref Range Status   Specimen Description SPU  Final   Special Requests NONE  Final   Gram Stain   Final    ABUNDANT WBC PRESENT, PREDOMINANTLY PMN RARE GRAM POSITIVE COCCI IN PAIRS    Culture   Final    RARE Normal respiratory flora-no Staph aureus or Pseudomonas seen Performed at [facility], [address] [place], [place] [address]    Report Status 09/15/2023 FINAL  Final         Radiology Studies: DG Abd 1 View  Result Date: 09/16/2023 CLINICAL DATA:  Abdominal distension.  NG tube placement. EXAM: ABDOMEN - 1 VIEW COMPARISON:  Earlier today FINDINGS: Two portable supine views of the abdomen obtained. Repositioning of the enteric tube, second image demonstrates the tip and side-port below the diaphragm in the stomach. There is contrast in the colon from prior upper GI. Drain in the upper abdomen. Air within  the central upper abdomen may represent gaseous gastric distension, however free air could have Laurinda Carreno similar appearance. The amount of air is greater than would be expected 6 days post laparotomy. Skin staples in place. IMPRESSION: 1. Repositioning of the enteric tube, tip and side-port are below the diaphragm in the stomach. 2. Air within the central upper abdomen may represent gaseous gastric distension, however free air could have Jonell Brumbaugh similar appearance. CT would be the study of choice for further assessment, however CT would be of likely limited value this time due to the significant dense contrast in the colon which would cause severe streak artifact and limit assessment. Continued clinical follow-up recommended. Electronically Signed   By: Ivette Loyal.D.  call to O.R. 09-15-23 2007 15-Sep-2023 2127   09/11/23 0000  azithromycin (ZITHROMAX) 500 mg in sodium chloride 0.9 % 250 mL IVPB  Status:  Discontinued        500 mg 250 mL/hr over 60 Minutes Intravenous Every 24 hours 09-15-23 2330 09/11/23 1148   09/15/23 2330  fluconazole (DIFLUCAN) IVPB 800 mg       Note to Pharmacy: Attention pharmacist: For 800 mg doses, change infusion duration to 240 min in verifcation via edit clinical information.  Placed in "Followed by" Linked Group   800 mg 100 mL/hr over 240 Minutes Intravenous  Once 09-15-2023 2318 09/11/23 0409   09/15/2023 2019  sodium chloride 0.9 % with cefoTEtan (CEFOTAN) ADS Med       Note to Pharmacy: Sherlene Shams: cabinet override      09-15-2023 2019 09/15/2023 2113   September 15, 2023 1845  ceFEPIme (MAXIPIME) 2 g in sodium chloride 0.9 % 100 mL IVPB  Status:  Discontinued        2 g 200 mL/hr over 30 Minutes Intravenous Every 8 hours 2023-09-15 1840 09/11/23 1148   September 15, 2023 1830  piperacillin-tazobactam  (ZOSYN) IVPB 3.375 g  Status:  Discontinued        3.375 g 100 mL/hr over 30 Minutes Intravenous  Once 09/15/2023 1815 September 15, 2023 1818   September 15, 2023 1830  ceFEPIme (MAXIPIME) 2 g in sodium chloride 0.9 % 100 mL IVPB  Status:  Discontinued        2 g 200 mL/hr over 30 Minutes Intravenous  Once September 15, 2023 1818 09-15-23 1840   09-15-2023 1830  metroNIDAZOLE (FLAGYL) IVPB 500 mg        500 mg 100 mL/hr over 60 Minutes Intravenous  Once 09/15/2023 1818 09-15-23 1955       Subjective: C/o pain with movement Breathing is the same  Abdomen better with NG Daughter at bedside  Objective: Vitals:   09/17/23 2337 09/04/2023 0313 09/09/2023 0335 09/01/2023 0737  BP: 115/64  109/63   Pulse: (!) 108 (!) 104 (!) 102   Resp: (!) 26 (!) 26 16   Temp: 98.1 F (36.7 C)  98.9 F (37.2 C)   TempSrc: Oral  Oral Axillary  SpO2: 93% 93% 94%   Weight:      Height:        Intake/Output Summary (Last 24 hours) at 09/19/2023 1053 Last data filed at 09/09/2023 0500 Gross per 24 hour  Intake 1161.88 ml  Output 620 ml  Net 541.88 ml   Filed Weights   09/15/2023 1617 09/11/23 0600 09/12/23 0443  Weight: 68 kg 65.3 kg 66.1 kg    Examination:  General: No acute distress. Cardiovascular: RRR Lungs: diminished, tachypneic on HHFNC  Abdomen: distended, nontender - NG in place Neurological: Alert.  Moving all extremities.  Extremities: dependent edema noted to hip    Data Reviewed: I have personally reviewed following labs and imaging studies  CBC: Recent Labs  Lab 09/12/23 0949 09/14/23 0842 09/15/23 0601 09/17/23 0925 09/28/2023 0500  WBC 24.6* 26.0* 23.3* 26.8* 28.8*  NEUTROABS  --  23.1* 20.3*  --   --   HGB 9.6* 9.4* 10.4* 9.5* 10.0*  HCT 30.6* 30.8* 33.6* 32.1* 34.2*  MCV 100.0 100.7* 102.8* 104.6* 106.2*  PLT 499* 503* 510* 415* 478*    Basic Metabolic Panel: Recent Labs  Lab 09/14/23 0842 09/15/23 0601 09/16/23 0506 09/17/23 0925 09/12/2023 0500  NA 141 143 143 143 142  K 3.8 3.5 4.2 3.8  4.1  CL 107 103  Helicobacter stain negative.  Negative for dysplasia and carcinoma).  Culture with rare candida albicans, candida glabrata.   TPN and abx dc'd with comfort plan above    Acute Hypoxic Respiratory Failure  Due to aspiration pneumonia and volume overload as noted below   Aspiration Pneumonia Right Pleural Effusion COPD  Currently on 40 L HHFNC, 62% FiO2 Broaden to cefepime/flagyl with decline CXR 23-Sep-2023 with  increasing R base atelectasis or infiltrate  Follow, continue brovana, pulmicort, yuperlri and prn albuterol  On spiriva at home Respiratory culture with normal resp flora (10/14) Negative MRSA PCR No clear wheezing on exam to suggest addition of steroids at this point Appreciate pulm assistance -> recommending consider CT chest (deferred due to tenuous resp status on HHFNC),considering thora, but based on his decline, planning for comfort measures - appreciate Dr. Michaelle Copas discussion this AM   Volume Overload  Pulmonary Edema CXR with bilateral lower zonal interstitial and patchy hazy opacities with small layering R effusion (edema vs pneumonia) Clinically overloaded with dependent hip edema  Lasix d/c'd with plan for comfort Follow echo -> EF 55-60%  Strict I/O (net positive 2 L), daily weights   Left Upper Lobe Opacities Suspected 2.6 cm Cavity  Unclear etiology Will consider CT scan  Will defer to pulm pulmonology   Leukocytosis Due to above  Rare candida albicans, candida glabrata, rare normal resp flora on resp culture  BC NGTD 09/23/2023)  Abx as above   Sinus Tachycardia Due to pain, above processes Will trend    Severe Protein Calorie Malnutrition Hypophosphatemia  Hypomagnesemia  Hypokalemia Chronic Pain BPH  Stage III decub     DVT prophylaxis: lovenox Code Status: DNR Family Communication: daughter at bedside 10/20 Disposition:   Status is: Inpatient Remains inpatient appropriate because: pending improvement   Consultants:  PCCM Surgery  Procedures:  Sep 23, 2023  Exploratory laparotomy, primary repair  gastric ulcer (1.5cm), biopsy of ulcer edge, vascularized graham patch (omental patch)   Antimicrobials:  Anti-infectives (From admission, onward)    Start     Dose/Rate Route Frequency Ordered Stop   09/15/23 1115  ceFEPIme (MAXIPIME) 2 g in sodium chloride 0.9 % 100 mL IVPB        2 g 200 mL/hr over 30 Minutes Intravenous Every 8 hours 09/15/23 1027      09/15/23 1100  metroNIDAZOLE (FLAGYL) IVPB 500 mg        500 mg 100 mL/hr over 60 Minutes Intravenous Every 12 hours 09/15/23 1009     09/11/23 2345  fluconazole (DIFLUCAN) IVPB 400 mg       Note to Pharmacy: Attention pharmacist: For 800 mg doses, change infusion duration to 240 min in verifcation via edit clinical information.  Placed in "Followed by" Linked Group   400 mg 100 mL/hr over 120 Minutes Intravenous Every 24 hours 09/23/23 2318     09/11/23 1245  metroNIDAZOLE (FLAGYL) IVPB 500 mg  Status:  Discontinued        500 mg 100 mL/hr over 60 Minutes Intravenous Every 12 hours 09/11/23 1148 09/14/23 1450   09/11/23 1245  Ampicillin-Sulbactam (UNASYN) 3 g in sodium chloride 0.9 % 100 mL IVPB  Status:  Discontinued        3 g 200 mL/hr over 30 Minutes Intravenous Every 6 hours 09/11/23 1148 09/15/23 1009   09/11/23 0600  cefoTEtan (CEFOTAN) 2 g in sodium chloride 0.9 % 100 mL IVPB        2 g 200 mL/hr over 30 Minutes Intravenous On  This EUA will remain in effect (meaning this test can be used) for the duration of the COVID-19 declaration under Section 564(b)(1) of the Act, 21 U.S.C. section 360bbb-3(b)(1), unless the authorization is terminated or revoked sooner.  Performed at [facility], [address] [place], [place] [address]   Aerobic/Anaerobic Culture w Gram Stain (surgical/deep wound)     Status: None   Collection Time: 09/28/23  8:56 PM   Specimen: Path fluid; Tissue  Result Value Ref Range Status   Specimen Description   Final    FLUID Performed at [facility], [address], [place], [place]  [address]    Special Requests   Final    NONE Performed at [facility], [address], [place], [place] [address]    Gram Stain NO WBC SEEN RARE YEAST   Final   Culture   Final    RARE CANDIDA ALBICANS RARE CANDIDA GLABRATA NO ANAEROBES ISOLATED Performed at [facility], [address] [place], [place] [address]    Report Status 09/16/2023 FINAL  Final  MRSA Next Gen by PCR, Nasal     Status: None   Collection Time: September 28, 2023 11:17 PM  Result Value Ref Range Status   MRSA by PCR Next Gen NOT DETECTED NOT DETECTED Final    Comment: (NOTE) The GeneXpert MRSA Assay (FDA approved for NASAL specimens only), is one component of [facility] Shankles comprehensive MRSA colonization surveillance program. It is not intended to diagnose MRSA infection nor to guide or monitor treatment for MRSA infections. Test performance is not FDA approved in patients less than 1 years old. Performed at [facility], [address], [place], [place] [address]   Culture, Respiratory w Gram Stain     Status: None   Collection Time: 09/11/23 12:30 AM   Specimen: Sputum  Result Value Ref Range Status   Specimen Description SPU  Final   Special Requests NONE  Final   Gram Stain   Final    ABUNDANT WBC PRESENT, PREDOMINANTLY PMN RARE GRAM POSITIVE COCCI IN PAIRS    Culture   Final    RARE Normal respiratory flora-no Staph aureus or Pseudomonas seen Performed at [facility], [address] [place], [place] [address]    Report Status 09/15/2023 FINAL  Final         Radiology Studies: DG Abd 1 View  Result Date: 09/16/2023 CLINICAL DATA:  Abdominal distension.  NG tube placement. EXAM: ABDOMEN - 1 VIEW COMPARISON:  Earlier today FINDINGS: Two portable supine views of the abdomen obtained. Repositioning of the enteric tube, second image demonstrates the tip and side-port below the diaphragm in the stomach. There is contrast in the colon from prior upper GI. Drain in the upper abdomen. Air within  the central upper abdomen may represent gaseous gastric distension, however free air could have Laurinda Carreno similar appearance. The amount of air is greater than would be expected 6 days post laparotomy. Skin staples in place. IMPRESSION: 1. Repositioning of the enteric tube, tip and side-port are below the diaphragm in the stomach. 2. Air within the central upper abdomen may represent gaseous gastric distension, however free air could have Jonell Brumbaugh similar appearance. CT would be the study of choice for further assessment, however CT would be of likely limited value this time due to the significant dense contrast in the colon which would cause severe streak artifact and limit assessment. Continued clinical follow-up recommended. Electronically Signed   By: Ivette Loyal.D.

## 2023-09-29 NOTE — Progress Notes (Signed)
Chaplain responded to [facility] page by pt RN for support for pt's daughter Corey Griffin after pt death. Chaplain introduced spiritual care and offered support Chaplain asked open ended questions to facilitate emotional expression and story telling. Corey Griffin shared that she has been here with Corey Griffin for almost a week. She is sad about her father's death, but was recently told that he needed surgery and that he would likely need to move to a nursing home after, which she reports he would not have wanted. Corey Griffin is the sole surviving member of her immediate family. Chaplain utilized reflective listening to identify challenges as well as sources of strength and coping strategies. Corey Griffin has done significant grief work and developed tools in the midst of those losses which will be beneficial to her in this season of life. Chaplain also provided grief education related to various grief responses when relationships are fraught. Chaplain affirmed pt instincts and resources and encouraged her to practice good self care. She plans to return to [place] to rest and be with her husband prior to making funeral arrangements.  Please page as further needs arise.  [name]. Carley Hammed, M.Div. [facility] Chaplain Pager [phone] Office [phone]     09/09/2023 1143  Spiritual Encounters  Type of Visit Initial  Care provided to: Family  Referral source Nurse (RN/NT/LPN)  Reason for visit Patient death  OnCall Visit Yes  Spiritual Framework  Presenting Themes Coping tools;Impactful experiences and emotions;Community and relationships  Community/Connection Friend(s);Family;Significant other  Family Stress Factors Loss  Interventions  Spiritual Care Interventions Made Established relationship of care and support;Compassionate presence;Reflective listening;Normalization of emotions;Reconciliation with self/others;Self-care teaching;Encouragement;Supported grief process;Bereavement/grief support  Intervention Outcomes  Outcomes Reduced  isolation  Spiritual Care Plan  Spiritual Care Issues Still Outstanding No further spiritual care needs at this time (see row info)

## 2023-09-29 DEATH — deceased
